# Patient Record
Sex: Male | Born: 1950 | Race: White | Hispanic: No | Marital: Married | Smoking: Never smoker
Health system: Southern US, Community
[De-identification: ages and names within clinical notes are randomized; demographics above are authoritative.]

## PROBLEM LIST (undated history)

## (undated) DIAGNOSIS — K5790 Diverticulosis of intestine, part unspecified, without perforation or abscess without bleeding: Secondary | ICD-10-CM

## (undated) DIAGNOSIS — N401 Enlarged prostate with lower urinary tract symptoms: Secondary | ICD-10-CM

## (undated) DIAGNOSIS — Z8619 Personal history of other infectious and parasitic diseases: Secondary | ICD-10-CM

## (undated) DIAGNOSIS — R55 Syncope and collapse: Secondary | ICD-10-CM

## (undated) DIAGNOSIS — K649 Unspecified hemorrhoids: Secondary | ICD-10-CM

## (undated) DIAGNOSIS — K760 Fatty (change of) liver, not elsewhere classified: Secondary | ICD-10-CM

## (undated) DIAGNOSIS — F419 Anxiety disorder, unspecified: Secondary | ICD-10-CM

## (undated) DIAGNOSIS — C187 Malignant neoplasm of sigmoid colon: Secondary | ICD-10-CM

## (undated) DIAGNOSIS — L570 Actinic keratosis: Secondary | ICD-10-CM

## (undated) DIAGNOSIS — R7303 Prediabetes: Secondary | ICD-10-CM

## (undated) DIAGNOSIS — F329 Major depressive disorder, single episode, unspecified: Secondary | ICD-10-CM

## (undated) DIAGNOSIS — I10 Essential (primary) hypertension: Secondary | ICD-10-CM

## (undated) HISTORY — PX: ELBOW FRACTURE SURGERY: SHX616

## (undated) HISTORY — PX: WISDOM TOOTH EXTRACTION: SHX21

## (undated) HISTORY — DX: Actinic keratosis: L57.0

## (undated) HISTORY — PX: RETINAL DETACHMENT SURGERY: SHX105

## (undated) HISTORY — PX: COLON SURGERY: SHX602

## (undated) HISTORY — PX: LAPAROSCOPIC BOWEL RESECTION: SUR754

## (undated) HISTORY — PX: DIAGNOSTIC LAPAROSCOPY: SUR761

## (undated) HISTORY — DX: Major depressive disorder, single episode, unspecified: F32.9

## (undated) HISTORY — DX: Essential (primary) hypertension: I10

## (undated) HISTORY — PX: EYE SURGERY: SHX253

## (undated) HISTORY — DX: Malignant neoplasm of sigmoid colon: C18.7

## (undated) HISTORY — PX: TONSILLECTOMY: SUR1361

---

## 2007-07-18 ENCOUNTER — Ambulatory Visit: Payer: Self-pay | Admitting: Family Medicine

## 2007-11-12 ENCOUNTER — Ambulatory Visit: Payer: Self-pay | Admitting: Unknown Physician Specialty

## 2008-01-29 DIAGNOSIS — H3322 Serous retinal detachment, left eye: Secondary | ICD-10-CM

## 2008-01-29 HISTORY — DX: Serous retinal detachment, left eye: H33.22

## 2008-10-24 HISTORY — PX: RETINAL DETACHMENT SURGERY: SHX105

## 2008-10-25 ENCOUNTER — Ambulatory Visit: Payer: Self-pay | Admitting: Ophthalmology

## 2009-01-28 HISTORY — PX: CATARACT EXTRACTION W/ INTRAOCULAR LENS IMPLANT: SHX1309

## 2010-01-31 ENCOUNTER — Ambulatory Visit: Payer: Self-pay | Admitting: Ophthalmology

## 2013-11-24 DIAGNOSIS — F32A Depression, unspecified: Secondary | ICD-10-CM | POA: Insufficient documentation

## 2013-11-24 DIAGNOSIS — I152 Hypertension secondary to endocrine disorders: Secondary | ICD-10-CM | POA: Insufficient documentation

## 2013-11-24 DIAGNOSIS — F329 Major depressive disorder, single episode, unspecified: Secondary | ICD-10-CM | POA: Insufficient documentation

## 2013-11-24 DIAGNOSIS — I10 Essential (primary) hypertension: Secondary | ICD-10-CM | POA: Insufficient documentation

## 2013-11-24 HISTORY — DX: Essential (primary) hypertension: I10

## 2013-11-24 HISTORY — DX: Depression, unspecified: F32.A

## 2014-01-28 HISTORY — PX: COLONOSCOPY: SHX174

## 2014-02-24 DIAGNOSIS — Z860100 Personal history of colon polyps, unspecified: Secondary | ICD-10-CM

## 2014-02-24 HISTORY — DX: Personal history of colon polyps, unspecified: Z86.0100

## 2014-02-24 HISTORY — DX: Personal history of colonic polyps: Z86.010

## 2014-03-08 ENCOUNTER — Ambulatory Visit: Payer: Self-pay | Admitting: Gastroenterology

## 2014-04-12 ENCOUNTER — Ambulatory Visit: Payer: Self-pay | Admitting: Surgery

## 2014-04-19 ENCOUNTER — Inpatient Hospital Stay: Payer: Self-pay | Admitting: Surgery

## 2014-05-23 LAB — SURGICAL PATHOLOGY

## 2014-05-29 NOTE — Op Note (Signed)
PATIENT NAME:  Matthew Webster, WADDILL MR#:  [id number] DATE OF BIRTH:  10-01-1950  DATE OF PROCEDURE:  04/19/2014  PREOPERATIVE DIAGNOSIS: Sigmoid colon polyp.   POSTOPERATIVE DIAGNOSIS: Sigmoid colon polyp.  PROCEDURE: Sigmoid colectomy.   SURGEON: Rochel Brome, M.D.   ANESTHESIA: General.   INDICATIONS: This 64 year old male recently had colonoscopy findings of a large polyp of the sigmoid colon. An attempt was made to remove with snare and cautery, but could not be removed that way due to large size of stalk and positioning, and surgery was recommended for definitive treatment.   DESCRIPTION OF PROCEDURE: The patient was placed on the operating table in the supine position under general endotracheal anesthesia. The circulating nurse inserted a Foley urinary catheter with Betadine preparation of the penis draining clear yellow urine. The legs were elevated into the lithotomy position using bumblebee stirrups. The digital rectal exam demonstrated no mass or feces within the rectum. The abdomen was clipped. The abdomen was prepared with ChloraPrep and the perineum was prepared with Betadine solution. The abdomen was draped in a sterile manner.   A lower abdominal midline incision was made, carried down through the subcutaneous tissues to the deep fascia where the fascia was incised in the midline from the umbilicus to the pubic symphysis.  I noted a small umbilical defect and reduced herniated properitoneal fat, and closed the umbilical defect with a 0 Maxon figure-of-eight suture. Next, the peritoneum was incised and opened the peritoneal cavity. The visible intestines appeared typical. The appendix appeared normal. There was a palpable mass in the mid aspect of the sigmoid colon. There was tattooing effect of the bowel at this site. The mass itself was approximately 1.8 cm in dimension according to palpation and was partially mobile within the segment of bowel.  The sigmoid colon was mobilized with  incision of the lateral peritoneal reflection and also identified the left ureter.  The points for proximal and distal margins of resection were selected so that the distal margin was approximately 3 inches and the proximal margin was approximately 3 inches. Windows were created in the mesentery.  The mesenteric dissection was carried out with the Harmonic scalpel. Also, proximally the marginal artery was suture ligated with 2-0 chromic and also a major branch of the inferior mesenteric artery was ligated with 0 chromic suture ligature. The mesenteric dissection was completed.  The 0 chromic stitch was placed in the proximal end of the specimen for the pathologist's orientation. The bowel was divided proximally and distally with electrocautery and the specimen passed off the table. The edges of the remaining bowel were held with Allis clamps. The anastomosis was carried out with triangulation technique of application of the GQ-67 stapler which was first placed along the mesenteric border of the bowel and staples were internal.  Next, the anastomosis was completed with 2 more loads of the TA-30 stapler which were placed externally to complete the anastomosis. The anastomosis did appear to be widely patent. There was one point where there was a small fracture of the muscularis propria which was then repaired with a single 4-0 Vicryl figure-of-eight suture. The mesenteric defect was closed with running 4-0 Vicryl. It was noted that during the course of the procedure a small amount of blood was aspirated. Total blood loss for the case was approximately 40 mL and at the end of the case hemostasis was intact. The site was irrigated with warm saline solution. Next, gowns, gloves, instruments, and towels were exchanged for clean ones. The omentum  was brought beneath the wound. Further inspection revealed hemostasis was intact. The peritoneum was closed with a running 2-0 chromic and the fascia was closed with interrupted 0  Maxon figure-of-eight sutures. The skin was closed with clips and dressings were applied with paper tape using 4 x 4 cotton gauze.    The patient appeared to be in satisfactory condition. The Foley catheter was removed and the patient was prepared for transfer to the recovery room.   ____________________________ Lenna Sciara. Rochel Brome, MD jws:sp D: 04/19/2014 16:58:53 ET T: 04/19/2014 17:23:27 ET JOB#: [id number]  cc: Loreli Dollar, MD, <Dictator> Loreli Dollar MD ELECTRONICALLY SIGNED 04/21/2014 10:28

## 2014-05-29 NOTE — Discharge Summary (Signed)
PATIENT NAME:  Matthew Webster, Matthew Webster MR#:  [id number] DATE OF BIRTH:  10-12-1950  DATE OF ADMISSION:  04/19/2014 DATE OF DISCHARGE:  04/23/2014  HISTORY OF PRESENT ILLNESS: This 64 year old male has a history of colonoscopy findings of a large polyp in the sigmoid colon, which could not be removed with the colonoscope and surgery was recommended for definitive treatment. The patient did have a preoperative bowel preparation at home and came in through the outpatient surgery department prepared for sigmoid colectomy.   PAST MEDICAL HISTORY INCLUDES: Hypertension, depression, fatty liver disease, diverticulosis, glaucoma.   Details are recorded on the typed H and Nelson: The patient did have a preoperative prophylactic antibiotic and was carried to the operating room and had a sigmoid colectomy. Postoperatively, he was treated with IV fluids and subcutaneous prophylactic heparin. He was initially begun on a clear liquid diet, which he tolerated and was advanced to full liquids and eventually advanced to solid food. He did demonstrate bowel function prior to discharge.   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 Adenocarcinoma invaded into the submucosa of the polyp head and the upper half of the stalk. There was no involvement of the bowel wall. There was no cancer found in 13 regional mesenteric lymph nodes. Margins were uninvolved.   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   OPERATION: Sigmoid colectomy.   Discharge instructions were given and plans made for follow-up in the office.     ____________________________ J. Rochel Brome, MD jws:mw D: 05/07/2014 10:10:19 ET T: 05/07/2014 10:31:32 ET JOB#: [id number]  cc: Loreli Dollar, MD, <Dictator> Loreli Dollar MD ELECTRONICALLY SIGNED 05/11/2014 18:12

## 2014-06-20 DIAGNOSIS — N138 Other obstructive and reflux uropathy: Secondary | ICD-10-CM | POA: Insufficient documentation

## 2014-06-20 DIAGNOSIS — N401 Enlarged prostate with lower urinary tract symptoms: Secondary | ICD-10-CM

## 2014-06-20 DIAGNOSIS — C187 Malignant neoplasm of sigmoid colon: Secondary | ICD-10-CM | POA: Insufficient documentation

## 2014-06-20 HISTORY — DX: Malignant neoplasm of sigmoid colon: C18.7

## 2014-08-31 LAB — PLATELET COUNT: Platelet: 241 10*3/uL (ref 150–440)

## 2015-07-21 ENCOUNTER — Other Ambulatory Visit: Payer: Self-pay

## 2015-08-23 ENCOUNTER — Ambulatory Visit: Payer: Self-pay | Admitting: Urology

## 2015-09-20 ENCOUNTER — Ambulatory Visit: Payer: Self-pay | Admitting: Urology

## 2015-10-11 ENCOUNTER — Ambulatory Visit (INDEPENDENT_AMBULATORY_CARE_PROVIDER_SITE_OTHER): Payer: Medicare Other | Admitting: Urology

## 2015-10-11 ENCOUNTER — Encounter: Payer: Self-pay | Admitting: Urology

## 2015-10-11 VITALS — BP 155/108 | HR 69 | Ht 65.0 in | Wt 172.0 lb

## 2015-10-11 DIAGNOSIS — R972 Elevated prostate specific antigen [PSA]: Secondary | ICD-10-CM | POA: Diagnosis not present

## 2015-10-11 DIAGNOSIS — R351 Nocturia: Secondary | ICD-10-CM

## 2015-10-11 NOTE — Progress Notes (Signed)
10/11/2015 5:02 PM   Matthew Webster 1950-03-17 [id number]  Referring provider: No referring provider defined for this encounter.  Chief Complaint  Patient presents with  . Elevated PSA    HPI: 65 yo M with BPH, elevated PSA.  He is currently on Flomax which has helped sigfnicatnly with his urination.  Prior to this medication, he had weak stream, post void dribbling and incomplete bladder emptying.  Since starting this medication, his symptoms are improved inefficiently.  He does get up 2-3x nightly which is bothersome.  He does snore, never had sleep study.    S/p negative prostate biopsy in 04/2013 at which time his PSA was 5.0 ng/DL.  TRUS volume 19 gm.  No family history of prostate cancer.  Most recent PSA 11/16 5.4, PSA 6/17 6.4  PMH: No past medical history on file.  Surgical History: Past Surgical History:  Procedure Laterality Date  . LAPAROSCOPIC BOWEL RESECTION    . RETINAL DETACHMENT SURGERY    . TONSILLECTOMY      Home Medications:    Medication List       Accurate as of 10/11/15 11:59 PM. Always use your most recent med list.          diazepam 5 MG tablet Commonly known as:  VALIUM take 1 tablet by mouth every 12 hours if needed for anxiety   latanoprost 0.005 % ophthalmic solution Commonly known as:  XALATAN   losartan-hydrochlorothiazide 50-12.5 MG tablet Commonly known as:  HYZAAR Take by mouth.   tamsulosin 0.4 MG Caps capsule Commonly known as:  FLOMAX take 1 capsule by mouth once daily TAKE 30 MINUTES AFTER SAME MEAL EACH DAY   timolol 0.5 % ophthalmic solution Commonly known as:  TIMOPTIC instill 1 drop into left eye every morning   venlafaxine XR 150 MG 24 hr capsule Commonly known as:  EFFEXOR-XR       Allergies:  Allergies  Allergen Reactions  . Shellfish Allergy Nausea Only    raw  . Penicillin V Potassium Rash    Family History: Family History  Problem Relation Age of Onset  . CVA Mother   . Diabetes Father     . Prostate cancer Neg Hx   . Bladder Cancer Neg Hx   . Kidney cancer Neg Hx     Social History:  reports that he has never smoked. He has never used smokeless tobacco. He reports that he drinks alcohol. He reports that he does not use drugs.  ROS: UROLOGY Frequent Urination?: No Hard to postpone urination?: No Burning/pain with urination?: No Get up at night to urinate?: Yes Leakage of urine?: No Urine stream starts and stops?: No Trouble starting stream?: No Do you have to strain to urinate?: No Blood in urine?: No Urinary tract infection?: No Sexually transmitted disease?: No Injury to kidneys or bladder?: No Painful intercourse?: No Weak stream?: No Erection problems?: No Penile pain?: No  Gastrointestinal Nausea?: No Vomiting?: No Indigestion/heartburn?: No Diarrhea?: No Constipation?: No  Constitutional Fever: No Night sweats?: No Weight loss?: No Fatigue?: No  Skin Skin rash/lesions?: No Itching?: No  Eyes Blurred vision?: No Double vision?: No  Ears/Nose/Throat Sore throat?: No Sinus problems?: No  Hematologic/Lymphatic Swollen glands?: No Easy bruising?: No  Cardiovascular Leg swelling?: No Chest pain?: No  Respiratory Cough?: No Shortness of breath?: No  Endocrine Excessive thirst?: No  Musculoskeletal Back pain?: No Joint pain?: No  Neurological Headaches?: No Dizziness?: No  Psychologic Depression?: Yes Anxiety?: Yes  Physical Exam:  BP (!) 155/108   Pulse 69   Ht 5\' 5"  (1.651 m)   Wt 172 lb (78 kg)   BMI 28.62 kg/m   Constitutional:  Alert and oriented, No acute distress. HEENT: [place] AT, moist mucus membranes.  Trachea midline, no masses. Cardiovascular: No clubbing, cyanosis, or edema. Respiratory: Normal respiratory effort, no increased work of breathing. GI: Abdomen is soft, nontender, nondistended, no abdominal masses GU: No CVA tenderness.  DRE: Normal sphincter tone.  30 cc prostate, nontender, no nodules.    Skin: No rashes, bruises or suspicious lesions. Neurologic: Grossly intact, no focal deficits, moving all 4 extremities. Psychiatric: Normal mood and affect.  Laboratory Data: Lab Results  Component Value Date   PLT 241 04/20/2014   Comprehensive Metabolic Panel (CMP) (Q000111Q 12:45 PM) Comprehensive Metabolic Panel (CMP) (Q000111Q 12:45 PM)  Component Value Ref Range  Glucose 57 (L) 70 - 110 mg/dL  Sodium 137 136 - 145 mmol/L  Potassium 3.9 3.6 - 5.1 mmol/L  Chloride 96 (L) 97 - 109 mmol/L  Carbon Dioxide (CO2) 34.2 (H) 22.0 - 32.0 mmol/L  Urea Nitrogen (BUN) 19 7 - 25 mg/dL  Creatinine 1.3 0.7 - 1.3 mg/dL     Results for orders placed or performed in visit on 10/11/15  PSA  Result Value Ref Range   Prostate Specific Ag, Serum 7.9 (H) 0.0 - 4.0 ng/mL    Pertinent Imaging: n/a  Assessment & Plan:    1. Elevated PSA Most recent PSA up to 7.9. Discussed concern for underlying malignancy given its stairstepping trend.  In addition, chest volume only 19 cc making his PSA density higher than expected.  Rectal exam today fairly unremarkable.  Recommend either repeat biopsy or endorectal MRI with potential for MRI fusion guided biopsy as deemed necessary based on results. Risks and benefits of each were discussed. He has had a negative biopsy back in 2015.  He is most interested in proceeding with endorectal MRI.  - PSA  2. Nocturia Primary urinary symptoms include nocturia. He is a snorer and has never had a sleep study. Discussed relationship between isolated nocturia and untreated sleep apnea. He may discuss this further with his primary care physician.  Return in about 4 weeks (around 11/08/2015) for f/u MRI prostate.  Hollice Espy, MD  [facility] [address], [place], [place] [address] [phone]

## 2015-10-12 LAB — PSA: Prostate Specific Ag, Serum: 7.9 ng/mL — ABNORMAL HIGH (ref 0.0–4.0)

## 2015-10-17 ENCOUNTER — Telehealth: Payer: Self-pay | Admitting: Urology

## 2015-10-17 NOTE — Telephone Encounter (Signed)
-----   Message from Hollice Espy, MD sent at 10/16/2015  5:03 PM EDT ----- Discussed PSA results on the phone today. I place an order for a prostate MRI. Please reschedule his follow-up after his prostate MRI in approximately 4 weeks.  Hollice Espy, MD

## 2015-10-17 NOTE — Telephone Encounter (Signed)
Both appointments have been scheduled   Thanks,  Sharyn Lull

## 2015-11-09 ENCOUNTER — Ambulatory Visit (HOSPITAL_COMMUNITY)
Admission: RE | Admit: 2015-11-09 | Discharge: 2015-11-09 | Disposition: A | Discharge status: Home / Self Care | Payer: Medicare Other | Source: Ambulatory Visit | Attending: Urology | Admitting: Urology

## 2015-11-09 DIAGNOSIS — R972 Elevated prostate specific antigen [PSA]: Secondary | ICD-10-CM | POA: Diagnosis not present

## 2015-11-09 LAB — POCT I-STAT CREATININE: CREATININE: 1.1 mg/dL (ref 0.61–1.24)

## 2015-11-09 MED ORDER — GADOBENATE DIMEGLUMINE 529 MG/ML IV SOLN
20.0000 mL | Freq: Once | INTRAVENOUS | Status: AC | PRN
  Administered 2015-11-09: 16 mL via INTRAVENOUS

## 2015-11-10 ENCOUNTER — Ambulatory Visit: Payer: Medicare Other | Admitting: Urology

## 2015-11-16 ENCOUNTER — Ambulatory Visit (INDEPENDENT_AMBULATORY_CARE_PROVIDER_SITE_OTHER): Payer: Medicare Other | Admitting: Urology

## 2015-11-16 VITALS — BP 163/101 | HR 62 | Ht 65.0 in | Wt 171.0 lb

## 2015-11-16 DIAGNOSIS — R972 Elevated prostate specific antigen [PSA]: Secondary | ICD-10-CM

## 2015-11-16 DIAGNOSIS — R351 Nocturia: Secondary | ICD-10-CM | POA: Diagnosis not present

## 2015-11-16 NOTE — Progress Notes (Signed)
11/16/2015 4:18 PM   [name] [birth year] [id number]  Referring provider: Juluis Pitch, MD [phone] [place], [place] [address]  Chief Complaint  Patient presents with  . Elevated PSA    MRI results    HPI: 65 yo M with BPH, elevated and rising PSA who returns today for f/u prostate MRI.  He is s/p negative prostate biopsy in 04/2013 at which time his PSA was 5.0 ng/DL.  TRUS volume 19 gm.  Previous rectal exams have been unremarkable.   No family history of prostate cancer.  PSA trend: 5.4  11/16 6.4  6/17 7.9  9/17  Prostate MRI negative for any Evidence of high-grade macroscopic tumor, invasion, or lymphadenopathy.  He is currently on Flomax which has helped sigfnicatnly with his urination.  Prior to this medication, he had weak stream, post void dribbling and incomplete bladder emptying.  Since starting this medication, his symptoms are improved inefficiently.  He does get up 2-3x nightly which is bothersome.  He does snore, never had sleep study.   PMH: No past medical history on file.  Surgical History: Past Surgical History:  Procedure Laterality Date  . LAPAROSCOPIC BOWEL RESECTION    . RETINAL DETACHMENT SURGERY    . TONSILLECTOMY      Home Medications:    Medication List       Accurate as of 11/16/15  4:18 PM. Always use your most recent med list.          diazepam 5 MG tablet Commonly known as:  VALIUM take 1 tablet by mouth every 12 hours if needed for anxiety   latanoprost 0.005 % ophthalmic solution Commonly known as:  XALATAN   losartan-hydrochlorothiazide 50-12.5 MG tablet Commonly known as:  HYZAAR Take by mouth.   sildenafil 20 MG tablet Commonly known as:  REVATIO TAKE 1-3 TABLETS BY MOUTH AS NEEDED   tamsulosin 0.4 MG Caps capsule Commonly known as:  FLOMAX take 1 capsule by mouth once daily TAKE 30 MINUTES AFTER SAME MEAL EACH DAY   timolol 0.5 % ophthalmic solution Commonly known as:  TIMOPTIC instill 1 drop into  left eye every morning   venlafaxine XR 150 MG 24 hr capsule Commonly known as:  EFFEXOR-XR       Allergies:  Allergies  Allergen Reactions  . Shellfish Allergy Nausea Only    raw  . Penicillin V Potassium Rash    Family History: Family History  Problem Relation Age of Onset  . CVA Mother   . Diabetes Father   . Prostate cancer Neg Hx   . Bladder Cancer Neg Hx   . Kidney cancer Neg Hx     Social History:  reports that he has never smoked. He has never used smokeless tobacco. He reports that he drinks alcohol. He reports that he does not use drugs.  ROS: UROLOGY Frequent Urination?: No Hard to postpone urination?: No Burning/pain with urination?: No Get up at night to urinate?: No Leakage of urine?: No Urine stream starts and stops?: No Trouble starting stream?: No Do you have to strain to urinate?: No Blood in urine?: No Urinary tract infection?: No Sexually transmitted disease?: No Injury to kidneys or bladder?: No Painful intercourse?: No Weak stream?: No Erection problems?: No Penile pain?: No  Gastrointestinal Nausea?: No Vomiting?: No Indigestion/heartburn?: No Diarrhea?: No Constipation?: No  Constitutional Fever: No Night sweats?: No Weight loss?: No Fatigue?: No  Skin Skin rash/lesions?: No Itching?: No  Eyes Blurred vision?: No Double vision?:  No  Ears/Nose/Throat Sore throat?: No Sinus problems?: No  Hematologic/Lymphatic Swollen glands?: No Easy bruising?: No  Cardiovascular Leg swelling?: No Chest pain?: No  Respiratory Cough?: No Shortness of breath?: No  Endocrine Excessive thirst?: No  Musculoskeletal Back pain?: No Joint pain?: No  Neurological Headaches?: No Dizziness?: No  Psychologic Depression?: Yes Anxiety?: Yes  Physical Exam: BP (!) 163/101   Pulse 62   Ht 5\' 5"  (1.651 m)   Wt 171 lb (77.6 kg)   BMI 28.46 kg/m   Constitutional:  Alert and oriented, No acute distress. HEENT: [place] AT, moist  mucus membranes.  Trachea midline, no masses. Cardiovascular: No clubbing, cyanosis, or edema. Respiratory: Normal respiratory effort, no increased work of breathing. Skin: No rashes, bruises or suspicious lesions. Neurologic: Grossly intact, no focal deficits, moving all 4 extremities. Psychiatric: Normal mood and affect.  Laboratory Data: Lab Results  Component Value Date   PLT 241 04/20/2014   Comprehensive Metabolic Panel (CMP) (Q000111Q 12:45 PM) Comprehensive Metabolic Panel (CMP) (Q000111Q 12:45 PM)  Component Value Ref Range  Glucose 57 (L) 70 - 110 mg/dL  Sodium 137 136 - 145 mmol/L  Potassium 3.9 3.6 - 5.1 mmol/L  Chloride 96 (L) 97 - 109 mmol/L  Carbon Dioxide (CO2) 34.2 (H) 22.0 - 32.0 mmol/L  Urea Nitrogen (BUN) 19 7 - 25 mg/dL  Creatinine 1.3 0.7 - 1.3 mg/dL     Results for orders placed or performed during the hospital encounter of 11/09/15  I-STAT creatinine  Result Value Ref Range   Creatinine, Ser 1.10 0.61 - 1.24 mg/dL   PSA trend as above.   Pertinent Imaging: CLINICAL DATA:  Rising PSA levels. Previous negative prostate biopsy in 2015.  EXAM: MR PROSTATE WITHOUT AND WITH CONTRAST  TECHNIQUE: Multiplanar multisequence MRI images were obtained of the pelvis centered about the prostate. Pre and post contrast images were obtained.  CONTRAST:  66mL MULTIHANCE GADOBENATE DIMEGLUMINE 529 MG/ML IV SOLN  COMPARISON:  None.  FINDINGS: Prostate: Mildly enlarged. Mild central gland enlargement with multiple small BPH nodules. No suspicious T2 hypointense central gland nodules identified.  The peripheral zone shows indistinct hypointensity on ADC, however no focal ADC hypointense nodules or suspicious appearing T2 hypointense nodules are identified.  Transcapsular spread:  Absent  Seminal vesicle involvement: Absent  Neurovascular bundle involvement: Absent  Pelvic adenopathy: Absent  Bone metastasis: Absent  Other findings:  Small bilateral inguinal hernias containing only fat.  IMPRESSION: No radiographic evidence of high-grade macroscopic prostate carcinoma. PI-RADS category 2.   Electronically Signed   By: Earle Gell M.D.   On: 11/09/2015 13:53  I was reviewed today personally and with the patient.   Assessment & Plan:    1. Elevated PSA Most recent PSA up to 7.9. Discussed concern for underlying malignancy given its stairstepping trend.  In addition, chest volume only 19 cc making his PSA density higher than expected.  Rectal exam today fairly unremarkable.  Prostate MRI negative for any macroscopic high-grade tumor.  Options moving forward discussed in detail. These include repeat prostate biopsy, continue to follow his PSA closely and reevaluate, versus proceeding with other risk stratification assays.  At this point, he is understood repeating his PSA in approximate 4 months which will be 6 months from his most recent value of 7.9. If his PSA continues to rise, I will strongly recommend prostate biopsy at that time.  - PSA  2. Nocturia Continue to recommend follow-up with PCP for possible sleep study.   Return in about  4 months (around 03/18/2016) for PSA (ideally few days prior)/ DRE.  Hollice Espy, MD  [facility] [address], [place], [place] [address] [phone]

## 2016-03-14 ENCOUNTER — Other Ambulatory Visit: Payer: Medicare Other

## 2016-03-20 ENCOUNTER — Ambulatory Visit: Payer: Medicare Other | Admitting: Urology

## 2016-04-10 ENCOUNTER — Other Ambulatory Visit: Payer: Medicare Other

## 2016-04-10 ENCOUNTER — Ambulatory Visit: Payer: Medicare Other | Admitting: Urology

## 2016-04-15 ENCOUNTER — Other Ambulatory Visit: Payer: Medicare Other

## 2016-04-15 DIAGNOSIS — R972 Elevated prostate specific antigen [PSA]: Secondary | ICD-10-CM

## 2016-04-16 LAB — PSA: PROSTATE SPECIFIC AG, SERUM: 7.7 ng/mL — AB (ref 0.0–4.0)

## 2016-04-17 ENCOUNTER — Ambulatory Visit (INDEPENDENT_AMBULATORY_CARE_PROVIDER_SITE_OTHER): Payer: Medicare Other | Admitting: Urology

## 2016-04-17 ENCOUNTER — Encounter: Payer: Self-pay | Admitting: Urology

## 2016-04-17 VITALS — BP 158/96 | HR 53 | Ht 65.0 in | Wt 175.0 lb

## 2016-04-17 DIAGNOSIS — R972 Elevated prostate specific antigen [PSA]: Secondary | ICD-10-CM | POA: Diagnosis not present

## 2016-04-17 DIAGNOSIS — R351 Nocturia: Secondary | ICD-10-CM

## 2016-04-17 DIAGNOSIS — N138 Other obstructive and reflux uropathy: Secondary | ICD-10-CM

## 2016-04-17 DIAGNOSIS — N401 Enlarged prostate with lower urinary tract symptoms: Secondary | ICD-10-CM | POA: Diagnosis not present

## 2016-04-17 NOTE — Progress Notes (Signed)
04/17/2016 9:02 PM   Matthew Webster 1950/12/11 [id number]  Referring provider: Juluis Pitch, MD [phone] [place], [place] [address]  Chief Complaint  Patient presents with  . Elevated PSA    4 month    HPI: 66 yo M with BPH, elevated/ rising PSA who returns for PSA.  PSA stable today, trend below.  He is s/p negative prostate biopsy in 04/2013 at which time his PSA was 5.0 ng/DL.  TRUS volume 19 gm.  Previous rectal exams have been unremarkable.   No family history of prostate cancer.  PSA trend: 5.4  11/16 6.4  6/17 7.9  9/17 7.7  3/18  Prostate MRI 10/2015 negative for any evidence of high-grade macroscopic tumor, invasion, or lymphadenopathy.  He is currently on Flomax which has helped significantly with his urination.  Prior to this medication, he had weak stream, post void dribbling and incomplete bladder emptying.  Since starting this medication, his symptoms are improved inefficiently.  He does get up 2-3x nightly which is bothersome, but drinks a beer before bed.  PMH: Past Medical History:  Diagnosis Date  . 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 Depression 11/24/2013   Overview:  Questionable bipolar  . Hypertension 11/24/2013    Surgical History: Past Surgical History:  Procedure Laterality Date  . LAPAROSCOPIC BOWEL RESECTION    . RETINAL DETACHMENT SURGERY    . TONSILLECTOMY      Home Medications:  Allergies as of 04/17/2016      Reactions   Shellfish Allergy Nausea Only   raw   Penicillin V Potassium Rash      Medication List       Accurate as of 04/17/16  9:02 PM. Always use your most recent med list.          diazepam 5 MG tablet Commonly known as:  VALIUM take 1 tablet by mouth every 12 hours if needed for anxiety   latanoprost 0.005 % ophthalmic solution Commonly known as:  XALATAN   losartan-hydrochlorothiazide 50-12.5 MG tablet Commonly known as:  HYZAAR Take by mouth.   sildenafil 20 MG tablet Commonly known  as:  REVATIO TAKE 1-3 TABLETS BY MOUTH AS NEEDED   tamsulosin 0.4 MG Caps capsule Commonly known as:  FLOMAX take 1 capsule by mouth once daily TAKE 30 MINUTES AFTER SAME MEAL EACH DAY   timolol 0.5 % ophthalmic solution Commonly known as:  TIMOPTIC instill 1 drop into left eye every morning   venlafaxine XR 150 MG 24 hr capsule Commonly known as:  EFFEXOR-XR       Allergies:  Allergies  Allergen Reactions  . Shellfish Allergy Nausea Only    raw  . Penicillin V Potassium Rash    Family History: Family History  Problem Relation Age of Onset  . CVA Mother   . Diabetes Father   . Prostate cancer Neg Hx   . Bladder Cancer Neg Hx   . Kidney cancer Neg Hx     Social History:  reports that he has never smoked. He has never used smokeless tobacco. He reports that he drinks alcohol. He reports that he does not use drugs.  ROS: UROLOGY Frequent Urination?: No Hard to postpone urination?: No Burning/pain with urination?: No Get up at night to urinate?: Yes Leakage of urine?: No Urine stream starts and stops?: No Trouble starting stream?: No Do you have to strain to urinate?: No Blood in urine?: No Urinary tract infection?: No Sexually transmitted disease?: No Injury  to kidneys or bladder?: No Painful intercourse?: No Weak stream?: No Erection problems?: No Penile pain?: No  Gastrointestinal Nausea?: No Vomiting?: No Indigestion/heartburn?: No Diarrhea?: No Constipation?: No  Constitutional Fever: No Night sweats?: No Weight loss?: No Fatigue?: No  Skin Skin rash/lesions?: No Itching?: No  Eyes Blurred vision?: No Double vision?: No  Ears/Nose/Throat Sore throat?: No Sinus problems?: No  Hematologic/Lymphatic Swollen glands?: No Easy bruising?: No  Cardiovascular Leg swelling?: No Chest pain?: No  Respiratory Cough?: No Shortness of breath?: No  Endocrine Excessive thirst?: No  Musculoskeletal Back pain?: No Joint pain?:  No  Neurological Headaches?: No Dizziness?: No  Psychologic Depression?: No Anxiety?: Yes  Physical Exam: BP (!) 158/96   Pulse (!) 53   Ht 5\' 5"  (1.651 m)   Wt 175 lb (79.4 kg)   BMI 29.12 kg/m   Constitutional:  Alert and oriented, No acute distress. HEENT: [place] AT, moist mucus membranes.  Trachea midline, no masses. Cardiovascular: No clubbing, cyanosis, or edema. Respiratory: Normal respiratory effort, no increased work of breathing. Skin: No rashes, bruises or suspicious lesions. Rectal exam today deferred, last performed on 09/2015 unremarkable Neurologic: Grossly intact, no focal deficits, moving all 4 extremities. Psychiatric: Normal mood and affect.  Laboratory Data: Lab Results  Component Value Date   PLT 241 04/20/2014   Comprehensive Metabolic Panel (CMP) (95/63/8756 12:45 PM) Comprehensive Metabolic Panel (CMP) (43/32/9518 12:45 PM)  Component Value Ref Range  Glucose 57 (L) 70 - 110 mg/dL  Sodium 137 136 - 145 mmol/L  Potassium 3.9 3.6 - 5.1 mmol/L  Chloride 96 (L) 97 - 109 mmol/L  Carbon Dioxide (CO2) 34.2 (H) 22.0 - 32.0 mmol/L  Urea Nitrogen (BUN) 19 7 - 25 mg/dL  Creatinine 1.3 0.7 - 1.3 mg/dL     Results for orders placed or performed in visit on 04/15/16  PSA  Result Value Ref Range   Prostate Specific Ag, Serum 7.7 (H) 0.0 - 4.0 ng/mL   PSA trend as above.   Pertinent Imaging: No new imaging  Assessment & Plan:    1. Elevated PSA PSA stable today but remains elevated Status post negative prostate biopsy in MRI I have recommended continued close surveillance with PSA/DRE in 6 months  2. Nocturia Likely behavioral    3. BPH with obstruction   Continue flomax. Well controlled.  Return in about 6 months (around 10/18/2016) for DRE/ PSA.  Hollice Espy, MD  [facility] [address], [place],  [address] [phone]

## 2016-05-09 ENCOUNTER — Ambulatory Visit: Payer: Self-pay | Attending: Family Medicine | Admitting: Occupational Therapy

## 2016-05-09 DIAGNOSIS — M25521 Pain in right elbow: Secondary | ICD-10-CM | POA: Insufficient documentation

## 2016-05-09 DIAGNOSIS — M79645 Pain in left finger(s): Secondary | ICD-10-CM | POA: Insufficient documentation

## 2016-05-09 DIAGNOSIS — M25522 Pain in left elbow: Secondary | ICD-10-CM | POA: Insufficient documentation

## 2016-05-09 DIAGNOSIS — M79644 Pain in right finger(s): Secondary | ICD-10-CM | POA: Insufficient documentation

## 2016-05-09 NOTE — Therapy (Signed)
[facility] [address] [place], [address] Phone: [phone]   Fax:  [phone]  Occupational Therapy Screen  Patient Details  Name: Matthew Webster MRN: [id number] Date of Birth: [birth year] No Data Recorded  Encounter Date: 05/09/2016    Past Medical History:  Diagnosis Date  . 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 Depression 11/24/2013   Overview:  Questionable bipolar  . Hypertension 11/24/2013    Past Surgical History:  Procedure Laterality Date  . LAPAROSCOPIC BOWEL RESECTION    . RETINAL DETACHMENT SURGERY    . TONSILLECTOMY      There were no vitals filed for this visit.  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  Pt present with over use of bicep, brachialis causing some elbow pain. Then performing tight and sustained grip , and pushing down with extended digits on screen - causing some pain over MC's. Pt tight in upper traps and scalenes - with decrease cervical lateral flexion and rotation.    Pt was ed on modifications to tasks -and maybe not doing the [place] job on top of his regular job. It is just to physical and all on the UE and cervical.   Pt to do contrast baths for hand pain and modify to avoid tight and sustained grip / use larger joints.  avoid over use and lifting /carrying on elbow flexors. For cervical - pt to do stretches after heat for upper traps and scalenes.  and ed on doing chin tucks and scapula squeezes.   Pt to do HEP for 2 wks - and if no improvement -follow up with PCP and maybe get PT referral.                                       Patient will  benefit from skilled therapeutic intervention in order to improve the following deficits and impairments:     Visit Diagnosis: Pain in right finger(s)  Pain in left finger(s)  Pain in left elbow  Pain in right elbow    Problem List Patient Active Problem List   Diagnosis Date Noted  . Enlarged prostate with urinary obstruction 06/20/2014  . 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 Depression 11/24/2013  . Hypertension 11/24/2013    Rosalyn Gess OTR/L,CLT 05/09/2016, 7:50 PM  [facility] [address], [place], [address] Phone: [phone]   Fax:  [phone]  Name: Matthew Webster MRN: [id number] Date of Birth: 09-11-1950

## 2016-10-10 ENCOUNTER — Other Ambulatory Visit: Payer: Medicare Other

## 2016-10-10 DIAGNOSIS — R972 Elevated prostate specific antigen [PSA]: Secondary | ICD-10-CM

## 2016-10-11 LAB — PSA: PROSTATE SPECIFIC AG, SERUM: 5.8 ng/mL — AB (ref 0.0–4.0)

## 2016-10-16 ENCOUNTER — Encounter: Payer: Self-pay | Admitting: Urology

## 2016-10-16 ENCOUNTER — Ambulatory Visit (INDEPENDENT_AMBULATORY_CARE_PROVIDER_SITE_OTHER): Payer: Medicare Other | Admitting: Urology

## 2016-10-16 VITALS — BP 139/86 | HR 55 | Ht 65.0 in | Wt 170.0 lb

## 2016-10-16 DIAGNOSIS — R972 Elevated prostate specific antigen [PSA]: Secondary | ICD-10-CM | POA: Diagnosis not present

## 2016-10-16 DIAGNOSIS — N401 Enlarged prostate with lower urinary tract symptoms: Secondary | ICD-10-CM

## 2016-10-16 DIAGNOSIS — N138 Other obstructive and reflux uropathy: Secondary | ICD-10-CM

## 2016-10-16 NOTE — Progress Notes (Signed)
10/16/2016 9:15 PM   Matthew Webster [birth year] [id number]  Referring provider: Juluis Pitch, MD [phone] [place], [place] [address]  Chief Complaint  Patient presents with  . Elevated PSA    HPI: 66 yo M with BPH, elevated/ rising PSA who returns for PSA/ DRE.  He is s/p negative prostate biopsy in 04/2013 at which time his PSA was 5.0 ng/DL.  TRUS volume 19 gm.  Previous rectal exams have been unremarkable.   No family history of prostate cancer.  PSA trend: 5.8 9/18 5.4  11/16 6.4  6/17 7.9  9/17 7.7  3/18 5.8 9/18  Prostate MRI 10/2015 negative for any evidence of high-grade macroscopic tumor, invasion, or lymphadenopathy.  He is currently on Flomax which has helped significantly with his urination.  Prior to this medication, he had weak stream, post void dribbling and incomplete bladder emptying.  Since starting this medication, his symptoms are improved inefficiently. This is unchanged from last visit.    PMH: Past Medical History:  Diagnosis Date  . 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 Depression 11/24/2013   Overview:  Questionable bipolar  . Hypertension 11/24/2013    Surgical History: Past Surgical History:  Procedure Laterality Date  . LAPAROSCOPIC BOWEL RESECTION    . RETINAL DETACHMENT SURGERY    . TONSILLECTOMY      Home Medications:  Allergies as of 10/16/2016      Reactions   Shellfish Allergy Nausea Only   raw   Penicillin V Potassium Rash      Medication List       Accurate as of 10/16/16 11:59 PM. Always use your most recent med list.          diazepam 5 MG tablet Commonly known as:  VALIUM take 1 tablet by mouth every 12 hours if needed for anxiety   latanoprost 0.005 % ophthalmic solution Commonly known as:  XALATAN   losartan-hydrochlorothiazide 50-12.5 MG tablet Commonly known as:  HYZAAR Take by mouth.   tamsulosin 0.4 MG Caps capsule Commonly known as:  FLOMAX take 1 capsule by mouth once daily TAKE 30  MINUTES AFTER SAME MEAL EACH DAY   timolol 0.5 % ophthalmic solution Commonly known as:  TIMOPTIC instill 1 drop into left eye every morning   venlafaxine XR 150 MG 24 hr capsule Commonly known as:  EFFEXOR-XR       Allergies:  Allergies  Allergen Reactions  . Shellfish Allergy Nausea Only    raw  . Penicillin V Potassium Rash    Family History: Family History  Problem Relation Age of Onset  . CVA Mother   . Diabetes Father   . Prostate cancer Neg Hx   . Bladder Cancer Neg Hx   . Kidney cancer Neg Hx     Social History:  reports that he has never smoked. He has never used smokeless tobacco. He reports that he drinks alcohol. He reports that he does not use drugs.  ROS: UROLOGY Frequent Urination?: No Hard to postpone urination?: No Burning/pain with urination?: No Get up at night to urinate?: No Leakage of urine?: No Urine stream starts and stops?: No Trouble starting stream?: No Do you have to strain to urinate?: No Blood in urine?: No Urinary tract infection?: No Sexually transmitted disease?: No Injury to kidneys or bladder?: No Painful intercourse?: No Weak stream?: No Erection problems?: No Penile pain?: No  Gastrointestinal Nausea?: No Vomiting?: No Indigestion/heartburn?: No Diarrhea?: No Constipation?: No  Constitutional Fever: No  Night sweats?: No Weight loss?: No Fatigue?: No  Skin Skin rash/lesions?: No Itching?: No  Eyes Blurred vision?: No Double vision?: No  Ears/Nose/Throat Sore throat?: No Sinus problems?: No  Hematologic/Lymphatic Swollen glands?: No Easy bruising?: No  Cardiovascular Leg swelling?: No Chest pain?: No  Respiratory Cough?: No Shortness of breath?: No  Endocrine Excessive thirst?: No  Musculoskeletal Back pain?: No Joint pain?: No  Neurological Headaches?: No Dizziness?: No  Psychologic Depression?: Yes Anxiety?: Yes  Physical Exam: BP 139/86 (BP Location: Left Arm, Patient  Position: Sitting, Cuff Size: Normal)   Pulse (!) 55   Ht 5\' 5"  (1.651 m)   Wt 170 lb (77.1 kg)   BMI 28.29 kg/m   Constitutional:  Alert and oriented, No acute distress. HEENT: [place] AT, moist mucus membranes.  Trachea midline, no masses. Cardiovascular: No clubbing, cyanosis, or edema. Respiratory: Normal respiratory effort, no increased work of breathing. Skin: No rashes, bruises or suspicious lesions. Rectal exam: Normal sphincter tone, 30 cc prostate, nontender, no nodules. Neurologic: Grossly intact, no focal deficits, moving all 4 extremities. Psychiatric: Normal mood and affect.  Laboratory Data: Cr 1.1 on 05/2016  Results for orders placed or performed in visit on 10/10/16  PSA  Result Value Ref Range   Prostate Specific Ag, Serum 5.8 (H) 0.0 - 4.0 ng/mL   PSA trend as above.   Pertinent Imaging: n/a  Assessment & Plan:    1. Elevated PSA PSA continued to be elevated but within range of previous fluctuation  Status post negative prostate biopsy in MRI Recommended continued close surveillance with PSA/DRE in 12 months   3. BPH with obstruction   Continue flomax Doing well  Return in about 1 year (around 10/16/2017) for psa/ dre.  Hollice Espy, MD  [facility] [address] [place], [place] [address] [phone]

## 2016-12-24 DIAGNOSIS — Z85828 Personal history of other malignant neoplasm of skin: Secondary | ICD-10-CM

## 2016-12-24 HISTORY — DX: Personal history of other malignant neoplasm of skin: Z85.828

## 2017-04-15 ENCOUNTER — Other Ambulatory Visit: Payer: Self-pay | Admitting: Gastroenterology

## 2017-04-15 DIAGNOSIS — F1020 Alcohol dependence, uncomplicated: Secondary | ICD-10-CM

## 2017-04-21 ENCOUNTER — Ambulatory Visit
Admission: RE | Admit: 2017-04-21 | Discharge: 2017-04-21 | Disposition: A | Discharge status: Home / Self Care | Payer: Medicare Other | Source: Ambulatory Visit | Attending: Gastroenterology | Admitting: Gastroenterology

## 2017-04-21 DIAGNOSIS — F1020 Alcohol dependence, uncomplicated: Secondary | ICD-10-CM | POA: Diagnosis not present

## 2017-04-21 DIAGNOSIS — K760 Fatty (change of) liver, not elsewhere classified: Secondary | ICD-10-CM | POA: Diagnosis not present

## 2017-04-21 DIAGNOSIS — N281 Cyst of kidney, acquired: Secondary | ICD-10-CM | POA: Diagnosis not present

## 2017-07-22 ENCOUNTER — Encounter
Admission: RE | Disposition: A | Discharge status: Home / Self Care | Payer: Self-pay | Source: Ambulatory Visit | Attending: Gastroenterology

## 2017-07-22 ENCOUNTER — Ambulatory Visit
Admission: RE | Admit: 2017-07-22 | Discharge: 2017-07-22 | Disposition: A | Discharge status: Home / Self Care | Payer: Medicare Other | Source: Ambulatory Visit | Attending: Gastroenterology | Admitting: Gastroenterology

## 2017-07-22 ENCOUNTER — Ambulatory Visit: Payer: Medicare Other | Admitting: Anesthesiology

## 2017-07-22 ENCOUNTER — Encounter: Payer: Self-pay | Admitting: Anesthesiology

## 2017-07-22 DIAGNOSIS — Z85038 Personal history of other malignant neoplasm of large intestine: Secondary | ICD-10-CM | POA: Diagnosis not present

## 2017-07-22 DIAGNOSIS — Z1211 Encounter for screening for malignant neoplasm of colon: Secondary | ICD-10-CM | POA: Diagnosis not present

## 2017-07-22 DIAGNOSIS — Z885 Allergy status to narcotic agent status: Secondary | ICD-10-CM | POA: Insufficient documentation

## 2017-07-22 DIAGNOSIS — Z79899 Other long term (current) drug therapy: Secondary | ICD-10-CM | POA: Diagnosis not present

## 2017-07-22 DIAGNOSIS — N400 Enlarged prostate without lower urinary tract symptoms: Secondary | ICD-10-CM | POA: Insufficient documentation

## 2017-07-22 DIAGNOSIS — Z91013 Allergy to seafood: Secondary | ICD-10-CM | POA: Insufficient documentation

## 2017-07-22 DIAGNOSIS — Z9842 Cataract extraction status, left eye: Secondary | ICD-10-CM | POA: Insufficient documentation

## 2017-07-22 DIAGNOSIS — Z880 Allergy status to penicillin: Secondary | ICD-10-CM | POA: Diagnosis not present

## 2017-07-22 DIAGNOSIS — D127 Benign neoplasm of rectosigmoid junction: Secondary | ICD-10-CM | POA: Insufficient documentation

## 2017-07-22 DIAGNOSIS — K760 Fatty (change of) liver, not elsewhere classified: Secondary | ICD-10-CM | POA: Insufficient documentation

## 2017-07-22 DIAGNOSIS — K635 Polyp of colon: Secondary | ICD-10-CM | POA: Diagnosis not present

## 2017-07-22 DIAGNOSIS — D122 Benign neoplasm of ascending colon: Secondary | ICD-10-CM | POA: Insufficient documentation

## 2017-07-22 DIAGNOSIS — F329 Major depressive disorder, single episode, unspecified: Secondary | ICD-10-CM | POA: Insufficient documentation

## 2017-07-22 DIAGNOSIS — Z980 Intestinal bypass and anastomosis status: Secondary | ICD-10-CM | POA: Diagnosis not present

## 2017-07-22 DIAGNOSIS — I10 Essential (primary) hypertension: Secondary | ICD-10-CM | POA: Diagnosis not present

## 2017-07-22 HISTORY — DX: Fatty (change of) liver, not elsewhere classified: K76.0

## 2017-07-22 HISTORY — DX: Benign prostatic hyperplasia with lower urinary tract symptoms: N40.1

## 2017-07-22 HISTORY — PX: COLONOSCOPY WITH PROPOFOL: SHX5780

## 2017-07-22 SURGERY — COLONOSCOPY WITH PROPOFOL
Anesthesia: General

## 2017-07-22 MED ORDER — MIDAZOLAM HCL 2 MG/2ML IJ SOLN
INTRAMUSCULAR | Status: DC | PRN
  Administered 2017-07-22: 2 mg via INTRAVENOUS

## 2017-07-22 MED ORDER — EPHEDRINE SULFATE 50 MG/ML IJ SOLN
INTRAMUSCULAR | Status: AC
  Filled 2017-07-22: qty 1

## 2017-07-22 MED ORDER — PROPOFOL 500 MG/50ML IV EMUL
INTRAVENOUS | Status: DC | PRN
  Administered 2017-07-22: 120 ug/kg/min via INTRAVENOUS

## 2017-07-22 MED ORDER — LIDOCAINE HCL (PF) 1 % IJ SOLN
2.0000 mL | Freq: Once | INTRAMUSCULAR | Status: AC
  Administered 2017-07-22: 0.3 mL via INTRADERMAL

## 2017-07-22 MED ORDER — LIDOCAINE HCL (PF) 1 % IJ SOLN
INTRAMUSCULAR | Status: AC
  Administered 2017-07-22: 0.3 mL via INTRADERMAL
  Filled 2017-07-22: qty 2

## 2017-07-22 MED ORDER — PROPOFOL 500 MG/50ML IV EMUL
INTRAVENOUS | Status: AC
  Filled 2017-07-22: qty 50

## 2017-07-22 MED ORDER — FENTANYL CITRATE (PF) 100 MCG/2ML IJ SOLN
INTRAMUSCULAR | Status: AC
  Filled 2017-07-22: qty 2

## 2017-07-22 MED ORDER — SODIUM CHLORIDE 0.9 % IV SOLN
INTRAVENOUS | Status: DC
  Administered 2017-07-22: 1000 mL via INTRAVENOUS

## 2017-07-22 MED ORDER — FENTANYL CITRATE (PF) 100 MCG/2ML IJ SOLN
INTRAMUSCULAR | Status: DC | PRN
  Administered 2017-07-22: 50 ug via INTRAVENOUS

## 2017-07-22 MED ORDER — SODIUM CHLORIDE 0.9 % IV SOLN
INTRAVENOUS | Status: DC
  Administered 2017-07-22: 13:00:00 via INTRAVENOUS

## 2017-07-22 MED ORDER — MIDAZOLAM HCL 2 MG/2ML IJ SOLN
INTRAMUSCULAR | Status: AC
  Filled 2017-07-22: qty 2

## 2017-07-22 MED ORDER — EPHEDRINE SULFATE 50 MG/ML IJ SOLN
INTRAMUSCULAR | Status: DC | PRN
  Administered 2017-07-22: 10 mg via INTRAVENOUS
  Administered 2017-07-22 (×2): 5 mg via INTRAVENOUS

## 2017-07-22 NOTE — Anesthesia Procedure Notes (Signed)
Performed by: Cook-Martin, Ison Wichmann Pre-anesthesia Checklist: Patient identified, Emergency Drugs available, Suction available, Patient being monitored and Timeout performed Patient Re-evaluated:Patient Re-evaluated prior to induction Oxygen Delivery Method: Nasal cannula Preoxygenation: Pre-oxygenation with 100% oxygen Induction Type: IV induction Ventilation: Oral airway inserted - appropriate to patient size Placement Confirmation: positive ETCO2 and CO2 detector       

## 2017-07-22 NOTE — Op Note (Signed)
[facility] Patient Name: Matthew Webster Procedure Date: 07/22/2017 12:42 PM MRN: [id number] Account #: [id number] Date of Birth: [birth year] Admit Type: Outpatient Age: 67 Room: [facility] ENDO ROOM 3 Gender: Male Note Status: Finalized Procedure:            Colonoscopy Indications:          Personal history of malignant neoplasm of the colon,                        Personal history of colonic polyps Providers:            Lollie Sails, MD Referring MD:         Youlanda Roys. Lovie Macadamia, MD (Referring MD) Medicines:            Monitored Anesthesia Care Complications:        No immediate complications. Procedure:            Pre-Anesthesia Assessment:                       - ASA Grade Assessment: III - A patient with severe                        systemic disease.                       After obtaining informed consent, the colonoscope was                        passed under direct vision. Throughout the procedure,                        the patient's blood pressure, pulse, and oxygen                        saturations were monitored continuously. The                        Colonoscope was introduced through the anus and                        advanced to the the cecum, identified by appendiceal                        orifice and ileocecal valve. The colonoscopy was                        performed without difficulty. The patient tolerated the                        procedure well. The quality of the bowel preparation                        was good. Findings:      A 2 mm polyp was found at 23 cm proximal to the anus. The polyp was       sessile. The polyp was removed with a cold biopsy forceps. Resection and       retrieval were complete.      There was evidence of a prior end-to-end colo-colonic anastomosis at 26       cm proximal to the anus. This was patent and  was characterized by       healthy appearing mucosa, possible old faded ink.      A 2 mm  polyp was found in the distal ascending colon. The polyp was       sessile. The polyp was removed with a cold biopsy forceps. Resection and       retrieval were complete.      Non-bleeding internal hemorrhoids were found during retroflexion. The       hemorrhoids were small. Impression:           - One 2 mm polyp at 23 cm proximal to the anus, removed                        with a cold biopsy forceps. Resected and retrieved.                       - Patent end-to-end colo-colonic anastomosis,                        characterized by healthy appearing mucosa.                       - One 2 mm polyp in the distal ascending colon, removed                        with a cold biopsy forceps. Resected and retrieved.                       - Non-bleeding internal hemorrhoids. Recommendation:       - Await pathology results.                       - Check CEA today. Procedure Code(s):    --- Professional ---                       [phone], Colonoscopy, flexible; with biopsy, single or                        multiple Diagnosis Code(s):    --- Professional ---                       D12.6, Benign neoplasm of colon, unspecified                       D12.2, Benign neoplasm of ascending colon                       Z98.0, Intestinal bypass and anastomosis status                       K64.8, Other hemorrhoids                       Z85.038, Personal history of other malignant neoplasm                        of large intestine                       Z86.010, Personal history of colonic polyps CPT copyright 2017 American Medical Association. All rights reserved. The codes documented in this report are preliminary  and upon coder review may  be revised to meet current compliance requirements. Lollie Sails, MD 07/22/2017 1:15:32 PM This report has been signed electronically. Number of Addenda: 0 Note Initiated On: 07/22/2017 12:42 PM Scope Withdrawal Time: 0 hours 12 minutes 2 seconds  Total Procedure Duration:  0 hours 20 minutes 58 seconds       [facility]

## 2017-07-22 NOTE — Anesthesia Post-op Follow-up Note (Signed)
Anesthesia QCDR form completed.        

## 2017-07-22 NOTE — H&P (Addendum)
Outpatient short stay form Pre-procedure 07/22/2017 12:36 PM Lollie Sails MD  Primary Physician: Juluis Pitch, MD  Reason for visit: Colonoscopy  History of present illness: Patient is a 67 year old male presenting today as above.  He has personal history of numerous colon polyps and colon cancer.  He had a sigmoid colectomy done in 2016.  His last colonoscopy was 116 2016.  He tolerated his prep well.  He takes no aspirin or blood thinning agent.    Current Facility-Administered Medications:  .  0.9 %  sodium chloride infusion, , Intravenous, Continuous, Lollie Sails, MD .  0.9 %  sodium chloride infusion, , Intravenous, Continuous, Lollie Sails, MD .  lidocaine (PF) (XYLOCAINE) 1 % injection 2 mL, 2 mL, Intradermal, Once, Lollie Sails, MD .  lidocaine (PF) (XYLOCAINE) 1 % injection, , , ,   Medications Prior to Admission  Medication Sig Dispense Refill Last Dose  . sildenafil (REVATIO) 20 MG tablet Take 20 mg by mouth 3 (three) times daily as needed.     . diazepam (VALIUM) 5 MG tablet take 1 tablet by mouth every 12 hours if needed for anxiety  0 Taking  . latanoprost (XALATAN) 0.005 % ophthalmic solution   0 Taking  . losartan-hydrochlorothiazide (HYZAAR) 50-12.5 MG tablet Take by mouth.   Taking  . tamsulosin (FLOMAX) 0.4 MG CAPS capsule take 1 capsule by mouth once daily TAKE 30 MINUTES AFTER SAME MEAL EACH DAY   Taking  . timolol (TIMOPTIC) 0.5 % ophthalmic solution instill 1 drop into left eye every morning  0 Taking  . venlafaxine XR (EFFEXOR-XR) 150 MG 24 hr capsule   0 Taking     Allergies  Allergen Reactions  . Codeine   . Shellfish Allergy Nausea Only    raw  . Penicillin V Potassium Rash     Past Medical History:  Diagnosis Date  . Benign non-nodular prostatic hyperplasia with lower urinary tract symptoms   . 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 Depression 11/24/2013   Overview:  Questionable bipolar  . Fatty liver    on CT   . Hypertension 11/24/2013    Review of systems:      Physical Exam    Heart and lungs: Without rub or gallop, lungs are bilaterally clear.    HEENT: Normocephalic atraumatic eyes are anicteric    Other:    Pertinant exam for procedure: Soft nontender nondistended bowel sounds positive normoactive    Planned proceedures: Colonoscopy and indicated procedures. I have discussed the risks benefits and complications of procedures to include not limited to bleeding, infection, perforation and the risk of sedation and the patient wishes to proceed.    Lollie Sails, MD Gastroenterology 07/22/2017  12:36 PM

## 2017-07-22 NOTE — Transfer of Care (Signed)
Immediate Anesthesia Transfer of Care Note  Patient: Matthew Webster  Procedure(s) Performed: COLONOSCOPY WITH PROPOFOL (N/A )  Patient Location: PACU  Anesthesia Type:General  Level of Consciousness: awake and sedated  Airway & Oxygen Therapy: Patient Spontanous Breathing and Patient connected to nasal cannula oxygen  Post-op Assessment: Report given to RN  Post vital signs: Reviewed and stable  Last Vitals:  Vitals Value Taken Time  BP    Temp    Pulse    Resp    SpO2      Last Pain:  Vitals:   07/22/17 1212  TempSrc: Tympanic  PainSc: 0-No pain         Complications: No apparent anesthesia complications

## 2017-07-22 NOTE — Anesthesia Preprocedure Evaluation (Signed)
Anesthesia Evaluation  Patient identified by MRN, date of birth, ID band Patient awake    Reviewed: Allergy & Precautions, H&P , NPO status , Patient's Chart, lab work & pertinent test results  Airway Mallampati: III  TM Distance: <3 FB Neck ROM: full    Dental  (+) Chipped, Poor Dentition   Pulmonary neg pulmonary ROS, neg shortness of breath,           Cardiovascular Exercise Tolerance: Good hypertension, (-) angina(-) Past MI and (-) DOE      Neuro/Psych PSYCHIATRIC DISORDERS negative neurological ROS     GI/Hepatic negative GI ROS, Neg liver ROS, neg GERD  ,  Endo/Other  negative endocrine ROS  Renal/GU negative Renal ROS  negative genitourinary   Musculoskeletal   Abdominal   Peds  Hematology negative hematology ROS (+)   Anesthesia Other Findings Past Medical History: No date: Benign non-nodular prostatic hyperplasia with lower urinary  tract symptoms 06/20/2014: Cancer of sigmoid colon ([place]) 11/24/2013: Depression     Comment:  Overview:  Questionable bipolar No date: Fatty liver     Comment:  on CT 11/24/2013: Hypertension  Past Surgical History: 2011: CATARACT EXTRACTION W/ INTRAOCULAR LENS IMPLANT; Left No date: LAPAROSCOPIC BOWEL RESECTION No date: RETINAL DETACHMENT SURGERY No date: TONSILLECTOMY  BMI    Body Mass Index:  28.29 kg/m      Reproductive/Obstetrics negative OB ROS                             Anesthesia Physical Anesthesia Plan  ASA: III  Anesthesia Plan: General   Post-op Pain Management:    Induction: Intravenous  PONV Risk Score and Plan: Propofol infusion and TIVA  Airway Management Planned: Natural Airway and Nasal Cannula  Additional Equipment:   Intra-op Plan:   Post-operative Plan:   Informed Consent: I have reviewed the patients History and Physical, chart, labs and discussed the procedure including the risks, benefits and  alternatives for the proposed anesthesia with the patient or authorized representative who has indicated his/her understanding and acceptance.   Dental Advisory Given  Plan Discussed with: Anesthesiologist, CRNA and Surgeon  Anesthesia Plan Comments: (Patient consented for risks of anesthesia including but not limited to:  - adverse reactions to medications - risk of intubation if required - damage to teeth, lips or other oral mucosa - sore throat or hoarseness - Damage to heart, brain, lungs or loss of life  Patient voiced understanding.)        Anesthesia Quick Evaluation

## 2017-07-23 LAB — SURGICAL PATHOLOGY

## 2017-07-23 LAB — CEA: CEA: 2.3 ng/mL (ref 0.0–4.7)

## 2017-07-23 NOTE — Anesthesia Postprocedure Evaluation (Signed)
Anesthesia Post Note  Patient: Matthew Webster  Procedure(s) Performed: COLONOSCOPY WITH PROPOFOL (N/A )  Patient location during evaluation: Endoscopy Anesthesia Type: General Level of consciousness: awake and alert Pain management: pain level controlled Vital Signs Assessment: post-procedure vital signs reviewed and stable Respiratory status: spontaneous breathing, nonlabored ventilation, respiratory function stable and patient connected to nasal cannula oxygen Cardiovascular status: blood pressure returned to baseline and stable Postop Assessment: no apparent nausea or vomiting Anesthetic complications: no     Last Vitals:  Vitals:   07/22/17 1400 07/22/17 1415  BP:  (!) 135/92  Pulse: (!) 59 (!) 56  Resp: (!) 21 18  Temp:    SpO2: 98% 98%    Last Pain:  Vitals:   07/22/17 1345  TempSrc:   PainSc: 0-No pain                 Precious Haws Lam Mccubbins

## 2017-07-25 ENCOUNTER — Encounter: Payer: Self-pay | Admitting: Gastroenterology

## 2017-08-04 ENCOUNTER — Emergency Department
Admission: EM | Admit: 2017-08-04 | Discharge: 2017-08-05 | Disposition: A | Discharge status: Other Acute Inpatient Hospital | Payer: Medicare Other | Attending: Emergency Medicine | Admitting: Emergency Medicine

## 2017-08-04 ENCOUNTER — Emergency Department: Payer: Medicare Other

## 2017-08-04 ENCOUNTER — Other Ambulatory Visit: Payer: Self-pay

## 2017-08-04 ENCOUNTER — Encounter: Payer: Self-pay | Admitting: *Deleted

## 2017-08-04 DIAGNOSIS — S52022B Displaced fracture of olecranon process without intraarticular extension of left ulna, initial encounter for open fracture type I or II: Secondary | ICD-10-CM

## 2017-08-04 DIAGNOSIS — S60222A Contusion of left hand, initial encounter: Secondary | ICD-10-CM | POA: Insufficient documentation

## 2017-08-04 DIAGNOSIS — Z23 Encounter for immunization: Secondary | ICD-10-CM | POA: Insufficient documentation

## 2017-08-04 DIAGNOSIS — Y92410 Unspecified street and highway as the place of occurrence of the external cause: Secondary | ICD-10-CM | POA: Diagnosis not present

## 2017-08-04 DIAGNOSIS — S52002B Unspecified fracture of upper end of left ulna, initial encounter for open fracture type I or II: Secondary | ICD-10-CM

## 2017-08-04 DIAGNOSIS — Y998 Other external cause status: Secondary | ICD-10-CM | POA: Diagnosis not present

## 2017-08-04 DIAGNOSIS — T07XXXA Unspecified multiple injuries, initial encounter: Secondary | ICD-10-CM | POA: Diagnosis not present

## 2017-08-04 DIAGNOSIS — Y9355 Activity, bike riding: Secondary | ICD-10-CM | POA: Insufficient documentation

## 2017-08-04 DIAGNOSIS — I10 Essential (primary) hypertension: Secondary | ICD-10-CM | POA: Diagnosis not present

## 2017-08-04 DIAGNOSIS — T148XXA Other injury of unspecified body region, initial encounter: Secondary | ICD-10-CM

## 2017-08-04 DIAGNOSIS — S59902A Unspecified injury of left elbow, initial encounter: Secondary | ICD-10-CM | POA: Diagnosis present

## 2017-08-04 DIAGNOSIS — Z79899 Other long term (current) drug therapy: Secondary | ICD-10-CM | POA: Insufficient documentation

## 2017-08-04 MED ORDER — CEFAZOLIN SODIUM-DEXTROSE 1-4 GM/50ML-% IV SOLN
1.0000 g | Freq: Once | INTRAVENOUS | Status: AC
  Administered 2017-08-04: 1 g via INTRAVENOUS
  Filled 2017-08-04: qty 50

## 2017-08-04 MED ORDER — BACITRACIN ZINC 500 UNIT/GM EX OINT
TOPICAL_OINTMENT | CUTANEOUS | Status: AC
  Administered 2017-08-04: 3
  Filled 2017-08-04: qty 2.7

## 2017-08-04 MED ORDER — TETANUS-DIPHTH-ACELL PERTUSSIS 5-2.5-18.5 LF-MCG/0.5 IM SUSP
0.5000 mL | Freq: Once | INTRAMUSCULAR | Status: AC
  Administered 2017-08-04: 0.5 mL via INTRAMUSCULAR
  Filled 2017-08-04: qty 0.5
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
## 2017-08-04 NOTE — ED Notes (Signed)
Xray at bedside with patient
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
## 2017-08-05 MED ORDER — ACETAMINOPHEN 325 MG PO TABS
650.00 | ORAL_TABLET | ORAL | Status: DC
Start: 2017-08-05 — End: 2017-08-05

## 2017-08-05 MED ORDER — KCL IN DEXTROSE-NACL 20-5-0.9 MEQ/L-%-% IV SOLN
100.00 | INTRAVENOUS | Status: DC
Start: ? — End: 2017-08-05

## 2017-08-05 MED ORDER — GENERIC EXTERNAL MEDICATION
Status: DC
Start: ? — End: 2017-08-05

## 2017-08-05 MED ORDER — VENLAFAXINE HCL ER 150 MG PO CP24
150.00 | ORAL_CAPSULE | ORAL | Status: DC
Start: 2017-08-06 — End: 2017-08-05

## 2017-08-05 MED ORDER — VENLAFAXINE HCL ER 150 MG PO CP24
150.0000 mg | ORAL_CAPSULE | Freq: Once | ORAL | Status: DC
  Filled 2017-08-05: qty 1

## 2017-08-05 MED ORDER — VENLAFAXINE HCL 37.5 MG PO TABS: 150.0000 mg | ORAL_TABLET | Freq: Once | ORAL | Status: DC

## 2017-08-05 MED ORDER — ONDANSETRON 4 MG PO TBDP
4.00 | ORAL_TABLET | ORAL | Status: DC
Start: ? — End: 2017-08-05

## 2017-08-05 NOTE — ED Notes (Signed)
Medical Necessity and EMTALA documentation reviewed at this time and found to be complete per policy. 

## 2017-08-05 NOTE — ED Notes (Signed)
Pt did not sign E-signature for transfer but gave verbal consent.

## 2017-10-02 ENCOUNTER — Other Ambulatory Visit: Payer: Self-pay

## 2017-10-02 ENCOUNTER — Ambulatory Visit: Payer: Medicare Other | Attending: Orthopedic Surgery | Admitting: Occupational Therapy

## 2017-10-02 ENCOUNTER — Encounter: Payer: Self-pay | Admitting: Occupational Therapy

## 2017-10-02 DIAGNOSIS — M6281 Muscle weakness (generalized): Secondary | ICD-10-CM | POA: Insufficient documentation

## 2017-10-02 DIAGNOSIS — M25622 Stiffness of left elbow, not elsewhere classified: Secondary | ICD-10-CM | POA: Insufficient documentation

## 2017-10-02 DIAGNOSIS — M25522 Pain in left elbow: Secondary | ICD-10-CM | POA: Diagnosis present

## 2017-10-02 NOTE — Patient Instructions (Signed)
Padding under elbow when resting on it  Elbow sleeve made  Use heating pad for elbow extention stretch  And then prolonged stretch by wife or light weight ( 2 lbs)  3 min - 5 min  massage over distal bicep Extention stretch PROM 30 sec  X 5 1 lbs for elbow extention punch, down to table elbow  Extend elbow to table ( palm up and thumb up)  10 reps  if no pain  Can do 2 sets in 3 -4 days
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
## 2017-10-10 ENCOUNTER — Ambulatory Visit: Payer: Medicare Other | Admitting: Occupational Therapy

## 2017-10-10 DIAGNOSIS — M6281 Muscle weakness (generalized): Secondary | ICD-10-CM

## 2017-10-10 DIAGNOSIS — M25522 Pain in left elbow: Secondary | ICD-10-CM

## 2017-10-10 DIAGNOSIS — M25622 Stiffness of left elbow, not elsewhere classified: Secondary | ICD-10-CM | POA: Diagnosis not present

## 2017-10-10 NOTE — Therapy (Signed)
[facility] [address], [place], [address] Phone: [phone]   Fax:  [phone]  Occupational Therapy Treatment  Patient Details  Name: Matthew Webster MRN: [id number] Date of Birth: April 29, 1950 Referring Provider: Tristan Schroeder   Encounter Date: 10/10/2017  OT End of Session - 10/10/17 1414    Visit Number  2    Number of Visits  12    Date for OT Re-Evaluation  11/13/17    OT Start Time  1145    OT Stop Time  1223    OT Time Calculation (min)  38 min    Activity Tolerance  Patient tolerated treatment well    Behavior During Therapy  [facility] for tasks assessed/performed       Past Medical History:  Diagnosis Date  . Benign non-nodular prostatic hyperplasia with lower urinary tract symptoms   . 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 Depression 11/24/2013   Overview:  Questionable bipolar  . Fatty liver    on CT  . Hypertension 11/24/2013    Past Surgical History:  Procedure Laterality Date  . CATARACT EXTRACTION W/ INTRAOCULAR LENS IMPLANT Left 2011  . COLONOSCOPY WITH PROPOFOL N/A 07/22/2017   Procedure: COLONOSCOPY WITH PROPOFOL;  Surgeon: Lollie Sails, MD;  Location: [facility] ENDOSCOPY;  Service: Endoscopy;  Laterality: N/A;  . LAPAROSCOPIC BOWEL RESECTION    . RETINAL DETACHMENT SURGERY    . TONSILLECTOMY      There were no vitals filed for this visit.  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  No/denies         [facility] OT Assessment - 10/10/17 0001      AROM   Left Elbow Extension  -18               OT Treatments/Exercises (OP) - 10/10/17 0001      LUE  Paraffin   Number Minutes Paraffin  10 Minutes    LUE Paraffin Location  --   elbow   Comments  heatingpad for extention stretch over elbow      Soft tissue mobs to distal bicep and tricept- graston tool nr 2 for sweeping- scar  Moves very well  No adhesion seen   In supine  stretches for wrist extention done by OT - 3 positions  Add and done  YTB for elbow extention in supine -  Mod v/c to aim to touch mat with wrist - 2 positions - 12 reps  And standing - shoulder extention but lock elbow to side 12 reps   and increase sets over week And then RTB for elbow flexion - 12 reps - 2 positions- thumb up and palm up Increase sets over week to 3 sets too  Horizontal abd for YTB - 12reps   Pt out of town next week     OT Education - 10/10/17 1413    Education Details  HEP upgrade and add YTB and RTB to HEP     Person(s) Educated  Patient  Methods  Explanation;Demonstration;Handout    Comprehension  Verbalized understanding;Returned demonstration       OT Short Term Goals - 10/02/17 1828      OT SHORT TERM GOAL #1   Title  Pt to be ind in HEP to increase L elbow extention by 10 degrees to reach over head     Baseline  -25 extention of elbow     Time  3    Period  Weeks    Status  New    Target Date  10/23/17        OT Long Term Goals - 10/02/17 1829      OT LONG TERM GOAL #1   Title  Strength in L elbow increase to 4+/5 to carry more than 5 lbs without symptoms , and 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    Baseline  5 lbs limit - doing 2 lbs - and  tender posterior elbow     Time  6    Period  Weeks    Status  New    Target Date  11/13/17      OT LONG TERM GOAL #2   Title  Function score on PREE improve with more than 10 points     Baseline  at eval function score on PREE 16/50     Time  6    Period  Weeks    Status  New    Target Date  11/13/17            Plan - 10/10/17 1414    Clinical Impression Statement  Pt present 9 wks s/p L olecranon fx - pt show increase  AROM and PROM in elbow extention with less pain ( -18 ext coming in and -8 in clinci) -and able to add to HEP this date YTB and RTB - pt to focus on end range and not only increase resistance     Occupational performance deficits (Please refer to evaluation for details):  ADL's;IADL's;Work;Play;Leisure    Rehab Potential  Good    Current Impairments/barriers affecting progress:  possibly not getting full extention back per surgeon    OT Frequency  --   1-2 x wk   OT Duration  6 weeks    OT Treatment/Interventions  Therapeutic exercise;Self-care/ADL training;Moist Heat;Paraffin;Patient/family education;Passive range of motion;Manual Therapy;Ultrasound    Plan  assess progress with HEP and upgrade as needed     Clinical Decision Making  Several treatment options, min-mod task modification necessary    OT Home Exercise Plan  see pt instruction    Consulted and Agree with Plan of Care  Patient       Patient will benefit from skilled therapeutic intervention in order to improve the following deficits and impairments:  Pain, Impaired flexibility, Decreased scar mobility, Decreased strength, Impaired UE functional use  Visit Diagnosis: Stiffness of left elbow, not elsewhere classified  Muscle weakness (generalized)  Pain in left elbow    Problem List Patient Active Problem List   Diagnosis Date Noted  . Enlarged prostate with urinary obstruction 06/20/2014  . Cancer of sigmoid colon ([place]) 06/20/2014  . Depression 11/24/2013  . Hypertension 11/24/2013    Rosalyn Gess OTR/L,CLT  10/10/2017, 2:18 PM  [facility] [address], [place], [address] Phone: [phone]   Fax:  [phone]  Name: Matthew Webster MRN: [id number] Date of Birth: 1950/06/29

## 2017-10-10 NOTE — Patient Instructions (Signed)
Pt to cont with same stretches for wrist extention  Add YTB for elbow extention in supine - 12 reps  And standing - shoulder extention but lock elbow to side 12 reps   and increase sets over week And then RTB for elbow flexion - 12 reps - 2 positions- thumb up and palm up Increase sets over week to 3 sets too  Horizontal abd for YTB - 12reps

## 2017-10-16 ENCOUNTER — Other Ambulatory Visit: Payer: Medicare Other

## 2017-10-16 ENCOUNTER — Other Ambulatory Visit: Payer: Self-pay

## 2017-10-16 DIAGNOSIS — R972 Elevated prostate specific antigen [PSA]: Secondary | ICD-10-CM

## 2017-10-20 ENCOUNTER — Ambulatory Visit: Payer: Medicare Other | Admitting: Occupational Therapy

## 2017-10-21 ENCOUNTER — Ambulatory Visit: Payer: Medicare Other | Admitting: Urology

## 2017-10-29 ENCOUNTER — Ambulatory Visit: Payer: Medicare Other | Admitting: Physical Therapy

## 2017-11-03 ENCOUNTER — Ambulatory Visit: Payer: Medicare Other | Attending: Orthopedic Surgery | Admitting: Occupational Therapy

## 2017-11-03 DIAGNOSIS — M25522 Pain in left elbow: Secondary | ICD-10-CM | POA: Diagnosis present

## 2017-11-03 DIAGNOSIS — M6281 Muscle weakness (generalized): Secondary | ICD-10-CM

## 2017-11-03 DIAGNOSIS — M25622 Stiffness of left elbow, not elsewhere classified: Secondary | ICD-10-CM | POA: Insufficient documentation
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
## 2017-11-03 NOTE — Therapy (Signed)
[facility] [address] [place], [address] Phone: [phone]   Fax:  [phone]  Occupational Therapy Treatment  Patient Details  Name: Matthew Webster MRN: [id number] Date of Birth: 10-15-1950 No data recorded  Encounter Date: 11/03/2017  OT End of Session - 11/03/17 1401    Visit Number  3    Number of Visits  3    Date for OT Re-Evaluation  11/03/17    OT Start Time  1301    OT Stop Time  1332    OT Time Calculation (min)  31 min    Activity Tolerance  Patient tolerated treatment well    Behavior During Therapy  [facility] for tasks assessed/performed       Past Medical History:  Diagnosis Date  . Benign non-nodular prostatic hyperplasia with lower urinary tract symptoms   . 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 Depression 11/24/2013   Overview:  Questionable bipolar  . Fatty liver    on CT  . Hypertension 11/24/2013    Past Surgical History:  Procedure Laterality Date  . CATARACT EXTRACTION W/ INTRAOCULAR LENS IMPLANT Left 2011  . COLONOSCOPY WITH PROPOFOL N/A 07/22/2017   Procedure: COLONOSCOPY WITH PROPOFOL;  Surgeon: Lollie Sails, MD;  Location: [facility] ENDOSCOPY;  Service: Endoscopy;  Laterality: N/A;  . LAPAROSCOPIC BOWEL RESECTION    . RETINAL DETACHMENT SURGERY    . TONSILLECTOMY      There were no vitals filed for this visit.  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  Yes    Pain Score  3     Pain  Location  Elbow    Pain Orientation  Left    Pain Descriptors / Indicators  Tender    Pain Type  Surgical pain    Pain Onset  More than a month ago    Pain Frequency  Occasional    Aggravating Factors   extention end range with resistance          [facility] OT Assessment - 11/03/17 0001      AROM   Left Elbow Extension  -12      Strength   Right Hand Grip (lbs)  82   82 extended arm    Left Hand Grip (lbs)  81   79 extended arm       assess pt L elbow extention and flexion   extention -12  Flexion WNL   but did show pocket of swelling over distal scar this date - more prominent when with flexion of elbow   pt to show and ask pt   cont to report most tenderness and pain with pressure and elbow extention against resistance at posterior elbow - distal triceps But not more than 3/10   Pt was able  to carry and lift 5 lbs with good form - no pain  Pt 3 months this date - had pt hold , lift to side and over head - gradually 6 -10 lbs   with poor form to do over head  And pain over posterior elbow 3/10    pt to follow up with surgeon - pt is about 3 months now - and ask him about pocket of swelling distal scar ,hard ware that bothers him , and then increase weight 1-2 lbs for strength training a week- check with surgeon - but this OT would recommend that  Also surgeon needs to clear when to go back to biking No boxing with bag     Pt to cont working on end range of motion for extention - did progress the last 3 wks on his own  Increase his weight in gym with workout - about 1-2 lbs per week -and pain should be less than 2-3/10 pain    Pt to ask about pain in posterior elbow, hard ware bothering him and pocket of swelling at distal scar  And returning to biking               OT Education - 11/03/17 1401    Education Details  homeprogram to cont with     Person(s) Educated  Patient    Methods  Explanation;Demonstration    Comprehension  Verbalized  understanding;Returned demonstration       OT Short Term Goals - 11/03/17 1408      OT SHORT TERM GOAL #1   Title  Pt to be ind in HEP to increase L elbow extention by 10 degrees to reach over head     Baseline  -25 extention of elbow at eval and now -12     Status  Achieved        OT Long Term Goals - 11/03/17 1408      OT LONG TERM GOAL #1   Title  Strength in L elbow increase to 4+/5 to carry more than 5 lbs without symptoms , and 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    Baseline  5 lbs limit - can carry without issues- and 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     Status  Achieved      OT [place] #2   Title  Function score on PREE improve with more than 10 points     Baseline  at eval function score on PREE 16/50 - NT     Status  Deferred            Plan - 11/03/17 1402    Clinical Impression Statement  Pt return this date after being at the beach - seen 3 wks ago -pt about 3 months out from S/p  pt report doing about 5 lbs weight -and did extention stretch - and doing some exercises at gym - reviewed with pt HEP for discharge to work on extention - did improve to -12 for extention - still have some pain over posterior elbow with resistance and end range - pt discharege with HEP to advance progress     Occupational performance deficits (Please refer to evaluation for details):  ADL's;IADL's;Work;Play;Leisure    Current Impairments/barriers affecting progress:  possibly not getting full extention back per surgeon    Plan  dischare instruction     Clinical Decision Making  Limited treatment options, no task modification necessary    [place]  see pt instruction    Consulted and Agree with Plan of Care  Patient       Patient will benefit from skilled therapeutic intervention in order to improve the following deficits and impairments:     Visit Diagnosis: Muscle weakness (generalized)  Pain in left elbow  Stiffness of left elbow, not elsewhere  classified    Problem List Patient Active Problem List   Diagnosis Date Noted  . Enlarged prostate with urinary obstruction 06/20/2014  . 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 Depression 11/24/2013  . Hypertension 11/24/2013    Rosalyn Gess OTR/L,CLT 11/03/2017, 5:56 PM  [facility] [address] [place], [address] Phone: [phone]   Fax:  [phone]  Name: Matthew Webster MRN: [id number] Date of Birth: 1950/07/27

## 2017-12-18 ENCOUNTER — Other Ambulatory Visit: Payer: Medicare Other

## 2017-12-18 DIAGNOSIS — R972 Elevated prostate specific antigen [PSA]: Secondary | ICD-10-CM

## 2017-12-19 LAB — PSA: PROSTATE SPECIFIC AG, SERUM: 6.7 ng/mL — AB (ref 0.0–4.0)

## 2017-12-23 ENCOUNTER — Ambulatory Visit (INDEPENDENT_AMBULATORY_CARE_PROVIDER_SITE_OTHER): Payer: Medicare Other | Admitting: Urology

## 2017-12-23 ENCOUNTER — Encounter: Payer: Self-pay | Admitting: Urology

## 2017-12-23 VITALS — BP 146/92 | HR 65 | Ht 65.0 in | Wt 185.2 lb

## 2017-12-23 DIAGNOSIS — N138 Other obstructive and reflux uropathy: Secondary | ICD-10-CM

## 2017-12-23 DIAGNOSIS — R972 Elevated prostate specific antigen [PSA]: Secondary | ICD-10-CM | POA: Diagnosis not present

## 2017-12-23 DIAGNOSIS — N401 Enlarged prostate with lower urinary tract symptoms: Secondary | ICD-10-CM

## 2017-12-23 MED ORDER — TAMSULOSIN HCL 0.4 MG PO CAPS: 0.4000 mg | ORAL_CAPSULE | Freq: Every day | ORAL | 4 refills | Status: DC
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
## 2018-12-21 ENCOUNTER — Other Ambulatory Visit: Payer: Self-pay

## 2018-12-21 ENCOUNTER — Other Ambulatory Visit: Payer: Medicare Other

## 2018-12-21 DIAGNOSIS — R972 Elevated prostate specific antigen [PSA]: Secondary | ICD-10-CM

## 2018-12-22 LAB — PSA: Prostate Specific Ag, Serum: 5.8 ng/mL — ABNORMAL HIGH (ref 0.0–4.0)

## 2018-12-29 ENCOUNTER — Encounter: Payer: Self-pay | Admitting: Urology

## 2018-12-29 ENCOUNTER — Ambulatory Visit: Payer: Medicare Other | Admitting: Urology

## 2018-12-29 ENCOUNTER — Other Ambulatory Visit: Payer: Self-pay

## 2018-12-29 VITALS — BP 144/90 | HR 77 | Ht 65.0 in | Wt 185.0 lb

## 2018-12-29 DIAGNOSIS — N5203 Combined arterial insufficiency and corporo-venous occlusive erectile dysfunction: Secondary | ICD-10-CM | POA: Diagnosis not present

## 2018-12-29 DIAGNOSIS — N401 Enlarged prostate with lower urinary tract symptoms: Secondary | ICD-10-CM | POA: Diagnosis not present

## 2018-12-29 DIAGNOSIS — R972 Elevated prostate specific antigen [PSA]: Secondary | ICD-10-CM | POA: Diagnosis not present

## 2018-12-29 DIAGNOSIS — N138 Other obstructive and reflux uropathy: Secondary | ICD-10-CM | POA: Diagnosis not present

## 2018-12-29 NOTE — Progress Notes (Signed)
12/29/2018 5:25 PM   Matthew Webster 1950/06/22 [id number]  Referring provider: Juluis Pitch, MD [place] [place],  [place] [address]  Chief Complaint  Patient presents with  . Elevated PSA    HPI: 68 year old male with a history of elevated/fluctuating PSA who returns today for routine annual follow-up.  His last seen 11/2017.  In the interim, his PSA has trended back down most recently to 5.8 this year.  PSA trend as below.  He is s/p negative prostate biopsy in 04/2013 at which time his PSA was 5.0 ng/DL. TRUS volume 19 gm.  MRI of his prostate in 2017 showed a PI-RADS 2 lesion, otherwise unremarkable.  He also has a longstanding history of obstructive urinary symptoms.  He is well controlled on Flomax only.  He reports that on one occasion, 8 he stopped this medication for short period and immediately noticed a decreased urinary stream and difficulty emptying his bladder.  This went away after resuming this medication.  He has no side effects from this medication.  In addition to the above, he mentions today that he occasionally takes Viagra for erectile dysfunction.  He wonders today if there is anything new that can help enhance his orgasms.  He does have some retrograde ejaculation.  PSA trend: 5.4 11/16 6.4 6/17 7.9 9/17 7.7 3/18 5.8  9/18 6.7  11/19 5.8  11/20  PMH: Past Medical History:  Diagnosis Date  . Benign non-nodular prostatic hyperplasia with lower urinary tract symptoms   . 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 Depression 11/24/2013   Overview:  Questionable bipolar  . Fatty liver    on CT  . Hypertension 11/24/2013    Surgical History: Past Surgical History:  Procedure Laterality Date  . CATARACT EXTRACTION W/ INTRAOCULAR LENS IMPLANT Left 2011  . COLONOSCOPY WITH PROPOFOL N/A 07/22/2017   Procedure: COLONOSCOPY WITH PROPOFOL;  Surgeon: Lollie Sails, MD;  Location: [facility] ENDOSCOPY;  Service: Endoscopy;  Laterality: N/A;  .  LAPAROSCOPIC BOWEL RESECTION    . RETINAL DETACHMENT SURGERY    . TONSILLECTOMY      Home Medications:  Allergies as of 12/29/2018      Reactions   Codeine    Other Other (See Comments)   ? Eye drops   Shellfish Allergy Nausea Only   raw raw   Penicillin V Potassium Rash      Medication List       Accurate as of December 29, 2018  5:25 PM. If you have any questions, ask your nurse or doctor.        diazepam 5 MG tablet Commonly known as: VALIUM take 1 tablet by mouth every 12 hours if needed for anxiety   latanoprost 0.005 % ophthalmic solution Commonly known as: XALATAN   losartan-hydrochlorothiazide 50-12.5 MG tablet Commonly known as: HYZAAR Take by mouth.   sildenafil 20 MG tablet Commonly known as: REVATIO Take 20 mg by mouth 3 (three) times daily as needed.   tamsulosin 0.4 MG Caps capsule Commonly known as: FLOMAX Take 1 capsule (0.4 mg total) by mouth daily.   timolol 0.5 % ophthalmic solution Commonly known as: TIMOPTIC instill 1 drop into left eye every morning   venlafaxine XR 150 MG 24 hr capsule Commonly known as: EFFEXOR-XR       Allergies:  Allergies  Allergen Reactions  . Codeine   . Other Other (See Comments)    ? Eye drops  . Shellfish Allergy Nausea Only  raw raw  . Penicillin V Potassium Rash    Family History: Family History  Problem Relation Age of Onset  . CVA Mother   . Diabetes Father   . Prostate cancer Neg Hx   . Bladder Cancer Neg Hx   . Kidney cancer Neg Hx     Social History:  reports that he has never smoked. He has never used smokeless tobacco. He reports current alcohol use of about 14.0 standard drinks of alcohol per week. He reports that he does not use drugs.  ROS: UROLOGY Frequent Urination?: No Hard to postpone urination?: No Burning/pain with urination?: No Get up at night to urinate?: Yes Leakage of urine?: No Urine stream starts and stops?: No Trouble starting stream?: No Do you have to  strain to urinate?: No Blood in urine?: No Urinary tract infection?: No Sexually transmitted disease?: No Injury to kidneys or bladder?: No Painful intercourse?: No Weak stream?: No Erection problems?: No Penile pain?: No  Gastrointestinal Nausea?: No Vomiting?: No Indigestion/heartburn?: No Diarrhea?: No Constipation?: No  Constitutional Fever: No Night sweats?: No Weight loss?: No Fatigue?: No  Skin Skin rash/lesions?: No Itching?: No  Eyes Blurred vision?: No Double vision?: No  Ears/Nose/Throat Sore throat?: No Sinus problems?: Yes  Hematologic/Lymphatic Swollen glands?: No Easy bruising?: No  Cardiovascular Leg swelling?: No Chest pain?: No  Respiratory Cough?: No Shortness of breath?: No  Endocrine Excessive thirst?: No  Musculoskeletal Back pain?: No Joint pain?: No  Neurological Headaches?: No Dizziness?: No  Psychologic Depression?: Yes Anxiety?: No  Physical Exam: BP (!) 144/90   Pulse 77   Ht 5\' 5"  (1.651 m)   Wt 185 lb (83.9 kg)   BMI 30.79 kg/m   Constitutional:  Alert and oriented, No acute distress. HEENT: [place] AT, moist mucus membranes.  Trachea midline, no masses. Cardiovascular: No clubbing, cyanosis, or edema. Respiratory: Normal respiratory effort, no increased work of breathing. Rectal: Normal sphincter tone.  30 cc prostate, nontender, no nodules. Skin: No rashes, bruises or suspicious lesions. Neurologic: Grossly intact, no focal deficits, moving all 4 extremities. Psychiatric: Normal mood and affect.  Laboratory Data: Lab Results  Component Value Date   PLT 241 04/20/2014    Lab Results  Component Value Date   CREATININE 1.10 11/09/2015   PSA trend as above  Assessment & Plan:    1. Elevated PSA Personal history of fluctuating/elevated PSA  Status post negative prostate biopsy in the past as well as fairly unremarkable prostate MRI  PSA remains within his normal range thus we will continue to follow  annually in our office, he is agreeable this plan  2. Enlarged prostate with urinary obstruction Symptoms well controlled on Flomax  We do lengthy discussion today about alternatives to chronic pharmacotherapy and possible benefits of this.  Given his relatively small gland size, he would likely be a good candidate for UroLift versus standard TURP.  He might be interested in UroLift procedure.  I gave him information today we discussed this at length.  He will let us know if he would like to consider any of this in the future.  He is particular just in your left given its minimal sexual side effects.  3. Combined arterial insufficiency and corporo-venous occlusive erectile dysfunction Has used Viagra in the past without difficulty, continue this as needed  No new medications at this point time to "enhance" sexual experience   Return in about 1 year (around 12/29/2019) for IPSS/ PVR, PSA/ DRE.  Hollice Espy, MD  [facility] [address] [place], [place] [address] [phone]  I spent 25 min with this patient of which greater than 50% was spent in counseling and coordination of care with the patient.

## 2019-02-18 ENCOUNTER — Other Ambulatory Visit: Payer: Self-pay | Admitting: Gastroenterology

## 2019-02-18 DIAGNOSIS — R748 Abnormal levels of other serum enzymes: Secondary | ICD-10-CM

## 2019-02-18 DIAGNOSIS — F1020 Alcohol dependence, uncomplicated: Secondary | ICD-10-CM

## 2019-02-19 ENCOUNTER — Other Ambulatory Visit: Payer: Self-pay | Admitting: Urology

## 2019-02-24 ENCOUNTER — Other Ambulatory Visit: Payer: Self-pay

## 2019-02-24 ENCOUNTER — Ambulatory Visit
Admission: RE | Admit: 2019-02-24 | Discharge: 2019-02-24 | Disposition: A | Discharge status: Home / Self Care | Payer: Medicare Other | Source: Ambulatory Visit | Attending: Gastroenterology | Admitting: Gastroenterology

## 2019-02-24 DIAGNOSIS — F1020 Alcohol dependence, uncomplicated: Secondary | ICD-10-CM | POA: Diagnosis present

## 2019-02-24 DIAGNOSIS — R748 Abnormal levels of other serum enzymes: Secondary | ICD-10-CM | POA: Diagnosis present

## 2019-05-18 NOTE — Anesthesia Preprocedure Evaluation (Addendum)
Anesthesia Evaluation  Patient identified by MRN, date of birth, ID band Patient awake    Reviewed: Allergy & Precautions, NPO status , Patient's Chart, lab work & pertinent test results, reviewed documented beta blocker date and time   History of Anesthesia Complications Negative for: history of anesthetic complications  Airway Mallampati: II  TM Distance: >3 FB Neck ROM: Full    Dental   Pulmonary    breath sounds clear to auscultation       Cardiovascular hypertension, (-) angina(-) DOE  Rhythm:Regular Rate:Normal     Neuro/Psych PSYCHIATRIC DISORDERS Anxiety Depression    GI/Hepatic neg GERD  , Fatty liver   Endo/Other    Renal/GU      Musculoskeletal   Abdominal   Peds  Hematology   Anesthesia Other Findings Colon cancer   Reproductive/Obstetrics                            Anesthesia Physical Anesthesia Plan  ASA: II  Anesthesia Plan: MAC   Post-op Pain Management:    Induction: Intravenous  PONV Risk Score and Plan: 1 and TIVA, Midazolam and Treatment may vary due to age or medical condition  Airway Management Planned: Nasal Cannula  Additional Equipment:   Intra-op Plan:   Post-operative Plan:   Informed Consent: I have reviewed the patients History and Physical, chart, labs and discussed the procedure including the risks, benefits and alternatives for the proposed anesthesia with the patient or authorized representative who has indicated his/her understanding and acceptance.       Plan Discussed with: CRNA and Anesthesiologist  Anesthesia Plan Comments:         Anesthesia Quick Evaluation

## 2019-05-19 ENCOUNTER — Other Ambulatory Visit: Payer: Self-pay

## 2019-05-19 ENCOUNTER — Encounter: Payer: Self-pay | Admitting: Ophthalmology

## 2019-05-24 ENCOUNTER — Other Ambulatory Visit: Payer: Self-pay

## 2019-05-24 ENCOUNTER — Other Ambulatory Visit
Admission: RE | Admit: 2019-05-24 | Discharge: 2019-05-24 | Disposition: A | Discharge status: Home / Self Care | Payer: Medicare Other | Source: Ambulatory Visit | Attending: Ophthalmology | Admitting: Ophthalmology

## 2019-05-24 DIAGNOSIS — Z01812 Encounter for preprocedural laboratory examination: Secondary | ICD-10-CM | POA: Insufficient documentation

## 2019-05-24 DIAGNOSIS — Z20822 Contact with and (suspected) exposure to covid-19: Secondary | ICD-10-CM | POA: Insufficient documentation

## 2019-05-24 NOTE — Discharge Instructions (Signed)

## 2019-05-25 LAB — SARS CORONAVIRUS 2 (TAT 6-24 HRS): SARS Coronavirus 2: NEGATIVE

## 2019-05-26 ENCOUNTER — Ambulatory Visit: Payer: Medicare Other | Admitting: Anesthesiology

## 2019-05-26 ENCOUNTER — Ambulatory Visit
Admission: RE | Admit: 2019-05-26 | Discharge: 2019-05-26 | Disposition: A | Discharge status: Home / Self Care | Payer: Medicare Other | Attending: Ophthalmology | Admitting: Ophthalmology

## 2019-05-26 ENCOUNTER — Other Ambulatory Visit: Payer: Self-pay

## 2019-05-26 ENCOUNTER — Encounter
Admission: RE | Disposition: A | Discharge status: Home / Self Care | Payer: Self-pay | Source: Home / Self Care | Attending: Ophthalmology

## 2019-05-26 ENCOUNTER — Encounter: Payer: Self-pay | Admitting: Ophthalmology

## 2019-05-26 DIAGNOSIS — I10 Essential (primary) hypertension: Secondary | ICD-10-CM | POA: Insufficient documentation

## 2019-05-26 DIAGNOSIS — Z880 Allergy status to penicillin: Secondary | ICD-10-CM | POA: Insufficient documentation

## 2019-05-26 DIAGNOSIS — Z79899 Other long term (current) drug therapy: Secondary | ICD-10-CM | POA: Diagnosis not present

## 2019-05-26 DIAGNOSIS — Z888 Allergy status to other drugs, medicaments and biological substances status: Secondary | ICD-10-CM | POA: Insufficient documentation

## 2019-05-26 DIAGNOSIS — N400 Enlarged prostate without lower urinary tract symptoms: Secondary | ICD-10-CM | POA: Diagnosis not present

## 2019-05-26 DIAGNOSIS — Z85038 Personal history of other malignant neoplasm of large intestine: Secondary | ICD-10-CM | POA: Diagnosis not present

## 2019-05-26 DIAGNOSIS — H2511 Age-related nuclear cataract, right eye: Secondary | ICD-10-CM | POA: Insufficient documentation

## 2019-05-26 DIAGNOSIS — Z885 Allergy status to narcotic agent status: Secondary | ICD-10-CM | POA: Insufficient documentation

## 2019-05-26 DIAGNOSIS — F329 Major depressive disorder, single episode, unspecified: Secondary | ICD-10-CM | POA: Diagnosis not present

## 2019-05-26 HISTORY — PX: CATARACT EXTRACTION W/PHACO: SHX586

## 2019-05-26 SURGERY — PHACOEMULSIFICATION, CATARACT, WITH IOL INSERTION
Anesthesia: Monitor Anesthesia Care | Site: Eye | Laterality: Right

## 2019-05-26 MED ORDER — MOXIFLOXACIN HCL 0.5 % OP SOLN
1.0000 [drp] | OPHTHALMIC | Status: DC | PRN
  Administered 2019-05-26 (×3): 1 [drp] via OPHTHALMIC

## 2019-05-26 MED ORDER — LIDOCAINE HCL (PF) 2 % IJ SOLN
INTRAOCULAR | Status: DC | PRN
  Administered 2019-05-26: 1 mL

## 2019-05-26 MED ORDER — EPINEPHRINE PF 1 MG/ML IJ SOLN
INTRAOCULAR | Status: DC | PRN
  Administered 2019-05-26: 80 mL via OPHTHALMIC

## 2019-05-26 MED ORDER — FENTANYL CITRATE (PF) 100 MCG/2ML IJ SOLN
INTRAMUSCULAR | Status: DC | PRN
  Administered 2019-05-26 (×2): 50 ug via INTRAVENOUS

## 2019-05-26 MED ORDER — NA HYALUR & NA CHOND-NA HYALUR 0.4-0.35 ML IO KIT
PACK | INTRAOCULAR | Status: DC | PRN
  Administered 2019-05-26: 1 mL via INTRAOCULAR

## 2019-05-26 MED ORDER — MOXIFLOXACIN HCL 0.5 % OP SOLN
OPHTHALMIC | Status: DC | PRN
  Administered 2019-05-26: 0.2 mL via OPHTHALMIC

## 2019-05-26 MED ORDER — ARMC OPHTHALMIC DILATING DROPS
1.0000 "application " | OPHTHALMIC | Status: DC | PRN
  Administered 2019-05-26 (×2): 1 via OPHTHALMIC

## 2019-05-26 MED ORDER — TETRACAINE HCL 0.5 % OP SOLN
1.0000 [drp] | OPHTHALMIC | Status: DC | PRN
  Administered 2019-05-26 (×3): 1 [drp] via OPHTHALMIC

## 2019-05-26 MED ORDER — MIDAZOLAM HCL 2 MG/2ML IJ SOLN
INTRAMUSCULAR | Status: DC | PRN
  Administered 2019-05-26: 2 mg via INTRAVENOUS

## 2019-05-26 MED ORDER — LACTATED RINGERS IV SOLN: 100.0000 mL/h | INTRAVENOUS | Status: DC

## 2019-05-26 MED ORDER — ONDANSETRON HCL 4 MG/2ML IJ SOLN: 4.0000 mg | Freq: Once | INTRAMUSCULAR | Status: DC | PRN

## 2019-05-26 MED ORDER — ACETAMINOPHEN 10 MG/ML IV SOLN: 1000.0000 mg | Freq: Once | INTRAVENOUS | Status: DC | PRN

## 2019-05-26 SURGICAL SUPPLY — 23 items
CANNULA ANT/CHMB 27G (MISCELLANEOUS) ×1 IMPLANT
CANNULA ANT/CHMB 27GA (MISCELLANEOUS) ×2 IMPLANT
GLOVE SURG LX 7.5 STRW (GLOVE) ×1
GLOVE SURG LX STRL 7.5 STRW (GLOVE) ×1 IMPLANT
GLOVE SURG TRIUMPH 8.0 PF LTX (GLOVE) ×2 IMPLANT
GOWN STRL REUS W/ TWL LRG LVL3 (GOWN DISPOSABLE) ×2 IMPLANT
GOWN STRL REUS W/TWL LRG LVL3 (GOWN DISPOSABLE) ×2
LENS IOL ACRSF IQ ULTRA 12.5 (Intraocular Lens) IMPLANT
LENS IOL ACRYSOF IQ 12.5 (Intraocular Lens) ×2 IMPLANT
MARKER SKIN DUAL TIP RULER LAB (MISCELLANEOUS) ×2 IMPLANT
NDL CAPSULORHEX 25GA (NEEDLE) ×1 IMPLANT
NDL FILTER BLUNT 18X1 1/2 (NEEDLE) ×2 IMPLANT
NEEDLE CAPSULORHEX 25GA (NEEDLE) ×2 IMPLANT
NEEDLE FILTER BLUNT 18X 1/2SAF (NEEDLE) ×2
NEEDLE FILTER BLUNT 18X1 1/2 (NEEDLE) ×2 IMPLANT
PACK CATARACT BRASINGTON (MISCELLANEOUS) ×2 IMPLANT
PACK EYE AFTER SURG (MISCELLANEOUS) ×2 IMPLANT
PACK OPTHALMIC (MISCELLANEOUS) ×2 IMPLANT
SOLUTION OPHTHALMIC SALT (MISCELLANEOUS) ×2 IMPLANT
SYR 3ML LL SCALE MARK (SYRINGE) ×4 IMPLANT
SYR TB 1ML LUER SLIP (SYRINGE) ×2 IMPLANT
WATER STERILE IRR 250ML POUR (IV SOLUTION) ×2 IMPLANT
WIPE NON LINTING 3.25X3.25 (MISCELLANEOUS) ×2 IMPLANT

## 2019-05-26 NOTE — H&P (Signed)

## 2019-05-26 NOTE — Transfer of Care (Signed)
Immediate Anesthesia Transfer of Care Note  Patient: Matthew Webster  Procedure(s) Performed: CATARACT EXTRACTION PHACO AND INTRAOCULAR LENS PLACEMENT (IOC) RIGHT 10.64  01:10.2  15.1% (Right Eye)  Patient Location: PACU  Anesthesia Type: MAC  Level of Consciousness: awake, alert  and patient cooperative  Airway and Oxygen Therapy: Patient Spontanous Breathing and Patient connected to supplemental oxygen  Post-op Assessment: Post-op Vital signs reviewed, Patient's Cardiovascular Status Stable, Respiratory Function Stable, Patent Airway and No signs of Nausea or vomiting  Post-op Vital Signs: Reviewed and stable  Complications: No apparent anesthesia complications

## 2019-05-26 NOTE — Anesthesia Procedure Notes (Signed)
Procedure Name: MAC Performed by: Bailea Beed M, CRNA Pre-anesthesia Checklist: Timeout performed, Patient being monitored, Suction available, Emergency Drugs available and Patient identified Patient Re-evaluated:Patient Re-evaluated prior to induction Oxygen Delivery Method: Nasal cannula       

## 2019-05-26 NOTE — Anesthesia Postprocedure Evaluation (Signed)
Anesthesia Post Note  Patient: Matthew Webster  Procedure(s) Performed: CATARACT EXTRACTION PHACO AND INTRAOCULAR LENS PLACEMENT (IOC) RIGHT 10.64  01:10.2  15.1% (Right Eye)     Patient location during evaluation: PACU Anesthesia Type: MAC Level of consciousness: awake and alert Pain management: pain level controlled Vital Signs Assessment: post-procedure vital signs reviewed and stable Respiratory status: spontaneous breathing, nonlabored ventilation, respiratory function stable and patient connected to nasal cannula oxygen Cardiovascular status: stable and blood pressure returned to baseline Postop Assessment: no apparent nausea or vomiting Anesthetic complications: no    Adalea Handler A  Clearence Vitug

## 2019-05-26 NOTE — OR Nursing (Signed)
Timolol Maleate 0.5% 1 drop administered in place of Combigan due to Alphagan P allergy.

## 2019-05-26 NOTE — Op Note (Signed)
LOCATION:  [place]   PREOPERATIVE DIAGNOSIS:    Nuclear sclerotic cataract right eye. H25.11   POSTOPERATIVE DIAGNOSIS:  Nuclear sclerotic cataract right eye.     PROCEDURE:  Phacoemusification with posterior chamber intraocular lens placement of the right eye   ULTRASOUND TIME: Procedure(s): CATARACT EXTRACTION PHACO AND INTRAOCULAR LENS PLACEMENT (IOC) RIGHT 10.64  01:10.2  15.1% (Right)  LENS:   Implant Name Type Inv. Item Serial No. Manufacturer Lot No. LRB No. Used Action  LENS IOL ACRYSOF IQ 12.5 - [id number] Intraocular Lens LENS IOL ACRYSOF IQ 12.5 [id number] ALCON  Right 1 Implanted         SURGEON:  Wyonia Hough, MD   ANESTHESIA:  Topical with tetracaine drops and 2% Xylocaine jelly, augmented with 1% preservative-free intracameral lidocaine.    COMPLICATIONS:  None.   DESCRIPTION OF PROCEDURE:  The patient was identified in the holding room and transported to the operating room and placed in the supine position under the operating microscope.  The right eye was identified as the operative eye and it was prepped and draped in the usual sterile ophthalmic fashion.   A 1 millimeter clear-corneal paracentesis was made at the 12:00 position.  0.5 ml of preservative-free 1% lidocaine was injected into the anterior chamber. The anterior chamber was filled with Viscoat viscoelastic.  A 2.4 millimeter keratome was used to make a near-clear corneal incision at the 9:00 position.  A curvilinear capsulorrhexis was made with a cystotome and capsulorrhexis forceps.  Balanced salt solution was used to hydrodissect and hydrodelineate the nucleus.   Phacoemulsification was then used in stop and chop fashion to remove the lens nucleus and epinucleus.  The remaining cortex was then removed using the irrigation and aspiration handpiece. Provisc was then placed into the capsular bag to distend it for lens placement.  A lens was then injected into the capsular bag.  The  remaining viscoelastic was aspirated.   Wounds were hydrated with balanced salt solution.  The anterior chamber was inflated to a physiologic pressure with balanced salt solution.  No wound leaks were noted. Vigamox 0.2 ml of a 1mg  per ml solution was injected into the anterior chamber for a dose of 0.2 mg of intracameral antibiotic at the completion of the case.   Timolol drops were applied to the eye.  The patient was taken to the recovery room in stable condition without complications of anesthesia or surgery.   Deejay Koppelman 05/26/2019, 8:33 AM

## 2019-05-27 ENCOUNTER — Encounter: Payer: Self-pay | Admitting: *Deleted

## 2019-07-06 ENCOUNTER — Ambulatory Visit: Payer: Medicare Other | Admitting: Dermatology

## 2019-07-06 ENCOUNTER — Other Ambulatory Visit: Payer: Self-pay

## 2019-07-06 DIAGNOSIS — L304 Erythema intertrigo: Secondary | ICD-10-CM

## 2019-07-06 DIAGNOSIS — L570 Actinic keratosis: Secondary | ICD-10-CM

## 2019-07-06 DIAGNOSIS — D485 Neoplasm of uncertain behavior of skin: Secondary | ICD-10-CM

## 2019-07-06 DIAGNOSIS — L821 Other seborrheic keratosis: Secondary | ICD-10-CM | POA: Diagnosis not present

## 2019-07-06 DIAGNOSIS — L820 Inflamed seborrheic keratosis: Secondary | ICD-10-CM | POA: Diagnosis not present

## 2019-07-06 DIAGNOSIS — Z85828 Personal history of other malignant neoplasm of skin: Secondary | ICD-10-CM | POA: Diagnosis not present

## 2019-07-06 DIAGNOSIS — L578 Other skin changes due to chronic exposure to nonionizing radiation: Secondary | ICD-10-CM

## 2019-07-06 MED ORDER — KETOCONAZOLE 2 % EX CREA: 1.0000 "application " | TOPICAL_CREAM | Freq: Two times a day (BID) | CUTANEOUS | 2 refills | Status: AC

## 2019-07-06 MED ORDER — CALCIPOTRIENE 0.005 % EX CREA: TOPICAL_CREAM | Freq: Two times a day (BID) | CUTANEOUS | 0 refills | Status: DC

## 2019-07-06 MED ORDER — FLUOROURACIL 5 % EX CREA: TOPICAL_CREAM | Freq: Two times a day (BID) | CUTANEOUS | 0 refills | Status: DC

## 2019-07-06 NOTE — Progress Notes (Signed)
Follow-Up Visit   Subjective  JAKOB KIMBERLIN is a 69 y.o. male who presents for the following: Follow-up.  Patient here today for 6 month AK follow up. He does have a history of BCC. Patient complains today of a rash under arms and inside of upper thighs. Rash started about 1 month ago and heat seems to have brought it on. Patient does not recall ever having this rash before. He also has a scaly growth on his back that his wife is concerned that it has changed.  He also has growths under the arms that are irritated- one recently turned black and painful and he snipped it off this morning.  The following portions of the chart were reviewed this encounter and updated as appropriate:      Review of Systems:  No other skin or systemic complaints except as noted in HPI or Assessment and Plan.  Objective  Well appearing patient in no apparent distress; mood and affect are within normal limits.  A focused examination was performed including legs, trunk, arms, face. Relevant physical exam findings are noted in the Assessment and Plan.  Objective  Scalp: Erythematous thin papules/macules with gritty scale.   Objective  Right and Left Medial Thigh, axilla: Erythema of axilla and medial thighs  Objective  Right Forehead, left antihelix: Well healed scar with no evidence of recurrence.   Objective  Right Shoulder - Posterior x 1, R mid back x 1 (2): Erythematous keratotic or waxy stuck-on papule or plaque.  Small waxy papules, some pedunculated of the axilla  Objective  Right Lower Neck - Posterior: 7 x 52mm irregular speckled brown macule     Objective  Right upper sternum: 5 x 43mm pink/brown pearly macule      Assessment & Plan  AK (actinic keratosis) Scalp  Start 5FU with calcipotriene cream BID x 7-10 days     calcipotriene (DOVONOX) 0.005 % cream - Scalp  fluorouracil (EFUDEX) 5 % cream - Scalp  Erythema intertrigo Right and Left Medial Thigh,  axilla  Start ketoconazole 2% cream QD-BID to AA's PRN rash/itch #60 2RF  Discussed H&S as body wash Zeasorb AF powder to body folds  Ordered Medications: ketoconazole (NIZORAL) 2 % cream  History of basal cell carcinoma (BCC) Right Forehead, left antihelix  Clear. Observe for recurrence. Call clinic for new or changing lesions.  Recommend regular skin exams, daily broad-spectrum spf 30+ sunscreen use, and photoprotection.     Inflamed seborrheic keratosis (2) Right Shoulder - Posterior x 1, R mid back x 1  Cryotherapy to ISKs back today, will treat ISKs axilla on follow-up after rash cleared.  Destruction of lesion - Right Shoulder - Posterior x 1, R mid back x 1 Complexity: simple   Destruction method: cryotherapy   Informed consent: discussed and consent obtained   Lesion destroyed using liquid nitrogen: Yes   Region frozen until ice ball extended beyond lesion: Yes   Outcome: patient tolerated procedure well with no complications   Post-procedure details: wound care instructions given    Neoplasm of uncertain behavior of skin (2) Right Lower Neck - Posterior  Skin / nail biopsy Type of biopsy: tangential   Informed consent: discussed and consent obtained   Patient was prepped and draped in usual sterile fashion: Area prepped with alcohol. Anesthesia: the lesion was anesthetized in a standard fashion   Anesthetic:  1% lidocaine w/ epinephrine 1-100,000 buffered w/ 8.4% NaHCO3 Instrument used: flexible razor blade   Hemostasis achieved with: pressure,  aluminum chloride and electrodesiccation   Outcome: patient tolerated procedure well   Post-procedure details: wound care instructions given   Post-procedure details comment:  Ointment and small bandage applied  Specimen 1 - Surgical pathology Differential Diagnosis: Lentigo r/o Atypia Check Margins: No 7 x 27mm speckled brown macule  Right upper sternum  Skin / nail biopsy Type of biopsy: tangential   Informed  consent: discussed and consent obtained   Patient was prepped and draped in usual sterile fashion: Area prepped with alcohol. Anesthesia: the lesion was anesthetized in a standard fashion   Anesthetic:  1% lidocaine w/ epinephrine 1-100,000 buffered w/ 8.4% NaHCO3 Instrument used: flexible razor blade   Hemostasis achieved with: pressure, aluminum chloride and electrodesiccation   Outcome: patient tolerated procedure well   Post-procedure details: wound care instructions given   Post-procedure details comment:  Ointment and small bandage applied  Specimen 2 - Surgical pathology Differential Diagnosis: AK r/o BCC Check Margins: No 5 x 27mm pink pearly macule  Seborrheic Keratoses - Stuck-on, waxy, tan-brown papules and plaques  - Discussed benign etiology and prognosis. - Observe - Call for any changes  Actinic Damage - diffuse scaly erythematous macules with underlying dyspigmentation - Recommend daily broad spectrum sunscreen SPF 30+ to sun-exposed areas, reapply every 2 hours as needed.  - Call for new or changing lesions.  Return in about 2 months (around 09/05/2019) for AK's scalp, ISK's axilla, follow up intertrigo.  Graciella Belton, RMA, am acting as scribe for Brendolyn Patty, MD .  Documentation: I have reviewed the above documentation for accuracy and completeness, and I agree with the above.  Brendolyn Patty MD

## 2019-07-06 NOTE — Patient Instructions (Addendum)
Recommend daily broad spectrum sunscreen SPF 30+ to sun-exposed areas, reapply every 2 hours as needed. Call for new or changing lesions.  Recommend shampoo with selenium sulfide as a body wash, may try Head & Shoulders Zinc or Nizoral.  May also use Zeasorb AF powder to help absorb sweat.  Ketoconazole 2% cream to affected areas twice a day for 2 weeks then once a day as needed for rash.   Wound Care Instructions  1. Cleanse wound gently with soap and water once a day then pat dry with clean gauze. Apply a thing coat of Petrolatum (petroleum jelly, "Vaseline") over the wound (unless you have an allergy to this). We recommend that you use a new, sterile tube of Vaseline. Do not pick or remove scabs. Do not remove the yellow or white "healing tissue" from the base of the wound.  2. Cover the wound with fresh, clean, nonstick gauze and secure with paper tape. You may use Band-Aids in place of gauze and tape if the would is small enough, but would recommend trimming much of the tape off as there is often too much. Sometimes Band-Aids can irritate the skin.  3. You should call the office for your biopsy report after 1 week if you have not already been contacted.  4. If you experience any problems, such as abnormal amounts of bleeding, swelling, significant bruising, significant pain, or evidence of infection, please call the office immediately.  5. FOR ADULT SURGERY PATIENTS: If you need something for pain relief you may take 1 extra strength Tylenol (acetaminophen) AND 2 Ibuprofen (200mg  each) together every 4 hours as needed for pain. (do not take these if you are allergic to them or if you have a reason you should not take them.) Typically, you may only need pain medication for 1 to 3 days.    5-Fluorouracil/Calcipotriene Patient Education   Actinic keratoses are the dry, red scaly spots on the skin caused by sun damage. A portion of these spots can turn into skin cancer with time, and treating  them can help prevent development of skin cancer.   Treatment of these spots requires removal of the defective skin cells. There are various ways to remove actinic keratoses, including freezing with liquid nitrogen, treatment with creams, or treatment with a blue light procedure in the office.   5-fluorouracil cream is a topical cream used to treat actinic keratoses. It works by interfering with the growth of abnormal fast-growing skin cells, such as actinic keratoses. These cells peel off and are replaced by healthy ones.   5-fluorouracil/calcipotriene is a combination of the 5-fluorouracil cream with a vitamin D analog cream called calcipotriene. The calcipotriene alone does not treat actinic keratoses. However, when it is combined with 5-fluorouracil, it helps the 5-fluorouracil treat the actinic keratoses much faster so that the same results can be achieved with a much shorter treatment time.  INSTRUCTIONS FOR 5-FLUOROURACIL/CALCIPOTRIENE CREAM:   5-fluorouracil/calcipotriene cream typically only needs to be used for 4-7 days. A thin layer should be applied twice a day to the treatment areas recommended by your physician.   If your physician prescribed you separate tubes of 5-fluourouracil and calcipotriene, apply a thin layer of 5-fluorouracil followed by a thin layer of calcipotriene.   Avoid contact with your eyes, nostrils, and mouth. Do not use 5-fluorouracil/calcipotriene cream on infected or open wounds.   You will develop redness, irritation and some crusting at areas where you have pre-cancer damage/actinic keratoses. IF YOU DEVELOP PAIN, BLEEDING, OR SIGNIFICANT  CRUSTING, STOP THE TREATMENT EARLY - you have already gotten a good response and the actinic keratoses should clear up well.  Wash your hands after applying 5-fluorouracil 5% cream on your skin.   A moisturizer or sunscreen with a minimum SPF 30 should be applied each morning.   Once you have finished the treatment, you  can apply a thin layer of Vaseline twice a day to irritated areas to soothe and calm the areas more quickly. If you experience significant discomfort, contact your physician.  For some patients it is necessary to repeat the treatment for best results.  SIDE EFFECTS: When using 5-fluorouracil/calcipotriene cream, you may have mild irritation, such as redness, dryness, swelling, or a mild burning sensation. This usually resolves within 2 weeks. The more actinic keratoses you have, the more redness and inflammation you can expect during treatment. Eye irritation has been reported rarely. If this occurs, please let us know.  If you have any trouble using this cream, please call the office. If you have any other questions about this information, please do not hesitate to ask me before you leave the office.

## 2019-07-13 ENCOUNTER — Telehealth: Payer: Self-pay

## 2019-07-13 NOTE — Telephone Encounter (Signed)
Patient left message asking how to use medication prescribed. Called patient left message to return my call.

## 2019-07-13 NOTE — Telephone Encounter (Signed)
Left message for pt to call for biopsy results.

## 2019-07-13 NOTE — Telephone Encounter (Signed)
-----   Message from Brendolyn Patty, MD sent at 07/12/2019  5:57 PM EDT ----- 1. Skin , right lower neck posterior SOLAR LENTIGO WITH INCREASED NUMBERS OF MELANOCYTES, LATERAL MARGIN INVOLVED 2. Skin , right upper sternum ACTINIC KERATOSIS WITH ACANTHOLYSIS  1. Freckle from sun- benign- observation 2. Precancer- needs LN2 if it recurs

## 2019-07-14 ENCOUNTER — Telehealth: Payer: Self-pay

## 2019-07-14 NOTE — Telephone Encounter (Signed)
Pt advised of bx results./sh 

## 2019-07-14 NOTE — Telephone Encounter (Signed)
-----   Message from Brendolyn Patty, MD sent at 07/12/2019  5:57 PM EDT ----- 1. Skin , right lower neck posterior SOLAR LENTIGO WITH INCREASED NUMBERS OF MELANOCYTES, LATERAL MARGIN INVOLVED 2. Skin , right upper sternum ACTINIC KERATOSIS WITH ACANTHOLYSIS  1. Freckle from sun- benign- observation 2. Precancer- needs LN2 if it recurs

## 2019-07-19 ENCOUNTER — Other Ambulatory Visit
Admission: RE | Admit: 2019-07-19 | Discharge: 2019-07-19 | Disposition: A | Discharge status: Home / Self Care | Payer: Medicare Other | Source: Ambulatory Visit | Attending: Internal Medicine | Admitting: Internal Medicine

## 2019-07-19 ENCOUNTER — Other Ambulatory Visit: Payer: Self-pay

## 2019-07-19 DIAGNOSIS — Z20822 Contact with and (suspected) exposure to covid-19: Secondary | ICD-10-CM | POA: Diagnosis not present

## 2019-07-19 DIAGNOSIS — Z01812 Encounter for preprocedural laboratory examination: Secondary | ICD-10-CM | POA: Insufficient documentation

## 2019-07-20 ENCOUNTER — Encounter: Payer: Self-pay | Admitting: Internal Medicine

## 2019-07-20 LAB — SARS CORONAVIRUS 2 (TAT 6-24 HRS): SARS Coronavirus 2: NEGATIVE

## 2019-07-21 ENCOUNTER — Ambulatory Visit
Admission: RE | Admit: 2019-07-21 | Discharge: 2019-07-21 | Disposition: A | Discharge status: Home / Self Care | Payer: Medicare Other | Source: Ambulatory Visit | Attending: Internal Medicine | Admitting: Internal Medicine

## 2019-07-21 ENCOUNTER — Ambulatory Visit: Payer: Medicare Other | Admitting: Certified Registered Nurse Anesthetist

## 2019-07-21 ENCOUNTER — Encounter
Admission: RE | Disposition: A | Discharge status: Home / Self Care | Payer: Self-pay | Source: Ambulatory Visit | Attending: Internal Medicine

## 2019-07-21 ENCOUNTER — Other Ambulatory Visit: Payer: Self-pay

## 2019-07-21 ENCOUNTER — Encounter: Payer: Self-pay | Admitting: Internal Medicine

## 2019-07-21 DIAGNOSIS — K640 First degree hemorrhoids: Secondary | ICD-10-CM | POA: Insufficient documentation

## 2019-07-21 DIAGNOSIS — K3189 Other diseases of stomach and duodenum: Secondary | ICD-10-CM | POA: Diagnosis not present

## 2019-07-21 DIAGNOSIS — Z85828 Personal history of other malignant neoplasm of skin: Secondary | ICD-10-CM | POA: Insufficient documentation

## 2019-07-21 DIAGNOSIS — K449 Diaphragmatic hernia without obstruction or gangrene: Secondary | ICD-10-CM | POA: Diagnosis not present

## 2019-07-21 DIAGNOSIS — F419 Anxiety disorder, unspecified: Secondary | ICD-10-CM | POA: Diagnosis not present

## 2019-07-21 DIAGNOSIS — D125 Benign neoplasm of sigmoid colon: Secondary | ICD-10-CM | POA: Insufficient documentation

## 2019-07-21 DIAGNOSIS — I10 Essential (primary) hypertension: Secondary | ICD-10-CM | POA: Insufficient documentation

## 2019-07-21 DIAGNOSIS — R933 Abnormal findings on diagnostic imaging of other parts of digestive tract: Secondary | ICD-10-CM | POA: Diagnosis present

## 2019-07-21 DIAGNOSIS — K760 Fatty (change of) liver, not elsewhere classified: Secondary | ICD-10-CM | POA: Insufficient documentation

## 2019-07-21 DIAGNOSIS — K7030 Alcoholic cirrhosis of liver without ascites: Secondary | ICD-10-CM | POA: Diagnosis not present

## 2019-07-21 DIAGNOSIS — Z880 Allergy status to penicillin: Secondary | ICD-10-CM | POA: Diagnosis not present

## 2019-07-21 DIAGNOSIS — Z79899 Other long term (current) drug therapy: Secondary | ICD-10-CM | POA: Diagnosis not present

## 2019-07-21 DIAGNOSIS — Z1211 Encounter for screening for malignant neoplasm of colon: Secondary | ICD-10-CM | POA: Diagnosis not present

## 2019-07-21 DIAGNOSIS — Z885 Allergy status to narcotic agent status: Secondary | ICD-10-CM | POA: Insufficient documentation

## 2019-07-21 DIAGNOSIS — F329 Major depressive disorder, single episode, unspecified: Secondary | ICD-10-CM | POA: Diagnosis not present

## 2019-07-21 DIAGNOSIS — Z980 Intestinal bypass and anastomosis status: Secondary | ICD-10-CM | POA: Insufficient documentation

## 2019-07-21 DIAGNOSIS — K766 Portal hypertension: Secondary | ICD-10-CM | POA: Insufficient documentation

## 2019-07-21 DIAGNOSIS — K5730 Diverticulosis of large intestine without perforation or abscess without bleeding: Secondary | ICD-10-CM | POA: Diagnosis not present

## 2019-07-21 DIAGNOSIS — K635 Polyp of colon: Secondary | ICD-10-CM | POA: Insufficient documentation

## 2019-07-21 DIAGNOSIS — Z9049 Acquired absence of other specified parts of digestive tract: Secondary | ICD-10-CM | POA: Insufficient documentation

## 2019-07-21 DIAGNOSIS — Z91013 Allergy to seafood: Secondary | ICD-10-CM | POA: Diagnosis not present

## 2019-07-21 DIAGNOSIS — Z85038 Personal history of other malignant neoplasm of large intestine: Secondary | ICD-10-CM | POA: Diagnosis present

## 2019-07-21 DIAGNOSIS — D123 Benign neoplasm of transverse colon: Secondary | ICD-10-CM | POA: Diagnosis not present

## 2019-07-21 HISTORY — PX: ESOPHAGOGASTRODUODENOSCOPY: SHX5428

## 2019-07-21 HISTORY — DX: Diverticulosis of intestine, part unspecified, without perforation or abscess without bleeding: K57.90

## 2019-07-21 HISTORY — DX: Anxiety disorder, unspecified: F41.9

## 2019-07-21 HISTORY — PX: COLONOSCOPY WITH PROPOFOL: SHX5780

## 2019-07-21 HISTORY — DX: Unspecified hemorrhoids: K64.9

## 2019-07-21 HISTORY — DX: Personal history of other infectious and parasitic diseases: Z86.19

## 2019-07-21 SURGERY — EGD (ESOPHAGOGASTRODUODENOSCOPY)
Anesthesia: General

## 2019-07-21 MED ORDER — PROPOFOL 10 MG/ML IV BOLUS
INTRAVENOUS | Status: AC
  Filled 2019-07-21: qty 40

## 2019-07-21 MED ORDER — SODIUM CHLORIDE 0.9 % IV SOLN
INTRAVENOUS | Status: DC
  Administered 2019-07-21: 20 mL/h via INTRAVENOUS

## 2019-07-21 MED ORDER — PROPOFOL 500 MG/50ML IV EMUL
INTRAVENOUS | Status: DC | PRN
  Administered 2019-07-21: 100 ug/kg/min via INTRAVENOUS

## 2019-07-21 MED ORDER — PHENYLEPHRINE HCL (PRESSORS) 10 MG/ML IV SOLN
INTRAVENOUS | Status: DC | PRN
  Administered 2019-07-21 (×2): 100 ug via INTRAVENOUS

## 2019-07-21 MED ORDER — PROPOFOL 10 MG/ML IV BOLUS
INTRAVENOUS | Status: DC | PRN
  Administered 2019-07-21: 40 mg via INTRAVENOUS

## 2019-07-21 MED ORDER — LIDOCAINE HCL (CARDIAC) PF 100 MG/5ML IV SOSY
PREFILLED_SYRINGE | INTRAVENOUS | Status: DC | PRN
  Administered 2019-07-21: 60 mg via INTRAVENOUS

## 2019-07-21 NOTE — Interval H&P Note (Signed)
History and Physical Interval Note:  07/21/2019 10:33 AM  Matthew Webster  has presented today for surgery, with the diagnosis of ALCOHOLIC CIRRHOSIS,HISTORY OF COLON CANCER.  The various methods of treatment have been discussed with the patient and family. After consideration of risks, benefits and other options for treatment, the patient has consented to  Procedure(s): ESOPHAGOGASTRODUODENOSCOPY (EGD) (N/A) COLONOSCOPY WITH PROPOFOL (N/A) as a surgical intervention.  The patient's history has been reviewed, patient examined, no change in status, stable for surgery.  I have reviewed the patient's chart and labs.  Questions were answered to the patient's satisfaction.     [place], [place]

## 2019-07-21 NOTE — Transfer of Care (Signed)
Immediate Anesthesia Transfer of Care Note  Patient: Matthew Webster  Procedure(s) Performed: ESOPHAGOGASTRODUODENOSCOPY (EGD) (N/A ) COLONOSCOPY WITH PROPOFOL (N/A )  Patient Location: PACU  Anesthesia Type:General  Level of Consciousness: awake  Airway & Oxygen Therapy: Patient Spontanous Breathing  Post-op Assessment: Report given to RN  Post vital signs: Reviewed and stable  Last Vitals:  Vitals Value Taken Time  BP 111/74 07/21/19 1115  Temp 36.5 C 07/21/19 1115  Pulse 63 07/21/19 1116  Resp 15 07/21/19 1116  SpO2 96 % 07/21/19 1116  Vitals shown include unvalidated device data.  Last Pain:  Vitals:   07/21/19 1113  TempSrc: Temporal  PainSc: 0-No pain         Complications: No complications documented.

## 2019-07-21 NOTE — Interval H&P Note (Signed)
History and Physical Interval Note:  07/21/2019 10:41 AM  Matthew Webster  has presented today for surgery, with the diagnosis of ALCOHOLIC CIRRHOSIS,HISTORY OF COLON CANCER.  The various methods of treatment have been discussed with the patient and family. After consideration of risks, benefits and other options for treatment, the patient has consented to  Procedure(s): ESOPHAGOGASTRODUODENOSCOPY (EGD) (N/A) COLONOSCOPY WITH PROPOFOL (N/A) as a surgical intervention.  The patient's history has been reviewed, patient examined, no change in status, stable for surgery.  I have reviewed the patient's chart and labs.  Questions were answered to the patient's satisfaction.     [name], [name]

## 2019-07-21 NOTE — H&P (Signed)
Outpatient short stay form Pre-procedure 07/21/2019 10:40 AM Simren Popson K. Alice Reichert, M.D.  Primary Physician: Juluis Pitch, M.D.  Reason for visit:  Presumptive cirrhosis, portal hypertension.  History of present illness:  As above. Patient denies intractable heartburn, dysphagia, hemetemesis, abdominal pain, nausea or vomiting.     Current Facility-Administered Medications:  .  0.9 %  sodium chloride infusion, , Intravenous, Continuous, [place], Benay Pike, MD, Last Rate: 20 mL/hr at 07/21/19 4008, 20 mL/hr at 07/21/19 6761  Medications Prior to Admission  Medication Sig Dispense Refill Last Dose  . amLODipine (NORVASC) 5 MG tablet Take 5 mg by mouth daily.   07/20/2019 at Unknown time  . buPROPion (WELLBUTRIN SR) 100 MG 12 hr tablet Take 100 mg by mouth daily.   Past Week at Unknown time  . calcipotriene (DOVONOX) 0.005 % cream Apply topically 2 (two) times daily. 60 g 0 Past Week at Unknown time  . fluorouracil (EFUDEX) 5 % cream Apply topically in the morning and at bedtime. 40 g 0 Past Week at Unknown time  . ketoconazole (NIZORAL) 2 % cream Apply 1 application topically in the morning and at bedtime. 60 g 2 Past Week at Unknown time  . latanoprost (XALATAN) 0.005 % ophthalmic solution   0 Past Week at Unknown time  . losartan (COZAAR) 50 MG tablet Take 50 mg by mouth daily.   07/20/2019 at Unknown time  . losartan-hydrochlorothiazide (HYZAAR) 50-12.5 MG tablet Take by mouth.   Past Week at Unknown time  . sildenafil (REVATIO) 20 MG tablet Take 20 mg by mouth 3 (three) times daily as needed.   Past Week at Unknown time  . tamsulosin (FLOMAX) 0.4 MG CAPS capsule Take 1 capsule (0.4 mg total) by mouth daily. 90 capsule 3 07/20/2019 at Unknown time  . timolol (TIMOPTIC) 0.5 % ophthalmic solution instill 1 drop into left eye every morning  0 Past Week at Unknown time  . venlafaxine XR (EFFEXOR-XR) 150 MG 24 hr capsule   0 07/20/2019 at Unknown time     Allergies  Allergen Reactions  .  Alphagan P [Brimonidine Tartrate] Swelling    Eye redness, swelling, and discharge  . Codeine     constipation  . Shellfish Allergy Diarrhea and Nausea And Vomiting    Raw clams  . Penicillin V Potassium Rash  . Penicillins Rash     Past Medical History:  Diagnosis Date  . Actinic keratosis   . Anxiety   . Benign non-nodular prostatic hyperplasia with lower urinary tract symptoms   . Cancer of sigmoid colon (Accident) 06/20/2014  . Depression 11/24/2013   Overview:  Questionable bipolar  . Diverticulosis   . Fatty liver    on CT  . Hemorrhoids   . History of chicken pox   . Hx of basal cell carcinoma 12/24/2016   Left anterior ear helix  . Hx of basal cell carcinoma 06/24/2017   Left upper forehead  . Hx of basal cell carcinoma 08/14/2017   Right upper forehead  . Hypertension 11/24/2013  . Personal history of colonic polyps 02/24/2014    Review of systems:  Otherwise negative.    Physical Exam  Gen: Alert, oriented. Appears stated age.  HEENT: [place]/AT. PERRLA. Lungs: CTA, no wheezes. CV: RR nl S1, S2. Abd: soft, benign, no masses. BS+ Ext: No edema. Pulses 2+    Planned procedures: Proceed with EGD. The patient understands the nature of the planned procedure, indications, risks, alternatives and potential complications including but not limited to bleeding,  infection, perforation, damage to internal organs and possible oversedation/side effects from anesthesia. The patient agrees and gives consent to proceed.  Please refer to procedure notes for findings, recommendations and patient disposition/instructions.     Mercer Stallworth K. Alice Reichert, M.D. Gastroenterology 07/21/2019  10:40 AM

## 2019-07-21 NOTE — Op Note (Signed)
[facility] Gastroenterology Patient Name: Matthew Webster Procedure Date: 07/21/2019 10:22 AM MRN: [id number] Account #: [id number] Date of Birth: July 21, 1950 Admit Type: Outpatient Age: 69 Room: [facility] ENDO ROOM 3 Gender: Male Note Status: Finalized Procedure:             Upper GI endoscopy Indications:           Abnormal CT of the GI tract, CIrrhosis of the liver,                         Portal venous hypertension Providers:             Benay Pike. Illya Gienger MD, MD Medicines:             Propofol per Anesthesia Complications:         No immediate complications. Procedure:             Pre-Anesthesia Assessment:                        - The risks and benefits of the procedure and the                         sedation options and risks were discussed with the                         patient. All questions were answered and informed                         consent was obtained.                        - Patient identification and proposed procedure were                         verified prior to the procedure by the nurse. The                         procedure was verified in the procedure room.                        - ASA Grade Assessment: III - A patient with severe                         systemic disease.                        - After reviewing the risks and benefits, the patient                         was deemed in satisfactory condition to undergo the                         procedure.                        After obtaining informed consent, the endoscope was                         passed under direct vision. Throughout the procedure,  the patient's blood pressure, pulse, and oxygen                         saturations were monitored continuously. The Endoscope                         was introduced through the mouth, and advanced to the                         third part of duodenum. The upper GI endoscopy was                          accomplished without difficulty. The patient tolerated                         the procedure well. Findings:      The examined esophagus was normal.      There is no endoscopic evidence of varices in the entire esophagus.      Mild portal hypertensive gastropathy was found in the entire examined       stomach.      Patchy moderate inflammation characterized by congestion (edema),       erosions and erythema was found in the gastric antrum. Biopsies were       taken with a cold forceps for Helicobacter pylori testing.      The examined duodenum was normal.      A 1 cm hiatal hernia was present.      The exam was otherwise without abnormality. Impression:            - Normal esophagus.                        - Portal hypertensive gastropathy.                        - Gastritis. Biopsied.                        - Normal examined duodenum.                        - The examination was otherwise normal. Recommendation:        - Await pathology results.                        - Repeat upper endoscopy in 2 years for screening                         purposes.                        - Proceed with colonoscopy Procedure Code(s):     --- Professional ---                        [phone], Esophagogastroduodenoscopy, flexible,                         transoral; with biopsy, single or multiple Diagnosis Code(s):     --- Professional ---  R93.3, Abnormal findings on diagnostic imaging of                         other parts of digestive tract                        K29.70, Gastritis, unspecified, without bleeding                        K31.89, Other diseases of stomach and duodenum                        K76.6, Portal hypertension CPT copyright 2019 American [employer] Association. All rights reserved. The codes documented in this report are preliminary and upon coder review may  be revised to meet current compliance requirements. Efrain Sella MD, MD 07/21/2019 10:50:29 AM This  report has been signed electronically. Number of Addenda: 0 Note Initiated On: 07/21/2019 10:22 AM Estimated Blood Loss:  Estimated blood loss: none. Estimated blood loss: none.      [facility]

## 2019-07-21 NOTE — Anesthesia Preprocedure Evaluation (Signed)
Anesthesia Evaluation  Patient identified by MRN, date of birth, ID band Patient awake    Reviewed: Allergy & Precautions, H&P , NPO status , Patient's Chart, lab work & pertinent test results  Airway Mallampati: III  TM Distance: <3 FB Neck ROM: full    Dental  (+) Chipped, Poor Dentition   Pulmonary neg pulmonary ROS, neg shortness of breath,           Cardiovascular Exercise Tolerance: Good hypertension, (-) angina(-) Past MI and (-) DOE      Neuro/Psych PSYCHIATRIC DISORDERS Anxiety Depression negative neurological ROS     GI/Hepatic Neg liver ROS, neg GERD  ,  Endo/Other  negative endocrine ROS  Renal/GU negative Renal ROS  negative genitourinary   Musculoskeletal   Abdominal   Peds negative pediatric ROS (+)  Hematology negative hematology ROS (+)   Anesthesia Other Findings Past Medical History: No date: Benign non-nodular prostatic hyperplasia with lower urinary  tract symptoms 06/20/2014: Cancer of sigmoid colon ([place]) 11/24/2013: Depression     Comment:  Overview:  Questionable bipolar No date: Fatty liver     Comment:  on CT 11/24/2013: Hypertension  Past Surgical History: 2011: CATARACT EXTRACTION W/ INTRAOCULAR LENS IMPLANT; Left No date: LAPAROSCOPIC BOWEL RESECTION No date: RETINAL DETACHMENT SURGERY No date: TONSILLECTOMY  BMI    Body Mass Index:  28.29 kg/m      Reproductive/Obstetrics negative OB ROS                             Anesthesia Physical  Anesthesia Plan  ASA: III  Anesthesia Plan: General   Post-op Pain Management:    Induction: Intravenous  PONV Risk Score and Plan: Propofol infusion and TIVA  Airway Management Planned: Natural Airway and Nasal Cannula  Additional Equipment:   Intra-op Plan:   Post-operative Plan:   Informed Consent: I have reviewed the patients History and Physical, chart, labs and discussed the procedure  including the risks, benefits and alternatives for the proposed anesthesia with the patient or authorized representative who has indicated his/her understanding and acceptance.     Dental Advisory Given  Plan Discussed with: Anesthesiologist, CRNA and Surgeon  Anesthesia Plan Comments: (Patient consented for risks of anesthesia including but not limited to:  - adverse reactions to medications - risk of intubation if required - damage to teeth, lips or other oral mucosa - sore throat or hoarseness - Damage to heart, brain, lungs or loss of life  Patient voiced understanding.)        Anesthesia Quick Evaluation

## 2019-07-21 NOTE — Op Note (Signed)
[facility] Gastroenterology Patient Name: Matthew Webster Procedure Date: 07/21/2019 10:21 AM MRN: [id number] Account #: [id number] Date of Birth: [birth year] Admit Type: Outpatient Age: 69 Room: [facility] ENDO ROOM 3 Gender: Male Note Status: Finalized Procedure:             Colonoscopy Indications:           High risk colon cancer surveillance: Personal history                         of colonic polyps, High risk colon cancer                         surveillance: Personal history of colon cancer Providers:             Benay Pike. Pax Reasoner MD, MD Medicines:             Propofol per Anesthesia Complications:         No immediate complications. Procedure:             Pre-Anesthesia Assessment:                        - The risks and benefits of the procedure and the                         sedation options and risks were discussed with the                         patient. All questions were answered and informed                         consent was obtained.                        - Patient identification and proposed procedure were                         verified prior to the procedure by the nurse. The                         procedure was verified in the procedure room.                        - ASA Grade Assessment: III - A patient with severe                         systemic disease.                        - After reviewing the risks and benefits, the patient                         was deemed in satisfactory condition to undergo the                         procedure.                        After obtaining informed consent, the colonoscope was  passed under direct vision. Throughout the procedure,                         the patient's blood pressure, pulse, and oxygen                         saturations were monitored continuously. The                         Colonoscope was introduced through the anus and                         advanced to the the  cecum, identified by appendiceal                         orifice and ileocecal valve. The colonoscopy was                         performed without difficulty. The patient tolerated                         the procedure well. The quality of the bowel                         preparation was adequate. Findings:      The perianal and digital rectal examinations were normal. Pertinent       negatives include normal sphincter tone and no palpable rectal lesions.      Many small and large-mouthed diverticula were found in the left colon.      There was evidence of a prior end-to-end colo-colonic anastomosis in the       sigmoid colon. This was patent and was characterized by healthy       appearing mucosa. The anastomosis was traversed. Estimated blood loss:       none.      Three sessile polyps were found in the sigmoid colon, distal sigmoid       colon and transverse colon. The polyps were 3 to 5 mm in size. These       polyps were removed with a cold biopsy forceps. Resection and retrieval       were complete.      Non-bleeding internal hemorrhoids were found during retroflexion. The       hemorrhoids were Grade I (internal hemorrhoids that do not prolapse).      The exam was otherwise without abnormality. Impression:            - Diverticulosis in the left colon.                        - Patent end-to-end colo-colonic anastomosis,                         characterized by healthy appearing mucosa.                        - Three 3 to 5 mm polyps in the sigmoid colon, in the                         distal sigmoid colon and in the transverse colon,  removed with a cold biopsy forceps. Resected and                         retrieved.                        - Non-bleeding internal hemorrhoids.                        - The examination was otherwise normal. Recommendation:        - Patient has a contact number available for                         emergencies. The signs and  symptoms of potential                         delayed complications were discussed with the patient.                         Return to normal activities tomorrow. Written                         discharge instructions were provided to the patient.                        - Resume previous diet.                        - Continue present medications.                        - Await pathology results.                        - Repeat colonoscopy is recommended for surveillance.                         The colonoscopy date will be determined after                         pathology results from today's exam become available                         for review.                        - Repeat EGD for variceal screening/surveillance in {                         } years.                        - Return to nurse practitioner in 3 months.                        - Follow up with Stephens November, GI Nurse                         Practioner, in office to discuss results and monitor  progress.                        - The findings and recommendations were discussed with                         the patient. Procedure Code(s):     --- Professional ---                        [phone], Colonoscopy, flexible; with biopsy, single or                         multiple Diagnosis Code(s):     --- Professional ---                        K57.30, Diverticulosis of large intestine without                         perforation or abscess without bleeding                        K63.5, Polyp of colon                        Z98.0, Intestinal bypass and anastomosis status                        K64.0, First degree hemorrhoids                        Z85.038, Personal history of other malignant neoplasm                         of large intestine                        Z86.010, Personal history of colonic polyps CPT copyright 2019 American Medical Association. All rights reserved. The codes documented in  this report are preliminary and upon coder review may  be revised to meet current compliance requirements. Efrain Sella MD, MD 07/21/2019 11:15:11 AM This report has been signed electronically. Number of Addenda: 0 Note Initiated On: 07/21/2019 10:21 AM Scope Withdrawal Time: 0 hours 12 minutes 26 seconds  Total Procedure Duration: 0 hours 16 minutes 20 seconds  Estimated Blood Loss:  Estimated blood loss: none.      [facility]

## 2019-07-21 NOTE — H&P (Signed)
Outpatient short stay form Pre-procedure 07/21/2019 10:32 AM Matthew Webster K. Alice Reichert, M.D.  Primary Physician: Juluis Pitch, M.D.  Reason for visit:  Personal history of colon cancer, personal history of colon polyps.  History of present illness:                            Patient presents for colonoscopy for a personal hx of colon polyps and cancer s/p partial colectomy. The patient denies abdominal pain, abnormal weight loss or rectal bleeding.      Current Facility-Administered Medications:  .  0.9 %  sodium chloride infusion, , Intravenous, Continuous, [place], Benay Pike, MD, Last Rate: 20 mL/hr at 07/21/19 6213, 20 mL/hr at 07/21/19 0865  Medications Prior to Admission  Medication Sig Dispense Refill Last Dose  . amLODipine (NORVASC) 5 MG tablet Take 5 mg by mouth daily.   07/20/2019 at Unknown time  . buPROPion (WELLBUTRIN SR) 100 MG 12 hr tablet Take 100 mg by mouth daily.   Past Week at Unknown time  . calcipotriene (DOVONOX) 0.005 % cream Apply topically 2 (two) times daily. 60 g 0 Past Week at Unknown time  . fluorouracil (EFUDEX) 5 % cream Apply topically in the morning and at bedtime. 40 g 0 Past Week at Unknown time  . ketoconazole (NIZORAL) 2 % cream Apply 1 application topically in the morning and at bedtime. 60 g 2 Past Week at Unknown time  . latanoprost (XALATAN) 0.005 % ophthalmic solution   0 Past Week at Unknown time  . losartan (COZAAR) 50 MG tablet Take 50 mg by mouth daily.   07/20/2019 at Unknown time  . losartan-hydrochlorothiazide (HYZAAR) 50-12.5 MG tablet Take by mouth.   Past Week at Unknown time  . sildenafil (REVATIO) 20 MG tablet Take 20 mg by mouth 3 (three) times daily as needed.   Past Week at Unknown time  . tamsulosin (FLOMAX) 0.4 MG CAPS capsule Take 1 capsule (0.4 mg total) by mouth daily. 90 capsule 3 07/20/2019 at Unknown time  . timolol (TIMOPTIC) 0.5 % ophthalmic solution instill 1 drop into left eye every morning  0 Past Week at Unknown time  .  venlafaxine XR (EFFEXOR-XR) 150 MG 24 hr capsule   0 07/20/2019 at Unknown time     Allergies  Allergen Reactions  . Alphagan P [Brimonidine Tartrate] Swelling    Eye redness, swelling, and discharge  . Codeine     constipation  . Shellfish Allergy Diarrhea and Nausea And Vomiting    Raw clams  . Penicillin V Potassium Rash  . Penicillins Rash     Past Medical History:  Diagnosis Date  . Actinic keratosis   . Anxiety   . Benign non-nodular prostatic hyperplasia with lower urinary tract symptoms   . Cancer of sigmoid colon ([place]) 06/20/2014  . Depression 11/24/2013   Overview:  Questionable bipolar  . Diverticulosis   . Fatty liver    on CT  . Hemorrhoids   . History of chicken pox   . Hx of basal cell carcinoma 12/24/2016   Left anterior ear helix  . Hx of basal cell carcinoma 06/24/2017   Left upper forehead  . Hx of basal cell carcinoma 08/14/2017   Right upper forehead  . Hypertension 11/24/2013  . Personal history of colonic polyps 02/24/2014    Review of systems:  Otherwise negative.    Physical Exam  Gen: Alert, oriented. Appears stated age.  HEENT: [place]/AT. PERRLA. Lungs: CTA,  no wheezes. CV: RR nl S1, S2. Abd: soft, benign, no masses. BS+ Ext: No edema. Pulses 2+    Planned procedures: Proceed with colonoscopy. The patient understands the nature of the planned procedure, indications, risks, alternatives and potential complications including but not limited to bleeding, infection, perforation, damage to internal organs and possible oversedation/side effects from anesthesia. The patient agrees and gives consent to proceed.  Please refer to procedure notes for findings, recommendations and patient disposition/instructions.     Claudette Wermuth K. Alice Reichert, M.D. Gastroenterology 07/21/2019  10:32 AM

## 2019-07-21 NOTE — Interval H&P Note (Signed)
History and Physical Interval Note:  07/21/2019 10:33 AM  Matthew Webster  has presented today for surgery, with the diagnosis of ALCOHOLIC CIRRHOSIS,HISTORY OF COLON CANCER.  The various methods of treatment have been discussed with the patient and family. After consideration of risks, benefits and other options for treatment, the patient has consented to  Procedure(s): ESOPHAGOGASTRODUODENOSCOPY (EGD) (N/A) COLONOSCOPY WITH PROPOFOL (N/A) as a surgical intervention.  The patient's history has been reviewed, patient examined, no change in status, stable for surgery.  I have reviewed the patient's chart and labs.  Questions were answered to the patient's satisfaction.     [place], [place]

## 2019-07-21 NOTE — Anesthesia Postprocedure Evaluation (Signed)
Anesthesia Post Note  Patient: Matthew Webster  Procedure(s) Performed: ESOPHAGOGASTRODUODENOSCOPY (EGD) (N/A ) COLONOSCOPY WITH PROPOFOL (N/A )  Patient location during evaluation: Endoscopy Anesthesia Type: General Level of consciousness: awake and alert and oriented Pain management: pain level controlled Vital Signs Assessment: post-procedure vital signs reviewed and stable Respiratory status: spontaneous breathing Cardiovascular status: blood pressure returned to baseline Anesthetic complications: no   No complications documented.   Last Vitals:  Vitals:   07/21/19 1123 07/21/19 1133  BP: 124/79 136/85  Pulse: 63 63  Resp: 17 14  Temp:    SpO2: 93% 96%    Last Pain:  Vitals:   07/21/19 1133  TempSrc:   PainSc: 0-No pain                 Ramel Tobon

## 2019-07-22 ENCOUNTER — Encounter: Payer: Self-pay | Admitting: Internal Medicine

## 2019-07-22 LAB — SURGICAL PATHOLOGY

## 2019-09-07 ENCOUNTER — Ambulatory Visit: Payer: Medicare Other | Admitting: Dermatology

## 2019-12-17 ENCOUNTER — Other Ambulatory Visit: Payer: Self-pay | Admitting: Family Medicine

## 2019-12-17 DIAGNOSIS — R972 Elevated prostate specific antigen [PSA]: Secondary | ICD-10-CM

## 2019-12-22 ENCOUNTER — Other Ambulatory Visit: Payer: Medicare Other

## 2019-12-22 ENCOUNTER — Other Ambulatory Visit: Payer: Self-pay

## 2019-12-22 DIAGNOSIS — R972 Elevated prostate specific antigen [PSA]: Secondary | ICD-10-CM

## 2019-12-23 LAB — PSA: Prostate Specific Ag, Serum: 4 ng/mL (ref 0.0–4.0)

## 2019-12-29 ENCOUNTER — Ambulatory Visit (INDEPENDENT_AMBULATORY_CARE_PROVIDER_SITE_OTHER): Payer: Medicare Other | Admitting: Urology

## 2019-12-29 ENCOUNTER — Other Ambulatory Visit: Payer: Self-pay

## 2019-12-29 ENCOUNTER — Encounter: Payer: Self-pay | Admitting: Urology

## 2019-12-29 VITALS — BP 171/100 | HR 87 | Ht 66.0 in | Wt 197.0 lb

## 2019-12-29 DIAGNOSIS — R972 Elevated prostate specific antigen [PSA]: Secondary | ICD-10-CM

## 2019-12-29 DIAGNOSIS — N5203 Combined arterial insufficiency and corporo-venous occlusive erectile dysfunction: Secondary | ICD-10-CM | POA: Diagnosis not present

## 2019-12-29 DIAGNOSIS — N401 Enlarged prostate with lower urinary tract symptoms: Secondary | ICD-10-CM

## 2019-12-29 DIAGNOSIS — N138 Other obstructive and reflux uropathy: Secondary | ICD-10-CM | POA: Diagnosis not present

## 2019-12-29 LAB — BLADDER SCAN AMB NON-IMAGING

## 2019-12-29 NOTE — Patient Instructions (Signed)
Transrectal Ultrasound Transrectal ultrasound is a procedure that uses sound waves to create images of your prostate gland and nearby tissues. The sound waves are sent through the wall of your rectum into your prostate gland, which is located in front of your rectum. The images show the size and shape of your prostate gland and nearby structures. You may have this test if you have:  Trouble urinating.  Infertility.  An abnormal prostate screening exam. Tell a health care provider about:  Any allergies you have.  All medicines you are taking, including vitamins, herbs, eye drops, creams, and over-the-counter medicines.  Any blood disorders you have.  Any medical conditions you have.  Any surgeries you have had. What are the risks? Generally, this is a safe procedure. However, problems may occur, including:  Discomfort during the procedure. This is rare.  Blood in your urine or sperm after the procedure. This is rare. What happens before the procedure?  Your health care provider may instruct you to use an enema 1-4 hours before the procedure. Follow instructions from your health care provider about how to do the enema.  Ask your health care provider about changing or stopping your regular medicines. This is especially important if you are taking diabetes medicines or blood thinners. What happens during the procedure?  You will be asked to lie down on your left side on an examination table.  You will bend your knees toward your chest.  A lubricated probe will be gently inserted into your rectum. This may cause a feeling of fullness.  The probe will send signals to a computer that will create images.  The technician will slightly rotate the probe throughout the procedure. While rotating the probe, he or she will view and capture images of the prostate gland and the surrounding structures from different angles.  The probe will be removed. The procedure may vary among health care  providers and hospitals. What happens after the procedure?  It is up to you to get the results of your procedure. Ask your health care provider, or the department that is doing the procedure, when your results will be ready. Summary  Transrectal ultrasound is a procedure that uses sound waves to create images of your prostate gland and nearby tissues.  The images show the size and shape of your prostate gland and nearby structures.  Before the procedure, ask your health care provider about changing or stopping your regular medicines. This is especially important if you are taking diabetes medicines or blood thinners. This information is not intended to replace advice given to you by your health care provider. Make sure you discuss any questions you have with your health care provider. Document Revised: 12/27/2016 Document Reviewed: 12/08/2015 Elsevier Patient Education  2020 [employer]. Cystoscopy Cystoscopy is a procedure that is used to help diagnose and sometimes treat conditions that affect the lower urinary tract. The lower urinary tract includes the bladder and the urethra. The urethra is the tube that drains urine from the bladder. Cystoscopy is done using a thin, tube-shaped instrument with a light and camera at the end (cystoscope). The cystoscope may be hard or flexible, depending on the goal of the procedure. The cystoscope is inserted through the urethra, into the bladder. Cystoscopy may be recommended if you have:  Urinary tract infections that keep coming back.  Blood in the urine (hematuria).  An inability to control when you urinate (urinary incontinence) or an overactive bladder.  Unusual cells found in a urine sample.  A blockage in the urethra, such as a urinary stone.  Painful urination.  An abnormality in the bladder found during an intravenous pyelogram (IVP) or CT scan. Cystoscopy may also be done to remove a sample of tissue to be examined under a microscope  (biopsy). Tell a health care provider about:  Any allergies you have.  All medicines you are taking, including vitamins, herbs, eye drops, creams, and over-the-counter medicines.  Any problems you or family members have had with anesthetic medicines.  Any blood disorders you have.  Any surgeries you have had.  Any medical conditions you have.  Whether you are pregnant or may be pregnant. What are the risks? Generally, this is a safe procedure. However, problems may occur, including:  Infection.  Bleeding.  Allergic reactions to medicines.  Damage to other structures or organs. What happens before the procedure?  Ask your health care provider about: ? Changing or stopping your regular medicines. This is especially important if you are taking diabetes medicines or blood thinners. ? Taking medicines such as aspirin and ibuprofen. These medicines can thin your blood. Do not take these medicines unless your health care provider tells you to take them. ? Taking over-the-counter medicines, vitamins, herbs, and supplements.  Follow instructions from your health care provider about eating or drinking restrictions.  Ask your health care provider what steps will be taken to help prevent infection. These may include: ? Washing skin with a germ-killing soap. ? Taking antibiotic medicine.  You may have an exam or testing, such as: ? X-rays of the bladder, urethra, or kidneys. ? Urine tests to check for signs of infection.  Plan to have someone take you home from the hospital or clinic. What happens during the procedure?   You will be given one or more of the following: ? A medicine to help you relax (sedative). ? A medicine to numb the area (local anesthetic).  The area around the opening of your urethra will be cleaned.  The cystoscope will be passed through your urethra into your bladder.  Germ-free (sterile) fluid will flow through the cystoscope to fill your bladder. The  fluid will stretch your bladder so that your health care provider can clearly examine your bladder walls.  Your doctor will look at the urethra and bladder. Your doctor may take a biopsy or remove stones.  The cystoscope will be removed, and your bladder will be emptied. The procedure may vary among health care providers and hospitals. What can I expect after the procedure? After the procedure, it is common to have:  Some soreness or pain in your abdomen and urethra.  Urinary symptoms. These include: ? Mild pain or burning when you urinate. Pain should stop within a few minutes after you urinate. This may last for up to 1 week. ? A small amount of blood in your urine for several days. ? Feeling like you need to urinate but producing only a small amount of urine. Follow these instructions at home: Medicines  Take over-the-counter and prescription medicines only as told by your health care provider.  If you were prescribed an antibiotic medicine, take it as told by your health care provider. Do not stop taking the antibiotic even if you start to feel better. General instructions  Return to your normal activities as told by your health care provider. Ask your health care provider what activities are safe for you.  Do not drive for 24 hours if you were given a sedative during your procedure.  Watch for any blood in your urine. If the amount of blood in your urine increases, call your health care provider.  Follow instructions from your health care provider about eating or drinking restrictions.  If a tissue sample was removed for testing (biopsy) during your procedure, it is up to you to get your test results. Ask your health care provider, or the department that is doing the test, when your results will be ready.  Drink enough fluid to keep your urine pale yellow.  Keep all follow-up visits as told by your health care provider. This is important. Contact a health care provider if  you:  Have pain that gets worse or does not get better with medicine, especially pain when you urinate.  Have trouble urinating.  Have more blood in your urine. Get help right away if you:  Have blood clots in your urine.  Have abdominal pain.  Have a fever or chills.  Are unable to urinate. Summary  Cystoscopy is a procedure that is used to help diagnose and sometimes treat conditions that affect the lower urinary tract.  Cystoscopy is done using a thin, tube-shaped instrument with a light and camera at the end.  After the procedure, it is common to have some soreness or pain in your abdomen and urethra.  Watch for any blood in your urine. If the amount of blood in your urine increases, call your health care provider.  If you were prescribed an antibiotic medicine, take it as told by your health care provider. Do not stop taking the antibiotic even if you start to feel better. This information is not intended to replace advice given to you by your health care provider. Make sure you discuss any questions you have with your health care provider. Document Revised: 01/06/2018 Document Reviewed: 01/06/2018 Elsevier Patient Education  [employer].

## 2019-12-29 NOTE — Progress Notes (Signed)
12/29/2019 3:41 PM   Matthew Webster Jan 20, 1951 [id number]  Referring provider: Juluis Pitch, MD [phone] [place],  [place] [address]  Chief Complaint  Patient presents with  . Elevated PSA    HPI: 69 year old male who returns today for annual follow-up of history of elevated PSA as well as BPH with obstructive urinary symptoms.  He is s/p negative prostate biopsy in 04/2013 at which time his PSA was 5.0 ng/DL. TRUS volume 19 gm.MRI of his prostate in 2017 showed a PI-RADS 2 lesion, otherwise unremarkable.  He also has a longstanding history of obstructive urinary symptoms.    He is currently on Flomax and has been for many years.  His symptoms were originally very well controlled and now moderately so.  IPSS as below.  He reports that when he runs out of medication or forgets a dose, he has recurrence of his obstructive symptoms including weak stream and difficulty emptying.  In addition to the above, he mentions today that he occasionally takes Viagra for erectile dysfunction.  He wonders today if there is anything new that can help enhance his orgasms.  He does have some retrograde ejaculation.  PSA trend: 5.4 11/16 6.4 6/17 7.9 9/17 7.7 3/18 5.8 9/18 6.7 11/19 5.8  11/20 4.0  11/21    IPSS    Row Name 12/29/19 1500         International Prostate Symptom Score   How often have you had the sensation of not emptying your bladder? Less than half the time     How often have you had to urinate less than every two hours? Less than half the time     How often have you found you stopped and started again several times when you urinated? About half the time     How often have you found it difficult to postpone urination? More than half the time     How often have you had a weak urinary stream? Less than half the time     How often have you had to strain to start urination? Less than half the time     How many times did you typically get up at night to  urinate? 3 Times     Total IPSS Score 18       Quality of Life due to urinary symptoms   If you were to spend the rest of your life with your urinary condition just the way it is now how would you feel about that? Mostly Satisfied            Score:  1-7 Mild 8-19 Moderate 20-35 Severe  PMH: Past Medical History:  Diagnosis Date  . Actinic keratosis   . Anxiety   . Benign non-nodular prostatic hyperplasia with lower urinary tract symptoms   . Cancer of sigmoid colon ([place]) 06/20/2014  . Depression 11/24/2013   Overview:  Questionable bipolar  . Diverticulosis   . Fatty liver    on CT  . Hemorrhoids   . History of chicken pox   . Hx of basal cell carcinoma 12/24/2016   Left anterior ear helix  . Hx of basal cell carcinoma 06/24/2017   Left upper forehead  . Hx of basal cell carcinoma 08/14/2017   Right upper forehead  . Hypertension 11/24/2013  . Personal history of colonic polyps 02/24/2014    Surgical History: Past Surgical History:  Procedure Laterality Date  . CATARACT EXTRACTION W/ INTRAOCULAR LENS IMPLANT Left 2011  .  CATARACT EXTRACTION W/PHACO Right 05/26/2019   Procedure: CATARACT EXTRACTION PHACO AND INTRAOCULAR LENS PLACEMENT (IOC) RIGHT 10.64  01:10.2  15.1%;  Surgeon: Leandrew Koyanagi, MD;  Location: [facility];  Service: Ophthalmology;  Laterality: Right;  . COLON SURGERY    . COLONOSCOPY WITH PROPOFOL N/A 07/22/2017   Procedure: COLONOSCOPY WITH PROPOFOL;  Surgeon: Lollie Sails, MD;  Location: [facility] ENDOSCOPY;  Service: Endoscopy;  Laterality: N/A;  . COLONOSCOPY WITH PROPOFOL N/A 07/21/2019   Procedure: COLONOSCOPY WITH PROPOFOL;  Surgeon: Toledo, Benay Pike, MD;  Location: [facility] ENDOSCOPY;  Service: Gastroenterology;  Laterality: N/A;  . DIAGNOSTIC LAPAROSCOPY    . ESOPHAGOGASTRODUODENOSCOPY N/A 07/21/2019   Procedure: ESOPHAGOGASTRODUODENOSCOPY (EGD);  Surgeon: Toledo, Benay Pike, MD;  Location: [facility] ENDOSCOPY;  Service: Gastroenterology;   Laterality: N/A;  . EYE SURGERY     DETACHED RETINA  . LAPAROSCOPIC BOWEL RESECTION    . RETINAL DETACHMENT SURGERY    . TONSILLECTOMY    . WISDOM TOOTH EXTRACTION      Home Medications:  Allergies as of 12/29/2019      Reactions   Alphagan P [brimonidine Tartrate] Swelling   Eye redness, swelling, and discharge   Codeine    constipation   Shellfish Allergy Diarrhea, Nausea And Vomiting   Raw clams   Penicillin V Potassium Rash   Penicillins Rash      Medication List       Accurate as of December 29, 2019  3:41 PM. If you have any questions, ask your nurse or doctor.        STOP taking these medications   buPROPion 100 MG 12 hr tablet Commonly known as: WELLBUTRIN SR Stopped by: Hollice Espy, MD   calcipotriene 0.005 % cream Commonly known as: DOVONOX Stopped by: Hollice Espy, MD   fluorouracil 5 % cream Commonly known as: EFUDEX Stopped by: Hollice Espy, MD   losartan 50 MG tablet Commonly known as: COZAAR Stopped by: Hollice Espy, MD     TAKE these medications   amLODipine 5 MG tablet Commonly known as: NORVASC Take 5 mg by mouth daily.   latanoprost 0.005 % ophthalmic solution Commonly known as: XALATAN   losartan-hydrochlorothiazide 50-12.5 MG tablet Commonly known as: HYZAAR Take by mouth.   sildenafil 20 MG tablet Commonly known as: REVATIO Take 20 mg by mouth 3 (three) times daily as needed.   tamsulosin 0.4 MG Caps capsule Commonly known as: FLOMAX Take 1 capsule (0.4 mg total) by mouth daily.   timolol 0.5 % ophthalmic solution Commonly known as: TIMOPTIC instill 1 drop into left eye every morning   venlafaxine XR 150 MG 24 hr capsule Commonly known as: EFFEXOR-XR       Allergies:  Allergies  Allergen Reactions  . Alphagan P [Brimonidine Tartrate] Swelling    Eye redness, swelling, and discharge  . Codeine     constipation  . Shellfish Allergy Diarrhea and Nausea And Vomiting    Raw clams  . Penicillin V Potassium  Rash  . Penicillins Rash    Family History: Family History  Problem Relation Age of Onset  . CVA Mother   . Diabetes Father   . Prostate cancer Neg Hx   . Bladder Cancer Neg Hx   . Kidney cancer Neg Hx     Social History:  reports that he has never smoked. He has never used smokeless tobacco. He reports current alcohol use of about 21.0 standard drinks of alcohol per week. He reports that he does not use drugs.  Physical Exam: BP (!) 171/100   Pulse 87   Ht 5\' 6"  (1.676 m)   Wt 197 lb (89.4 kg)   BMI 31.80 kg/m   Constitutional:  Alert and oriented, No acute distress. HEENT: [place] AT, moist mucus membranes.  Trachea midline, no masses. Cardiovascular: No clubbing, cyanosis, or edema. Respiratory: Normal respiratory effort, no increased work of breathing. GI: Abdomen is soft, nontender, nondistended, no abdominal masses Rectal: Normal sphincter tone.  No nodules.  30 cc prostate. Skin: No rashes, bruises or suspicious lesions. Neurologic: Grossly intact, no focal deficits, moving all 4 extremities. Psychiatric: Normal mood and affect.  Laboratory Data: Lab Results  Component Value Date   PLT 241 04/20/2014    Lab Results  Component Value Date   CREATININE 1.10 11/09/2015   Results for orders placed or performed in visit on 12/29/19  BLADDER SCAN AMB NON-IMAGING  Result Value Ref Range   Scan Result 46ml     Assessment & Plan:    1. Enlarged prostate with urinary obstruction Currently moderately well managed on Flomax  Again is on previous occasions, we discussed alternatives to pharmacotherapy.  This would include some form of outlet procedure either UroLift versus TURP versus holep, etc.  We discussed possible advantages to this including avoidance of side effects of medication as well as bladder preservation/bladder health.  He may be interested in a procedure such as UroLift.  We discussed the role of cystoscopy/TRUS to discuss whether he is a candidate for  this.  He is agreeable this plan. - BLADDER SCAN AMB NON-IMAGING  2. Elevated PSA PSA downward trending which is reassuring  We will continue to trend annually with rectal exam given his history  3. Combined arterial insufficiency and corporo-venous occlusive erectile dysfunction Continue sildenafil as needed    Hollice Espy, MD  [facility] [address], [place], [place] [address] [phone]

## 2020-02-01 ENCOUNTER — Encounter: Payer: Self-pay | Admitting: Urology

## 2020-02-01 ENCOUNTER — Other Ambulatory Visit: Payer: Self-pay

## 2020-02-01 ENCOUNTER — Ambulatory Visit (INDEPENDENT_AMBULATORY_CARE_PROVIDER_SITE_OTHER): Payer: Medicare Other | Admitting: Urology

## 2020-02-01 VITALS — BP 169/100 | HR 60 | Wt 197.0 lb

## 2020-02-01 DIAGNOSIS — N401 Enlarged prostate with lower urinary tract symptoms: Secondary | ICD-10-CM

## 2020-02-01 DIAGNOSIS — N138 Other obstructive and reflux uropathy: Secondary | ICD-10-CM | POA: Diagnosis not present

## 2020-02-01 NOTE — Progress Notes (Signed)
02/01/20  Chief Complaint  Patient presents with  . Cysto/TRUS     HPI: 70 year old male with refractory urinary symptoms related to BPH who presents today to the office for cystoscopy.  Please see previous notes for details.  Previous prostate size is relatively small at the time of prostate biopsy, 19 g .   Please see previous notes for details.     There were no vitals taken for this visit. NED. A&Ox3.   No respiratory distress   Abd soft, NT, ND Normal phallus with bilateral descended testicles    Cystoscopy Procedure Note  Patient identification was confirmed, informed consent was obtained, and patient was prepped using Betadine solution.  Lidocaine jelly was administered per urethral meatus.    Preoperative abx where received prior to procedure.     Pre-Procedure: - Inspection reveals a normal caliber ureteral meatus.  Procedure: The flexible cystoscope was introduced without difficulty - No urethral strictures/lesions are present. - Enlarged prostate bilobar coaptation - Mildly elevated bladder neck - Bilateral ureteral orifices identified - Bladder mucosa  reveals no ulcers, tumors, or lesions - No bladder stones -Minimal trabeculation  Retroflexion shows no discrete median lobe   Post-Procedure: - Patient tolerated the procedure well   Assessment/ Plan:  1. Enlarged prostate with urinary obstruction Longstanding obstructive urinary symptoms currently on Flomax  He is now interested in outlet procedure.  Based on the relatively small prostate volume but has an obstructing appearance, he be an excellent candidate for UroLift versus TURP.  We discussed the risk and benefits of each.  He is most interested in UroLift.  We discussed the preoperative, intraoperative and postoperative course.  We discussed risk including bleeding, infection, damage to any structures, clip migration, calcification amongst others.  All questions were answered.  He would like to be  booked for UroLift  - Urinalysis, Complete - CULTURE, URINE COMPREHENSIVE    Vanna Scotland, MD

## 2020-02-02 LAB — URINALYSIS, COMPLETE
Bilirubin, UA: NEGATIVE
Glucose, UA: NEGATIVE
Ketones, UA: NEGATIVE
Nitrite, UA: NEGATIVE
Protein,UA: NEGATIVE
Specific Gravity, UA: 1.025 (ref 1.005–1.030)
Urobilinogen, Ur: 0.2 mg/dL (ref 0.2–1.0)
pH, UA: 5.5 (ref 5.0–7.5)

## 2020-02-02 LAB — MICROSCOPIC EXAMINATION: Epithelial Cells (non renal): NONE SEEN /hpf (ref 0–10)

## 2020-02-05 LAB — CULTURE, URINE COMPREHENSIVE

## 2020-02-15 ENCOUNTER — Other Ambulatory Visit: Payer: Self-pay | Admitting: Radiology

## 2020-02-15 DIAGNOSIS — N138 Other obstructive and reflux uropathy: Secondary | ICD-10-CM

## 2020-02-18 ENCOUNTER — Other Ambulatory Visit: Payer: Self-pay | Admitting: Urology

## 2020-03-07 ENCOUNTER — Ambulatory Visit: Payer: Medicare Other | Admitting: Urology

## 2020-03-07 ENCOUNTER — Other Ambulatory Visit: Payer: Self-pay

## 2020-03-07 VITALS — BP 143/90 | HR 67 | Ht 66.0 in | Wt 190.0 lb

## 2020-03-07 DIAGNOSIS — R972 Elevated prostate specific antigen [PSA]: Secondary | ICD-10-CM | POA: Diagnosis not present

## 2020-03-07 DIAGNOSIS — N138 Other obstructive and reflux uropathy: Secondary | ICD-10-CM | POA: Diagnosis not present

## 2020-03-07 DIAGNOSIS — N401 Enlarged prostate with lower urinary tract symptoms: Secondary | ICD-10-CM

## 2020-03-07 NOTE — Progress Notes (Signed)
03/07/2020 4:59 PM   [name] 15-Jan-1951 [id number]  Referring provider: Juluis Pitch, MD [phone] [place],  [place] [address]  Chief Complaint  Patient presents with  . Elevated PSA    HPI: 70 year old male with obstructive urinary symptoms on Flomax we have been considering UroLift.  He underwent anatomic evaluation which showed a relatively small prostate without a discrete median lobe.  He initially booked the procedure and then had several questions or concerns which he like to discuss today.  Overall, he reports that his symptoms are stable on Flomax but if he forgets to take the medication abstains from it for a few days like during his orthopedic surgery, he had severe urinary problems.  He also has a percentage of elevated PSA which is normalized more recently.   PMH: Past Medical History:  Diagnosis Date  . Actinic keratosis   . Anxiety   . Benign non-nodular prostatic hyperplasia with lower urinary tract symptoms   . Cancer of sigmoid colon ([place]) 06/20/2014  . Depression 11/24/2013   Overview:  Questionable bipolar  . Diverticulosis   . Fatty liver    on CT  . Hemorrhoids   . History of chicken pox   . Hx of basal cell carcinoma 12/24/2016   Left anterior ear helix  . Hx of basal cell carcinoma 06/24/2017   Left upper forehead  . Hx of basal cell carcinoma 08/14/2017   Right upper forehead  . Hypertension 11/24/2013  . Personal history of colonic polyps 02/24/2014    Surgical History: Past Surgical History:  Procedure Laterality Date  . CATARACT EXTRACTION W/ INTRAOCULAR LENS IMPLANT Left 2011  . CATARACT EXTRACTION W/PHACO Right 05/26/2019   Procedure: CATARACT EXTRACTION PHACO AND INTRAOCULAR LENS PLACEMENT (IOC) RIGHT 10.64  01:10.2  15.1%;  Surgeon: Leandrew Koyanagi, MD;  Location: [facility];  Service: Ophthalmology;  Laterality: Right;  . COLON SURGERY    . COLONOSCOPY WITH PROPOFOL N/A 07/22/2017   Procedure: COLONOSCOPY  WITH PROPOFOL;  Surgeon: Lollie Sails, MD;  Location: [facility] ENDOSCOPY;  Service: Endoscopy;  Laterality: N/A;  . COLONOSCOPY WITH PROPOFOL N/A 07/21/2019   Procedure: COLONOSCOPY WITH PROPOFOL;  Surgeon: Toledo, Benay Pike, MD;  Location: [facility] ENDOSCOPY;  Service: Gastroenterology;  Laterality: N/A;  . DIAGNOSTIC LAPAROSCOPY    . ESOPHAGOGASTRODUODENOSCOPY N/A 07/21/2019   Procedure: ESOPHAGOGASTRODUODENOSCOPY (EGD);  Surgeon: Toledo, Benay Pike, MD;  Location: [facility] ENDOSCOPY;  Service: Gastroenterology;  Laterality: N/A;  . EYE SURGERY     DETACHED RETINA  . LAPAROSCOPIC BOWEL RESECTION    . RETINAL DETACHMENT SURGERY    . TONSILLECTOMY    . WISDOM TOOTH EXTRACTION      Home Medications:  Allergies as of 03/07/2020      Reactions   Alphagan P [brimonidine Tartrate] Swelling   Eye redness, swelling, and discharge   Codeine    constipation   Shellfish Allergy Diarrhea, Nausea And Vomiting   Raw clams   Penicillin V Potassium Rash   Penicillins Rash      Medication List       Accurate as of March 07, 2020  4:59 PM. If you have any questions, ask your nurse or doctor.        amLODipine 5 MG tablet Commonly known as: NORVASC Take 5 mg by mouth daily.   latanoprost 0.005 % ophthalmic solution Commonly known as: XALATAN   losartan-hydrochlorothiazide 50-12.5 MG tablet Commonly known as: HYZAAR Take by mouth.   sildenafil 20 MG tablet Commonly  known as: REVATIO Take 20 mg by mouth 3 (three) times daily as needed.   tamsulosin 0.4 MG Caps capsule Commonly known as: FLOMAX TAKE 1 CAPSULE(0.4 MG) BY MOUTH DAILY   timolol 0.5 % ophthalmic solution Commonly known as: TIMOPTIC instill 1 drop into left eye every morning   venlafaxine XR 150 MG 24 hr capsule Commonly known as: EFFEXOR-XR       Allergies:  Allergies  Allergen Reactions  . Alphagan P [Brimonidine Tartrate] Swelling    Eye redness, swelling, and discharge  . Codeine     constipation  . Shellfish  Allergy Diarrhea and Nausea And Vomiting    Raw clams  . Penicillin V Potassium Rash  . Penicillins Rash    Family History: Family History  Problem Relation Age of Onset  . CVA Mother   . Diabetes Father   . Prostate cancer Neg Hx   . Bladder Cancer Neg Hx   . Kidney cancer Neg Hx     Social History:  reports that he has never smoked. He has never used smokeless tobacco. He reports current alcohol use of about 21.0 standard drinks of alcohol per week. He reports that he does not use drugs.   Physical Exam: BP (!) 143/90   Pulse 67   Ht 5\' 6"  (1.676 m)   Wt 190 lb (86.2 kg)   BMI 30.67 kg/m   Constitutional:  Alert and oriented, No acute distress. HEENT: [place] AT, moist mucus membranes.  Trachea midline, no masses. Cardiovascular: No clubbing, cyanosis, or edema. Skin: No rashes, bruises or suspicious lesions. Neurologic: Grossly intact, no focal deficits, moving all 4 extremities. Psychiatric: Normal mood and affect.   Assessment & Plan:    1. Enlarged prostate with urinary obstruction Symptoms controlled on Flomax, had been considering UroLift but at this time after lengthy discussion today, he decided that he like to hold off for the time being but will consider it for the future  We will tentatively schedule to see him back in a year or sooner as needed if his symptoms recur  Continue flomax  - Urinalysis, Complete - CULTURE, URINE COMPREHENSIVE  2. Elevated PSA With recheck PSA and rectal exam next year    Hollice Espy, MD  [facility] [address] [place], [place] [address] [phone]  I spent 30 total minutes on the day of the encounter including pre-visit review of the medical record, face-to-face time with the patient, and post visit ordering of labs/imaging/tests.

## 2020-03-08 LAB — URINALYSIS, COMPLETE
Bilirubin, UA: NEGATIVE
Glucose, UA: NEGATIVE
Ketones, UA: NEGATIVE
Nitrite, UA: NEGATIVE
Protein,UA: NEGATIVE
Specific Gravity, UA: 1.02 (ref 1.005–1.030)
Urobilinogen, Ur: 0.2 mg/dL (ref 0.2–1.0)
pH, UA: 7 (ref 5.0–7.5)

## 2020-03-08 LAB — MICROSCOPIC EXAMINATION: Bacteria, UA: NONE SEEN

## 2020-03-10 ENCOUNTER — Other Ambulatory Visit: Payer: Medicare Other

## 2020-03-10 LAB — CULTURE, URINE COMPREHENSIVE

## 2020-03-13 ENCOUNTER — Other Ambulatory Visit: Payer: Medicare Other

## 2020-03-16 ENCOUNTER — Other Ambulatory Visit: Payer: Medicare Other

## 2020-03-17 ENCOUNTER — Telehealth: Payer: Self-pay | Admitting: *Deleted

## 2020-03-17 NOTE — Telephone Encounter (Addendum)
Patient returned call, aware. Voiced understanding.   ----- Message from Hollice Espy, MD sent at 03/13/2020  4:27 PM EST ----- Incidental staph in urine, low colony count suspect contamination.  Will not treat unless he becomes symptomatic.  Hollice Espy, MD

## 2020-03-20 ENCOUNTER — Ambulatory Visit: Admit: 2020-03-20 | Payer: Medicare Other | Admitting: Urology

## 2020-03-20 SURGERY — CYSTOSCOPY WITH INSERTION OF UROLIFT
Anesthesia: Monitor Anesthesia Care

## 2020-04-19 ENCOUNTER — Ambulatory Visit: Payer: Medicare Other | Admitting: Urology

## 2020-04-21 ENCOUNTER — Emergency Department: Payer: Medicare Other

## 2020-04-21 ENCOUNTER — Other Ambulatory Visit: Payer: Self-pay

## 2020-04-21 ENCOUNTER — Observation Stay
Admission: EM | Admit: 2020-04-21 | Discharge: 2020-04-23 | Disposition: A | Discharge status: Home / Self Care | Payer: Medicare Other | Attending: Internal Medicine | Admitting: Internal Medicine

## 2020-04-21 DIAGNOSIS — Z85038 Personal history of other malignant neoplasm of large intestine: Secondary | ICD-10-CM | POA: Insufficient documentation

## 2020-04-21 DIAGNOSIS — R55 Syncope and collapse: Secondary | ICD-10-CM | POA: Diagnosis not present

## 2020-04-21 DIAGNOSIS — Z20822 Contact with and (suspected) exposure to covid-19: Secondary | ICD-10-CM | POA: Insufficient documentation

## 2020-04-21 DIAGNOSIS — I10 Essential (primary) hypertension: Secondary | ICD-10-CM | POA: Insufficient documentation

## 2020-04-21 DIAGNOSIS — Z79899 Other long term (current) drug therapy: Secondary | ICD-10-CM | POA: Insufficient documentation

## 2020-04-21 DIAGNOSIS — Z85828 Personal history of other malignant neoplasm of skin: Secondary | ICD-10-CM | POA: Diagnosis not present

## 2020-04-21 LAB — CBC WITH DIFFERENTIAL/PLATELET
Abs Immature Granulocytes: 0.02 10*3/uL (ref 0.00–0.07)
Basophils Absolute: 0 10*3/uL (ref 0.0–0.1)
Basophils Relative: 0 %
Eosinophils Absolute: 0.4 10*3/uL (ref 0.0–0.5)
Eosinophils Relative: 5 %
HCT: 43.8 % (ref 39.0–52.0)
Hemoglobin: 15.7 g/dL (ref 13.0–17.0)
Immature Granulocytes: 0 %
Lymphocytes Relative: 30 %
Lymphs Abs: 2.5 10*3/uL (ref 0.7–4.0)
MCH: 33.9 pg (ref 26.0–34.0)
MCHC: 35.8 g/dL (ref 30.0–36.0)
MCV: 94.6 fL (ref 80.0–100.0)
Monocytes Absolute: 1.1 10*3/uL — ABNORMAL HIGH (ref 0.1–1.0)
Monocytes Relative: 14 %
Neutro Abs: 4.3 10*3/uL (ref 1.7–7.7)
Neutrophils Relative %: 51 %
Platelets: 175 10*3/uL (ref 150–400)
RBC: 4.63 MIL/uL (ref 4.22–5.81)
RDW: 12.7 % (ref 11.5–15.5)
WBC: 8.4 10*3/uL (ref 4.0–10.5)
nRBC: 0 % (ref 0.0–0.2)

## 2020-04-21 LAB — COMPREHENSIVE METABOLIC PANEL
ALT: 52 U/L — ABNORMAL HIGH (ref 0–44)
AST: 55 U/L — ABNORMAL HIGH (ref 15–41)
Albumin: 3.6 g/dL (ref 3.5–5.0)
Alkaline Phosphatase: 86 U/L (ref 38–126)
Anion gap: 10 (ref 5–15)
BUN: 15 mg/dL (ref 8–23)
CO2: 23 mmol/L (ref 22–32)
Calcium: 8.6 mg/dL — ABNORMAL LOW (ref 8.9–10.3)
Chloride: 99 mmol/L (ref 98–111)
Creatinine, Ser: 0.84 mg/dL (ref 0.61–1.24)
GFR, Estimated: >60 mL/min (ref >60)
Glucose, Bld: 118 mg/dL — ABNORMAL HIGH (ref 70–99)
Potassium: 3.4 mmol/L — ABNORMAL LOW (ref 3.5–5.1)
Sodium: 132 mmol/L — ABNORMAL LOW (ref 135–145)
Total Bilirubin: 0.9 mg/dL (ref 0.3–1.2)
Total Protein: 6.3 g/dL — ABNORMAL LOW (ref 6.5–8.1)

## 2020-04-21 LAB — ETHANOL: Alcohol, Ethyl (B): 110 mg/dL — ABNORMAL HIGH (ref <10)

## 2020-04-21 LAB — TROPONIN I (HIGH SENSITIVITY): Troponin I (High Sensitivity): 10 ng/L (ref <18)

## 2020-04-21 NOTE — ED Notes (Signed)
ED Provider at bedside. 

## 2020-04-21 NOTE — ED Triage Notes (Addendum)
Pt is a 70 y/o m from home who presents via EMS with a cc of syncope. Pt was sitting and fell out of a chair landing on his face. Pt reports mild pain to right hand. Denies other injury. Small lac above upper lip. Drank approx 3 beers tonight (drinks 75oz per night). Pt reports this is the second syncopal episode this week. Hx of HTN. Alert and oriented x4 upon arrival. No associated SOB, chest pain, dizziness, n/v/d.

## 2020-04-21 NOTE — ED Notes (Signed)
X-ray at bedside

## 2020-04-22 ENCOUNTER — Encounter: Payer: Self-pay | Admitting: Family Medicine

## 2020-04-22 ENCOUNTER — Observation Stay: Payer: Medicare Other

## 2020-04-22 DIAGNOSIS — F10920 Alcohol use, unspecified with intoxication, uncomplicated: Secondary | ICD-10-CM

## 2020-04-22 DIAGNOSIS — R55 Syncope and collapse: Secondary | ICD-10-CM | POA: Diagnosis not present

## 2020-04-22 DIAGNOSIS — I10 Essential (primary) hypertension: Secondary | ICD-10-CM

## 2020-04-22 DIAGNOSIS — F1010 Alcohol abuse, uncomplicated: Secondary | ICD-10-CM | POA: Diagnosis not present

## 2020-04-22 DIAGNOSIS — E876 Hypokalemia: Secondary | ICD-10-CM

## 2020-04-22 DIAGNOSIS — E871 Hypo-osmolality and hyponatremia: Secondary | ICD-10-CM

## 2020-04-22 LAB — COMPREHENSIVE METABOLIC PANEL
ALT: 52 U/L — ABNORMAL HIGH (ref 0–44)
AST: 59 U/L — ABNORMAL HIGH (ref 15–41)
Albumin: 3.9 g/dL (ref 3.5–5.0)
Alkaline Phosphatase: 100 U/L (ref 38–126)
Anion gap: 8 (ref 5–15)
BUN: 13 mg/dL (ref 8–23)
CO2: 26 mmol/L (ref 22–32)
Calcium: 8.9 mg/dL (ref 8.9–10.3)
Chloride: 103 mmol/L (ref 98–111)
Creatinine, Ser: 0.84 mg/dL (ref 0.61–1.24)
GFR, Estimated: >60 mL/min (ref >60)
Glucose, Bld: 138 mg/dL — ABNORMAL HIGH (ref 70–99)
Potassium: 4.8 mmol/L (ref 3.5–5.1)
Sodium: 137 mmol/L (ref 135–145)
Total Bilirubin: 0.6 mg/dL (ref 0.3–1.2)
Total Protein: 6.9 g/dL (ref 6.5–8.1)

## 2020-04-22 LAB — AMMONIA: Ammonia: 32 umol/L (ref 9–35)

## 2020-04-22 LAB — URINE DRUG SCREEN, QUALITATIVE (ARMC ONLY)
Amphetamines, Ur Screen: NOT DETECTED
Barbiturates, Ur Screen: NOT DETECTED
Benzodiazepine, Ur Scrn: NOT DETECTED
Cannabinoid 50 Ng, Ur ~~LOC~~: NOT DETECTED
Cocaine Metabolite,Ur ~~LOC~~: NOT DETECTED
MDMA (Ecstasy)Ur Screen: NOT DETECTED
Methadone Scn, Ur: NOT DETECTED
Opiate, Ur Screen: NOT DETECTED
Phencyclidine (PCP) Ur S: NOT DETECTED
Tricyclic, Ur Screen: NOT DETECTED

## 2020-04-22 LAB — CBC
HCT: 45.5 % (ref 39.0–52.0)
Hemoglobin: 16.4 g/dL (ref 13.0–17.0)
MCH: 34.2 pg — ABNORMAL HIGH (ref 26.0–34.0)
MCHC: 36 g/dL (ref 30.0–36.0)
MCV: 94.8 fL (ref 80.0–100.0)
Platelets: 170 10*3/uL (ref 150–400)
RBC: 4.8 MIL/uL (ref 4.22–5.81)
RDW: 12.6 % (ref 11.5–15.5)
WBC: 9.5 10*3/uL (ref 4.0–10.5)
nRBC: 0 % (ref 0.0–0.2)

## 2020-04-22 LAB — RESP PANEL BY RT-PCR (FLU A&B, COVID) ARPGX2
Influenza A by PCR: NEGATIVE
Influenza B by PCR: NEGATIVE
SARS Coronavirus 2 by RT PCR: NEGATIVE

## 2020-04-22 LAB — MAGNESIUM: Magnesium: 2.1 mg/dL (ref 1.7–2.4)

## 2020-04-22 LAB — HIV ANTIBODY (ROUTINE TESTING W REFLEX): HIV Screen 4th Generation wRfx: NONREACTIVE

## 2020-04-22 LAB — PROTIME-INR
INR: 1.1 (ref 0.8–1.2)
Prothrombin Time: 13.7 seconds (ref 11.4–15.2)

## 2020-04-22 LAB — TROPONIN I (HIGH SENSITIVITY): Troponin I (High Sensitivity): 12 ng/L (ref <18)

## 2020-04-22 MED ORDER — SODIUM CHLORIDE 0.9 % IV SOLN: INTRAVENOUS | Status: DC

## 2020-04-22 MED ORDER — ONDANSETRON HCL 4 MG PO TABS: 4.0000 mg | ORAL_TABLET | Freq: Four times a day (QID) | ORAL | Status: DC | PRN

## 2020-04-22 MED ORDER — POTASSIUM CHLORIDE 20 MEQ PO PACK
20.0000 meq | PACK | Freq: Once | ORAL | Status: AC
  Administered 2020-04-22: 03:00:00 20 meq via ORAL
  Filled 2020-04-22: qty 1

## 2020-04-22 MED ORDER — VENLAFAXINE HCL ER 75 MG PO CP24
150.0000 mg | ORAL_CAPSULE | Freq: Every day | ORAL | Status: DC
  Filled 2020-04-22: qty 1

## 2020-04-22 MED ORDER — ACETAMINOPHEN 650 MG RE SUPP: 650.0000 mg | Freq: Four times a day (QID) | RECTAL | Status: DC | PRN

## 2020-04-22 MED ORDER — LOSARTAN POTASSIUM-HCTZ 50-12.5 MG PO TABS: 1.0000 | ORAL_TABLET | Freq: Every day | ORAL | Status: DC

## 2020-04-22 MED ORDER — AMLODIPINE BESYLATE 5 MG PO TABS
5.0000 mg | ORAL_TABLET | Freq: Every day | ORAL | Status: DC
  Administered 2020-04-22 – 2020-04-23 (×2): 5 mg via ORAL
  Filled 2020-04-22 (×2): qty 1

## 2020-04-22 MED ORDER — VENLAFAXINE HCL ER 75 MG PO CP24
150.0000 mg | ORAL_CAPSULE | Freq: Every day | ORAL | Status: DC
  Administered 2020-04-22 (×2): 150 mg via ORAL
  Filled 2020-04-22: qty 2

## 2020-04-22 MED ORDER — ONDANSETRON HCL 4 MG/2ML IJ SOLN: 4.0000 mg | Freq: Four times a day (QID) | INTRAMUSCULAR | Status: DC | PRN

## 2020-04-22 MED ORDER — TIMOLOL MALEATE 0.5 % OP SOLN
1.0000 [drp] | Freq: Every day | OPHTHALMIC | Status: DC
  Administered 2020-04-22 – 2020-04-23 (×2): 1 [drp] via OPHTHALMIC
  Filled 2020-04-22: qty 5

## 2020-04-22 MED ORDER — SODIUM CHLORIDE 0.9% FLUSH
3.0000 mL | Freq: Two times a day (BID) | INTRAVENOUS | Status: DC
  Administered 2020-04-22 (×3): 3 mL via INTRAVENOUS

## 2020-04-22 MED ORDER — LATANOPROST 0.005 % OP SOLN
1.0000 [drp] | Freq: Every day | OPHTHALMIC | Status: DC
  Administered 2020-04-22: 22:00:00 1 [drp] via OPHTHALMIC
  Filled 2020-04-22: qty 2.5

## 2020-04-22 MED ORDER — ENOXAPARIN SODIUM 60 MG/0.6ML ~~LOC~~ SOLN
0.5000 mg/kg | SUBCUTANEOUS | Status: DC
  Administered 2020-04-22: 47.5 mg via SUBCUTANEOUS
  Filled 2020-04-22: qty 0.6

## 2020-04-22 MED ORDER — TRAZODONE HCL 50 MG PO TABS
25.0000 mg | ORAL_TABLET | Freq: Every evening | ORAL | Status: DC | PRN
Start: 2020-04-22 — End: 2020-04-23
  Administered 2020-04-22 – 2020-04-23 (×2): 25 mg via ORAL
  Filled 2020-04-22 (×2): qty 1

## 2020-04-22 MED ORDER — MAGNESIUM HYDROXIDE 400 MG/5ML PO SUSP
30.0000 mL | Freq: Every day | ORAL | Status: DC | PRN
  Filled 2020-04-22: qty 30

## 2020-04-22 MED ORDER — TAMSULOSIN HCL 0.4 MG PO CAPS
0.4000 mg | ORAL_CAPSULE | Freq: Every day | ORAL | Status: DC
  Administered 2020-04-22 – 2020-04-23 (×2): 0.4 mg via ORAL
  Filled 2020-04-22 (×2): qty 1

## 2020-04-22 MED ORDER — HYDROCHLOROTHIAZIDE 12.5 MG PO CAPS
12.5000 mg | ORAL_CAPSULE | Freq: Every day | ORAL | Status: DC
  Administered 2020-04-22 – 2020-04-23 (×2): 12.5 mg via ORAL
  Filled 2020-04-22 (×2): qty 1

## 2020-04-22 MED ORDER — ALUM & MAG HYDROXIDE-SIMETH 200-200-20 MG/5ML PO SUSP: 30.0000 mL | Freq: Four times a day (QID) | ORAL | Status: DC | PRN

## 2020-04-22 MED ORDER — LOSARTAN POTASSIUM 50 MG PO TABS
50.0000 mg | ORAL_TABLET | Freq: Every day | ORAL | Status: DC
  Administered 2020-04-22 – 2020-04-23 (×2): 50 mg via ORAL
  Filled 2020-04-22 (×2): qty 1

## 2020-04-22 MED ORDER — ACETAMINOPHEN 325 MG PO TABS
650.0000 mg | ORAL_TABLET | Freq: Four times a day (QID) | ORAL | Status: DC | PRN
  Administered 2020-04-22: 650 mg via ORAL
  Filled 2020-04-22: qty 2

## 2020-04-22 MED ORDER — POTASSIUM CHLORIDE 20 MEQ PO PACK
40.0000 meq | PACK | Freq: Once | ORAL | Status: AC
  Administered 2020-04-22: 02:00:00 40 meq via ORAL
  Filled 2020-04-22: qty 2

## 2020-04-22 NOTE — H&P (Signed)
[place]   PATIENT NAME: Matthew Webster    MR#:  [id number]  DATE OF BIRTH:  1950-11-29  DATE OF ADMISSION:  04/21/2020  PRIMARY CARE PHYSICIAN: Juluis Pitch, MD   Patient is coming from: Home  REQUESTING/REFERRING PHYSICIAN: Rudene Re, MD CHIEF COMPLAINT:   Chief Complaint  Patient presents with  . Loss of Consciousness    HISTORY OF PRESENT ILLNESS:  Matthew Webster is a 70 y.o. Caucasian male with medical history significant for essential hypertension, BPH, depression and mild alcohol abuse and fatty liver, who presented to the emergency room with acute onset of syncope which happened today while he was sitting and watching TV.  He stated that he was lucid and he found himself on the floor and had a subsequent upper lip laceration.  He denied any palpitations or chest pain, paresthesias or focal muscle weakness.  No fever or chills, no cough or wheezing or dyspnea or chest pain or palpitations.  No nausea or vomiting or abdominal pain.  No dysuria, oliguria or hematuria or flank pain.  He usually drinks 3 to 4 25 ounce beers and has been doing so for the last 50 years.  He stated that he had a similar syncopal episode on Sunday or Monday when he was opening the door for his dog to let him out.  He also admitted to drinking 3 beers each 25 ounce before each incident of syncope.  ED Course: Upon presentation to the ER, vital signs were within normal.  Labs revealed hyperkalemia of 3.4 and hyponatremia 132 with an AST of 55 and ALT 52 and ammonia level of 32.  CBC was within normal.  INR was 1.1 and PT 13.7.  Respiratory panel is pending.  Alcohol level was 110.  EKG as reviewed by me : Showed normal sinus rhythm with a rate of 70 and Q waves in V1 with low voltage QRS. Imaging: Head and neck CT scan revealed no acute intracranial normalities and mild mucosal thickening of the paranasal sinuses with multilevel degenerative C-spine changes without acute  abnormality.  The patient will be admitted to an observation medical monitored bed for further evaluation and management.  PAST MEDICAL HISTORY:   Past Medical History:  Diagnosis Date  . Actinic keratosis   . Anxiety   . Benign non-nodular prostatic hyperplasia with lower urinary tract symptoms   . Cancer of sigmoid colon ([place]) 06/20/2014  . Depression 11/24/2013   Overview:  Questionable bipolar  . Diverticulosis   . Fatty liver    on CT  . Hemorrhoids   . History of chicken pox   . Hx of basal cell carcinoma 12/24/2016   Left anterior ear helix  . Hx of basal cell carcinoma 06/24/2017   Left upper forehead  . Hx of basal cell carcinoma 08/14/2017   Right upper forehead  . Hypertension 11/24/2013  . Personal history of colonic polyps 02/24/2014    PAST SURGICAL HISTORY:   Past Surgical History:  Procedure Laterality Date  . CATARACT EXTRACTION W/ INTRAOCULAR LENS IMPLANT Left 2011  . CATARACT EXTRACTION W/PHACO Right 05/26/2019   Procedure: CATARACT EXTRACTION PHACO AND INTRAOCULAR LENS PLACEMENT (IOC) RIGHT 10.64  01:10.2  15.1%;  Surgeon: Leandrew Koyanagi, MD;  Location: [facility];  Service: Ophthalmology;  Laterality: Right;  . COLON SURGERY    . COLONOSCOPY WITH PROPOFOL N/A 07/22/2017   Procedure: COLONOSCOPY WITH PROPOFOL;  Surgeon: Lollie Sails, MD;  Location: [facility] ENDOSCOPY;  Service: Endoscopy;  Laterality: N/A;  . COLONOSCOPY WITH PROPOFOL N/A 07/21/2019   Procedure: COLONOSCOPY WITH PROPOFOL;  Surgeon: Toledo, Benay Pike, MD;  Location: [facility] ENDOSCOPY;  Service: Gastroenterology;  Laterality: N/A;  . DIAGNOSTIC LAPAROSCOPY    . ESOPHAGOGASTRODUODENOSCOPY N/A 07/21/2019   Procedure: ESOPHAGOGASTRODUODENOSCOPY (EGD);  Surgeon: Toledo, Benay Pike, MD;  Location: [facility] ENDOSCOPY;  Service: Gastroenterology;  Laterality: N/A;  . EYE SURGERY     DETACHED RETINA  . LAPAROSCOPIC BOWEL RESECTION    . RETINAL DETACHMENT SURGERY    . TONSILLECTOMY    .  WISDOM TOOTH EXTRACTION      SOCIAL HISTORY:   Social History   Tobacco Use  . Smoking status: Never Smoker  . Smokeless tobacco: Never Used  Substance Use Topics  . Alcohol use: Yes    Alcohol/week: 21.0 standard drinks    Types: 21 Cans of beer per week    Comment: 3 25 oz beers per night    FAMILY HISTORY:   Family History  Problem Relation Age of Onset  . CVA Mother   . Diabetes Father   . Prostate cancer Neg Hx   . Bladder Cancer Neg Hx   . Kidney cancer Neg Hx     DRUG ALLERGIES:   Allergies  Allergen Reactions  . Alphagan P [Brimonidine Tartrate] Swelling    Eye redness, swelling, and discharge  . Codeine     constipation  . Shellfish Allergy Diarrhea and Nausea And Vomiting    Raw clams  . Penicillin V Potassium Rash  . Penicillins Rash    REVIEW OF SYSTEMS:   ROS As per history of present illness. All pertinent systems were reviewed above. Constitutional, HEENT, cardiovascular, respiratory, GI, GU, musculoskeletal, neuro, psychiatric, endocrine, integumentary and hematologic systems were reviewed and are otherwise negative/unremarkable except for positive findings mentioned above in the HPI.   MEDICATIONS AT HOME:   Prior to Admission medications   Medication Sig Start Date End Date Taking? Authorizing Provider  amLODipine (NORVASC) 5 MG tablet Take 5 mg by mouth daily.    [provider]  latanoprost (XALATAN) 0.005 % ophthalmic solution  08/16/15   [provider]  losartan-hydrochlorothiazide (HYZAAR) 50-12.5 MG tablet Take by mouth. 06/13/15   [provider]  sildenafil (REVATIO) 20 MG tablet Take 20 mg by mouth 3 (three) times daily as needed.    [provider]  tamsulosin (FLOMAX) 0.4 MG CAPS capsule TAKE 1 CAPSULE(0.4 MG) BY MOUTH DAILY 02/18/20   Hollice Espy, MD  timolol (TIMOPTIC) 0.5 % ophthalmic solution instill 1 drop into left eye every morning 08/17/15   [provider]  venlafaxine XR  (EFFEXOR-XR) 150 MG 24 hr capsule  10/01/15   [provider]      VITAL SIGNS:  Blood pressure 137/85, pulse 67, temperature 98.3 F (36.8 C), temperature source Oral, resp. rate 18, height 5\' 6"  (1.676 m), weight 93.5 kg, SpO2 94 %.  PHYSICAL EXAMINATION:  Physical Exam  GENERAL:  70 y.o.-year-old Caucasian male patient lying in the bed with no acute distress.  EYES: Pupils equal, round, reactive to light and accommodation. No scleral icterus. Extraocular muscles intact.  HEENT: Head atraumatic, normocephalic. Oropharynx and nasopharynx clear.  NECK:  Supple, no jugular venous distention. No thyroid enlargement, no tenderness.  LUNGS: Normal breath sounds bilaterally, no wheezing, rales,rhonchi or crepitation. No use of accessory muscles of respiration.  CARDIOVASCULAR: Regular rate and rhythm, S1, S2 normal. No murmurs, rubs, or gallops.  ABDOMEN: Soft, nondistended, nontender. Bowel sounds present.  No organomegaly or mass.  EXTREMITIES: No pedal edema, cyanosis, or clubbing.  NEUROLOGIC: Cranial nerves II through XII are intact. Muscle strength 5/5 in all extremities. Sensation intact. Gait not checked.  PSYCHIATRIC: The patient is alert and oriented x 3.  Normal affect and good eye contact. SKIN: No obvious rash, lesion, or ulcer.   LABORATORY PANEL:   CBC Recent Labs  Lab 04/21/20 2251  WBC 8.4  HGB 15.7  HCT 43.8  PLT 175   ------------------------------------------------------------------------------------------------------------------  Chemistries  Recent Labs  Lab 04/21/20 2251  NA 132*  K 3.4*  CL 99  CO2 23  GLUCOSE 118*  BUN 15  CREATININE 0.84  CALCIUM 8.6*  AST 55*  ALT 52*  ALKPHOS 86  BILITOT 0.9   ------------------------------------------------------------------------------------------------------------------  Cardiac Enzymes No results for input(s): TROPONINI in the last 168  hours. ------------------------------------------------------------------------------------------------------------------  RADIOLOGY:  CT Head Wo Contrast  Result Date: 04/21/2020 CLINICAL DATA:  Recent syncopal episode EXAM: CT HEAD WITHOUT CONTRAST CT CERVICAL SPINE WITHOUT CONTRAST TECHNIQUE: Multidetector CT imaging of the head and cervical spine was performed following the standard protocol without intravenous contrast. Multiplanar CT image reconstructions of the cervical spine were also generated. COMPARISON:  None. FINDINGS: CT HEAD FINDINGS Brain: No evidence of acute infarction, hemorrhage, hydrocephalus, extra-axial collection or mass lesion/mass effect. Vascular: No hyperdense vessel or unexpected calcification. Skull: Normal. Negative for fracture or focal lesion. Sinuses/Orbits: Mild mucosal thickening is noted within the ethmoid and maxillary sinuses. Other: None. CT CERVICAL SPINE FINDINGS Alignment: Within normal limits. Skull base and vertebrae: 7 cervical segments are well visualized. Vertebral body height is well maintained. Osteophytic changes are noted throughout the cervical spine. Multilevel facet hypertrophic changes are noted as well. No acute fracture or acute facet abnormality is noted. Multilevel neural foraminal narrowing is seen. Soft tissues and spinal canal: Surrounding soft tissue structures show no acute abnormality. Upper chest: Visualized lung apices are unremarkable. Other: None IMPRESSION: CT of the head: No acute intracranial abnormality noted. Mild mucosal thickening in the paranasal sinuses. CT of the cervical spine: Multilevel degenerative changes without acute abnormality. Electronically Signed   By: Inez Catalina M.D.   On: 04/21/2020 23:57   CT Cervical Spine Wo Contrast  Result Date: 04/21/2020 CLINICAL DATA:  Recent syncopal episode EXAM: CT HEAD WITHOUT CONTRAST CT CERVICAL SPINE WITHOUT CONTRAST TECHNIQUE: Multidetector CT imaging of the head and cervical  spine was performed following the standard protocol without intravenous contrast. Multiplanar CT image reconstructions of the cervical spine were also generated. COMPARISON:  None. FINDINGS: CT HEAD FINDINGS Brain: No evidence of acute infarction, hemorrhage, hydrocephalus, extra-axial collection or mass lesion/mass effect. Vascular: No hyperdense vessel or unexpected calcification. Skull: Normal. Negative for fracture or focal lesion. Sinuses/Orbits: Mild mucosal thickening is noted within the ethmoid and maxillary sinuses. Other: None. CT CERVICAL SPINE FINDINGS Alignment: Within normal limits. Skull base and vertebrae: 7 cervical segments are well visualized. Vertebral body height is well maintained. Osteophytic changes are noted throughout the cervical spine. Multilevel facet hypertrophic changes are noted as well. No acute fracture or acute facet abnormality is noted. Multilevel neural foraminal narrowing is seen. Soft tissues and spinal canal: Surrounding soft tissue structures show no acute abnormality. Upper chest: Visualized lung apices are unremarkable. Other: None IMPRESSION: CT of the head: No acute intracranial abnormality noted. Mild mucosal thickening in the paranasal sinuses. CT of the cervical spine: Multilevel degenerative changes without acute abnormality. Electronically Signed   By: Inez Catalina M.D.   On: 04/21/2020 23:57  DG Hand Complete Right  Result Date: 04/21/2020 CLINICAL DATA:  Thumb pain after fall. EXAM: RIGHT HAND - COMPLETE 3+ VIEW COMPARISON:  Left hand radiograph August 04, 2017. FINDINGS: There is no evidence of fracture or dislocation. Mild degenerative change of the interphalangeal joints. Soft tissues are unremarkable. IMPRESSION: No fracture or dislocation of the right hand. Electronically Signed   By: Dahlia Bailiff MD   On: 04/21/2020 23:43      IMPRESSION AND PLAN:  Active Problems:   Recurrent syncope  1.  Recurrent syncope. -The patient will be admitted to an  observation medical monitored bed. -We will check his orthostatics every shift. -He will be hydrated with IV normal saline. -We will monitor him for arrhythmias. -This could be related to vasodilator effect of alcohol may be together with sildenafil. -We will obtain 2D echo and cardiology consult as well as bilateral carotid Doppler. -I notified Dr. Curt Bears about the patient.  2.  Mild alcohol abuse with current alcohol intoxication without complications. -I counseled him regarding the amount of alcohol he is drinking. -He will be monitored for alcohol withdrawal. -We will place him on as needed IV Ativan and a banana bag daily.  3.  Hypokalemia. -Potassium will be replaced and magnesium level will be checked.  4.  Hyponatremia. -This is like secondary to beer potomania. -He will be hydrated with IV normal saline and will follow his BMP.  5.  Essential hypertension. -We will continue his Norvasc and Cozaar.  6.  BPH. -We will continue his Flomax.  7.  Glaucoma. -We will continue his ophthalmic gtt.'s.  DVT prophylaxis: Lovenox. Code Status: full code. Family Communication:  The plan of care was discussed in details with the patient (with no family available at bedside). I answered all questions. The patient agreed to proceed with the above mentioned plan. Further management will depend upon hospital course. Disposition Plan: Back to previous home environment Consults called: Cardiology.   All the records are reviewed and case discussed with ED provider.  Status is: Observation  The patient remains OBS appropriate and will d/c before 2 midnights.  Dispo: The patient is from: Home              Anticipated d/c is to: Home              Patient currently is not medically stable to d/c.   Difficult to place patient No  TOTAL TIME TAKING CARE OF THIS PATIENT: 55 minutes.    Christel Mormon M.D on 04/22/2020 at 12:24 AM  [facility]   From 7 PM-7 AM, contact  night-coverage [email or web address]  CC: Primary care physician; Juluis Pitch, MD

## 2020-04-22 NOTE — ED Provider Notes (Signed)
[facility] Emergency Department Provider Note  ____________________________________________  Time seen: Approximately 12:01 AM  I have reviewed the triage vital signs and the nursing notes.   HISTORY  Chief Complaint Loss of Consciousness   HPI Matthew Webster is a 69 y.o. male with a history of alcohol abuse, hypertension who presents for evaluation of syncope.  Patient reports that this is his second syncope event in the last week.  Denies any prior episodes of syncope.  denies any prewarning symptoms.   Patient reports that the first episode was Monday when he was trying to let the dog come into the house and next thing he knows he found himself on the floor.  This evening again he was sitting on a chair and without any warning passed out and fell face forward onto the floor.  Patient denies any known history of cardiac dysrhythmias.  Patient does report drinking approximately 3 beers tonight.  He reports drinking 4-5 beers a night for the last 15 years.  He denies any known history of cirrhosis of the liver.  Denies headache, chest pain, dizziness, shortness of breath both preceding or after the syncopal events.  He reports eating and drinking normally.  He denies any confusion when he wakes up from these episodes, bowel or bladder loss, tongue trauma.  No history of seizures.  Past Medical History:  Diagnosis Date  . Actinic keratosis   . Anxiety   . Benign non-nodular prostatic hyperplasia with lower urinary tract symptoms   . 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 Depression 11/24/2013   Overview:  Questionable bipolar  . Diverticulosis   . Fatty liver    on CT  . Hemorrhoids   . History of chicken pox   . Hx of basal cell carcinoma 12/24/2016   Left anterior ear helix  . Hx of basal cell carcinoma 06/24/2017   Left upper forehead  . Hx of basal cell carcinoma 08/14/2017   Right upper forehead  . Hypertension 11/24/2013  . Personal history of  colonic polyps 02/24/2014    Patient Active Problem List   Diagnosis Date Noted  . Enlarged prostate with urinary obstruction 06/20/2014  . Cancer of sigmoid colon ([place]) 06/20/2014  . Depression 11/24/2013  . Hypertension 11/24/2013    Past Surgical History:  Procedure Laterality Date  . CATARACT EXTRACTION W/ INTRAOCULAR LENS IMPLANT Left 2011  . CATARACT EXTRACTION W/PHACO Right 05/26/2019   Procedure: CATARACT EXTRACTION PHACO AND INTRAOCULAR LENS PLACEMENT (IOC) RIGHT 10.64  01:10.2  15.1%;  Surgeon: Leandrew Koyanagi, MD;  Location: [place];  Service: Ophthalmology;  Laterality: Right;  . COLON SURGERY    . COLONOSCOPY WITH PROPOFOL N/A 07/22/2017   Procedure: COLONOSCOPY WITH PROPOFOL;  Surgeon: Lollie Sails, MD;  Location: [facility] ENDOSCOPY;  Service: Endoscopy;  Laterality: N/A;  . COLONOSCOPY WITH PROPOFOL N/A 07/21/2019   Procedure: COLONOSCOPY WITH PROPOFOL;  Surgeon: Toledo, Benay Pike, MD;  Location: [facility] ENDOSCOPY;  Service: Gastroenterology;  Laterality: N/A;  . DIAGNOSTIC LAPAROSCOPY    . ESOPHAGOGASTRODUODENOSCOPY N/A 07/21/2019   Procedure: ESOPHAGOGASTRODUODENOSCOPY (EGD);  Surgeon: Toledo, Benay Pike, MD;  Location: [facility] ENDOSCOPY;  Service: Gastroenterology;  Laterality: N/A;  . EYE SURGERY     DETACHED RETINA  . LAPAROSCOPIC BOWEL RESECTION    . RETINAL DETACHMENT SURGERY    . TONSILLECTOMY    . WISDOM TOOTH EXTRACTION      Prior to Admission medications   Medication Sig Start Date End Date Taking? Authorizing  Provider  amLODipine (NORVASC) 5 MG tablet Take 5 mg by mouth daily.    [provider]  latanoprost (XALATAN) 0.005 % ophthalmic solution  08/16/15   [provider]  losartan-hydrochlorothiazide (HYZAAR) 50-12.5 MG tablet Take by mouth. 06/13/15   [provider]  sildenafil (REVATIO) 20 MG tablet Take 20 mg by mouth 3 (three) times daily as needed.    [provider]  tamsulosin (FLOMAX) 0.4 MG CAPS  capsule TAKE 1 CAPSULE(0.4 MG) BY MOUTH DAILY 02/18/20   Hollice Espy, MD  timolol (TIMOPTIC) 0.5 % ophthalmic solution instill 1 drop into left eye every morning 08/17/15   [provider]  venlafaxine XR (EFFEXOR-XR) 150 MG 24 hr capsule  10/01/15   [provider]    Allergies Alphagan p [brimonidine tartrate], Codeine, Shellfish allergy, Penicillin v potassium, and Penicillins  Family History  Problem Relation Age of Onset  . CVA Mother   . Diabetes Father   . Prostate cancer Neg Hx   . Bladder Cancer Neg Hx   . Kidney cancer Neg Hx     Social History Social History   Tobacco Use  . Smoking status: Never Smoker  . Smokeless tobacco: Never Used  Vaping Use  . Vaping Use: Never used  Substance Use Topics  . Alcohol use: Yes    Alcohol/week: 21.0 standard drinks    Types: 21 Cans of beer per week    Comment: 3 25 oz beers per night  . Drug use: No    Review of Systems  Constitutional: Negative for fever. + syncope Eyes: Negative for visual changes. ENT: Negative for sore throat. Neck: No neck pain  Cardiovascular: Negative for chest pain. Respiratory: Negative for shortness of breath. Gastrointestinal: Negative for abdominal pain, vomiting or diarrhea. Genitourinary: Negative for dysuria. Musculoskeletal: Negative for back pain. Skin: Negative for rash. Neurological: Negative for headaches, weakness or numbness. Psych: No SI or HI  ____________________________________________   PHYSICAL EXAM:  VITAL SIGNS: ED Triage Vitals  Enc Vitals Group     BP 04/21/20 2236 120/81     Pulse Rate 04/21/20 2236 69     Resp 04/21/20 2236 18     Temp 04/21/20 2236 98.3 F (36.8 C)     Temp Source 04/21/20 2236 Oral     SpO2 04/21/20 2236 94 %     Weight 04/21/20 2237 206 lb 2.1 oz (93.5 kg)     Height 04/21/20 2237 5\' 6"  (1.676 m)     Head Circumference --      Peak Flow --      Pain Score --      Pain Loc --      Pain Edu? --      Excl. in [place]?  --     Constitutional: Alert and oriented. Well appearing and in no apparent distress. HEENT:      Head: Normocephalic and atraumatic.         Face: small superficial linear laceration of the upper lip not involving the vermilion border      Eyes: Conjunctivae are normal. Sclera is non-icteric.       Mouth/Throat: Mucous membranes are moist.       Neck: Supple with no signs of meningismus.  No C-spine tenderness Cardiovascular: Regular rate and rhythm. No murmurs, gallops, or rubs. 2+ symmetrical distal pulses are present in all extremities. No JVD. Respiratory: Normal respiratory effort. Lungs are clear to auscultation bilaterally.  Gastrointestinal: Soft, non tender, and non distended. Musculoskeletal:  No edema, cyanosis, or erythema of extremities.  Full painless range of motion of all extremities, no T and L-spine tenderness Neurologic: Normal speech and language. Face is symmetric. Moving all extremities. No gross focal neurologic deficits are appreciated. Skin: Skin is warm, dry and intact. No rash noted. Psychiatric: Mood and affect are normal. Speech and behavior are normal.  ____________________________________________   LABS (all labs ordered are listed, but only abnormal results are displayed)  Labs Reviewed  CBC WITH DIFFERENTIAL/PLATELET - Abnormal; Notable for the following components:      Result Value   Monocytes Absolute 1.1 (*)    All other components within normal limits  COMPREHENSIVE METABOLIC PANEL - Abnormal; Notable for the following components:   Sodium 132 (*)    Potassium 3.4 (*)    Glucose, Bld 118 (*)    Calcium 8.6 (*)    Total Protein 6.3 (*)    AST 55 (*)    ALT 52 (*)    All other components within normal limits  ETHANOL - Abnormal; Notable for the following components:   Alcohol, Ethyl (B) 110 (*)    All other components within normal limits  RESP PANEL BY RT-PCR (FLU A&B, COVID) ARPGX2  URINE DRUG SCREEN, QUALITATIVE ([facility] ONLY)  AMMONIA   PROTIME-INR  TROPONIN I (HIGH SENSITIVITY)   ____________________________________________  EKG  ED ECG REPORT I, Rudene Re, the attending physician, personally viewed and interpreted this ECG.  Normal sinus rhythm, normal intervals, normal axis, no STE or depressions, no evidence of HOCM, AV block, delta wave, ARVD, prolonged QTc, WPW, or [place].   ____________________________________________  RADIOLOGY  I have personally reviewed the images performed during this visit and I agree with the Radiologist's read.   Interpretation by Radiologist:  CT Head Wo Contrast  Result Date: 04/21/2020 CLINICAL DATA:  Recent syncopal episode EXAM: CT HEAD WITHOUT CONTRAST CT CERVICAL SPINE WITHOUT CONTRAST TECHNIQUE: Multidetector CT imaging of the head and cervical spine was performed following the standard protocol without intravenous contrast. Multiplanar CT image reconstructions of the cervical spine were also generated. COMPARISON:  None. FINDINGS: CT HEAD FINDINGS Brain: No evidence of acute infarction, hemorrhage, hydrocephalus, extra-axial collection or mass lesion/mass effect. Vascular: No hyperdense vessel or unexpected calcification. Skull: Normal. Negative for fracture or focal lesion. Sinuses/Orbits: Mild mucosal thickening is noted within the ethmoid and maxillary sinuses. Other: None. CT CERVICAL SPINE FINDINGS Alignment: Within normal limits. Skull base and vertebrae: 7 cervical segments are well visualized. Vertebral body height is well maintained. Osteophytic changes are noted throughout the cervical spine. Multilevel facet hypertrophic changes are noted as well. No acute fracture or acute facet abnormality is noted. Multilevel neural foraminal narrowing is seen. Soft tissues and spinal canal: Surrounding soft tissue structures show no acute abnormality. Upper chest: Visualized lung apices are unremarkable. Other: None IMPRESSION: CT of the head: No acute intracranial abnormality  noted. Mild mucosal thickening in the paranasal sinuses. CT of the cervical spine: Multilevel degenerative changes without acute abnormality. Electronically Signed   By: Inez Catalina M.D.   On: 04/21/2020 23:57   CT Cervical Spine Wo Contrast  Result Date: 04/21/2020 CLINICAL DATA:  Recent syncopal episode EXAM: CT HEAD WITHOUT CONTRAST CT CERVICAL SPINE WITHOUT CONTRAST TECHNIQUE: Multidetector CT imaging of the head and cervical spine was performed following the standard protocol without intravenous contrast. Multiplanar CT image reconstructions of the cervical spine were also generated. COMPARISON:  None. FINDINGS: CT HEAD FINDINGS Brain: No evidence of acute infarction, hemorrhage, hydrocephalus, extra-axial collection  or mass lesion/mass effect. Vascular: No hyperdense vessel or unexpected calcification. Skull: Normal. Negative for fracture or focal lesion. Sinuses/Orbits: Mild mucosal thickening is noted within the ethmoid and maxillary sinuses. Other: None. CT CERVICAL SPINE FINDINGS Alignment: Within normal limits. Skull base and vertebrae: 7 cervical segments are well visualized. Vertebral body height is well maintained. Osteophytic changes are noted throughout the cervical spine. Multilevel facet hypertrophic changes are noted as well. No acute fracture or acute facet abnormality is noted. Multilevel neural foraminal narrowing is seen. Soft tissues and spinal canal: Surrounding soft tissue structures show no acute abnormality. Upper chest: Visualized lung apices are unremarkable. Other: None IMPRESSION: CT of the head: No acute intracranial abnormality noted. Mild mucosal thickening in the paranasal sinuses. CT of the cervical spine: Multilevel degenerative changes without acute abnormality. Electronically Signed   By: Inez Catalina M.D.   On: 04/21/2020 23:57   DG Hand Complete Right  Result Date: 04/21/2020 CLINICAL DATA:  Thumb pain after fall. EXAM: RIGHT HAND - COMPLETE 3+ VIEW COMPARISON:   Left hand radiograph August 04, 2017. FINDINGS: There is no evidence of fracture or dislocation. Mild degenerative change of the interphalangeal joints. Soft tissues are unremarkable. IMPRESSION: No fracture or dislocation of the right hand. Electronically Signed   By: Dahlia Bailiff MD   On: 04/21/2020 23:43     ____________________________________________   PROCEDURES  Procedure(s) performed:yes .1-3 Lead EKG Interpretation Performed by: Rudene Re, MD Authorized by: Rudene Re, MD     Interpretation: normal     ECG rate assessment: normal     Rhythm: sinus rhythm     Ectopy: none     Conduction: normal     Critical Care performed:  None ____________________________________________   INITIAL IMPRESSION / ASSESSMENT AND PLAN / ED COURSE  70 y.o. male with a history of alcohol abuse, hypertension who presents for evaluation of syncope x 2 this week without preceding warning. Patient this time sustained a fall face down after syncope and has a liner upper lip laceration. No other injuries.  Otherwise well-appearing with normal vital signs.  No other signs of injury.   Ddx cardiac dysrhythmia versus dehydration versus anemia versus alcohol intoxication versus AKI versus electrolyte derangements.  EKG with no signs of ischemia or dysrhythmias.  Patient placed on telemetry for monitoring for any signs of dysrhythmias.  Labs with no significant electrolyte derangements or dehydration.  Mildly elevated LFTs which is expected with 50 years of alcohol consumption.  Patient's alcohol level is 110.  Initial troponin is negative.  CT head and cervical spine were done to rule out any traumatic injury.  Imaging visualized by me and read by radiology as negative.  Since patient has had 2 syncopal events with no preceding signs I am concerned for possible cardiac dysrhythmias.  Therefore will discuss with hospitalist for admission for further evaluation.  Old medical records  reviewed       _____________________________________________ Please note:  Patient was evaluated in Emergency Department today for the symptoms described in the history of present illness. Patient was evaluated in the context of the global COVID-19 pandemic, which necessitated consideration that the patient might be at risk for infection with the SARS-CoV-2 virus that causes COVID-19. Institutional protocols and algorithms that pertain to the evaluation of patients at risk for COVID-19 are in a state of rapid change based on information released by regulatory bodies including the CDC and federal and state organizations. These policies and algorithms were followed during the patient's care  in the ED.  Some ED evaluations and interventions may be delayed as a result of limited staffing during the pandemic.   [place] Controlled Substance Database was reviewed by me. ____________________________________________   FINAL CLINICAL IMPRESSION(S) / ED DIAGNOSES   Final diagnoses:  Syncope, unspecified syncope type      NEW MEDICATIONS STARTED DURING THIS VISIT:  ED Discharge Orders    None       Note:  This document was prepared using Dragon voice recognition software and may include unintentional dictation errors.    Alfred Levins, [place], MD 04/22/20 [phone]

## 2020-04-22 NOTE — Consult Note (Addendum)
Cardiology Consultation:   Patient ID: [name] MRN: [id number]; DOB: Apr 04, 1950  Admit date: 04/21/2020 Date of Consult: 04/22/2020  PCP:  Juluis Pitch, MD   [place]  Cardiologist: New Advanced Practice Provider:  No care team member to display Electrophysiologist:  None    Patient Profile:   Matthew Webster is a 70 y.o. male with a hx of hypertension, BPH, depression, alcohol use, fatty liver who is being seen today for the evaluation of syncope at the request of Dr. Sidney Ace.  History of Present Illness:   [name] is no significant cardiac history. Denies history of MI, stent, stress test, CHF, diabetes, HLD. No family cardiac history. He has hx of HTN. Patient reports significant alcohol use, four 25 ounce beers daily. Denies tobacco and drug use. He is active, biking 3-4 times a week. He denies anginal symptoms with this. He reports a healthy diet.  Patient presented to the [facility] ER 04/22/2020 for loss of consciousness.  Patient was sitting watching TV when he had acute onset syncope. Earlier that morning he had small dose of sildenafil.  Right before he fell he drank something that went down the wrong way and he had a coughing fit, to the point it was difficult to breath. Had also drank 3 beers by that point. He awoke on the floor and is unsure how long he was out for, possible 2 minutes.  He had a lip laceration. He denies and pre-syncopal symptoms. No chest pain, shortness of breath, palpitations, dizziness, lightheadedness, flushing, headache prior to fall.  No recent fever, chills, nausea, vomiting.  Patient reports a similar episode over the weekend. At that time he had drank a couple beers as well. He did not have a coughing fit prior to fall. His wife was there who said he was out for 2 minutes. Felt as if it was a sudden blackout.  In the ER vitals were stable.  Labs showed potassium 3.4, sodium 132, creatinine 0.84, BUN 15, glucose 118, AST  55, ALT 52, WBC 8.4, hemoglobin 15.7. HS troponin 10>12.  Covid negative.  UDS negative.  Alcohol level 110.  CT of the head and neck with no acute process.  Ultrasound of bilateral carotids with no hemodynamically significant stenosis.  EKG showed sinus rhythm, possible first-degree AV block, 70 bpm, anterior q waves.   Past Medical History:  Diagnosis Date  . Actinic keratosis   . Anxiety   . Benign non-nodular prostatic hyperplasia with lower urinary tract symptoms   . Cancer of sigmoid colon ([place]) 06/20/2014  . Depression 11/24/2013   Overview:  Questionable bipolar  . Diverticulosis   . Fatty liver    on CT  . Hemorrhoids   . History of chicken pox   . Hx of basal cell carcinoma 12/24/2016   Left anterior ear helix  . Hx of basal cell carcinoma 06/24/2017   Left upper forehead  . Hx of basal cell carcinoma 08/14/2017   Right upper forehead  . Hypertension 11/24/2013  . Personal history of colonic polyps 02/24/2014    Past Surgical History:  Procedure Laterality Date  . CATARACT EXTRACTION W/ INTRAOCULAR LENS IMPLANT Left 2011  . CATARACT EXTRACTION W/PHACO Right 05/26/2019   Procedure: CATARACT EXTRACTION PHACO AND INTRAOCULAR LENS PLACEMENT (IOC) RIGHT 10.64  01:10.2  15.1%;  Surgeon: Leandrew Koyanagi, MD;  Location: [place];  Service: Ophthalmology;  Laterality: Right;  . COLON SURGERY    . COLONOSCOPY WITH PROPOFOL N/A 07/22/2017  Procedure: COLONOSCOPY WITH PROPOFOL;  Surgeon: Lollie Sails, MD;  Location: [facility] ENDOSCOPY;  Service: Endoscopy;  Laterality: N/A;  . COLONOSCOPY WITH PROPOFOL N/A 07/21/2019   Procedure: COLONOSCOPY WITH PROPOFOL;  Surgeon: Toledo, Benay Pike, MD;  Location: [facility] ENDOSCOPY;  Service: Gastroenterology;  Laterality: N/A;  . DIAGNOSTIC LAPAROSCOPY    . ESOPHAGOGASTRODUODENOSCOPY N/A 07/21/2019   Procedure: ESOPHAGOGASTRODUODENOSCOPY (EGD);  Surgeon: Toledo, Benay Pike, MD;  Location: [facility] ENDOSCOPY;  Service: Gastroenterology;   Laterality: N/A;  . EYE SURGERY     DETACHED RETINA  . LAPAROSCOPIC BOWEL RESECTION    . RETINAL DETACHMENT SURGERY    . TONSILLECTOMY    . WISDOM TOOTH EXTRACTION       Home Medications:  Prior to Admission medications   Medication Sig Start Date End Date Taking? Authorizing Provider  amLODipine (NORVASC) 5 MG tablet Take 5 mg by mouth daily.   Yes [provider]  erythromycin ophthalmic ointment Place 1 application into both eyes at bedtime. 04/07/20  Yes [provider]  latanoprost (XALATAN) 0.005 % ophthalmic solution Place 1 drop into both eyes at bedtime. 08/16/15  Yes [provider]  losartan-hydrochlorothiazide (HYZAAR) 50-12.5 MG tablet Take 1 tablet by mouth daily. 06/13/15  Yes [provider]  sildenafil (REVATIO) 20 MG tablet Take 20 mg by mouth 3 (three) times daily as needed.   Yes [provider]  tamsulosin (FLOMAX) 0.4 MG CAPS capsule TAKE 1 CAPSULE(0.4 MG) BY MOUTH DAILY 02/18/20  Yes Hollice Espy, MD  timolol (TIMOPTIC) 0.5 % ophthalmic solution instill 1 drop into left eye every morning 08/17/15  Yes [provider]  venlafaxine XR (EFFEXOR-XR) 150 MG 24 hr capsule Take 150 mg by mouth daily with breakfast. 10/01/15  Yes [provider]    Inpatient Medications: Scheduled Meds: . amLODipine  5 mg Oral Daily  . enoxaparin (LOVENOX) injection  0.5 mg/kg Subcutaneous Q24H  . losartan  50 mg Oral Daily   And  . hydrochlorothiazide  12.5 mg Oral Daily  . latanoprost  1 drop Left Eye QHS  . sodium chloride flush  3 mL Intravenous Q12H  . tamsulosin  0.4 mg Oral Daily  . timolol  1 drop Left Eye Daily  . venlafaxine XR  150 mg Oral Q2200   Continuous Infusions: . sodium chloride 100 mL/hr at 04/22/20 0239   PRN Meds: acetaminophen **OR** acetaminophen, alum & mag hydroxide-simeth, magnesium hydroxide, ondansetron **OR** ondansetron (ZOFRAN) IV, traZODone  Allergies:    Allergies  Allergen Reactions   . Alphagan P [Brimonidine Tartrate] Swelling    Eye redness, swelling, and discharge  . Codeine     constipation  . Shellfish Allergy Diarrhea and Nausea And Vomiting    Raw clams  . Penicillin V Potassium Rash  . Penicillins Rash    Social History:   Social History   Socioeconomic History  . Marital status: Married    Spouse name: Not on file  . Number of children: Not on file  . Years of education: Not on file  . Highest education level: Not on file  Occupational History  . Not on file  Tobacco Use  . Smoking status: Never Smoker  . Smokeless tobacco: Never Used  Vaping Use  . Vaping Use: Never used  Substance and Sexual Activity  . Alcohol use: Yes    Alcohol/week: 21.0 standard drinks    Types: 21 Cans of beer per week    Comment: 4  25 oz beers per night  .  Drug use: No  . Sexual activity: Not on file  Other Topics Concern  . Not on file  Social History Narrative  . Not on file   Social Determinants of Health   Financial Resource Strain: Not on file  Food Insecurity: Not on file  Transportation Needs: Not on file  Physical Activity: Not on file  Stress: Not on file  Social Connections: Not on file  Intimate Partner Violence: Not on file    Family History:    Family History  Problem Relation Age of Onset  . CVA Mother   . Diabetes Father   . Prostate cancer Neg Hx   . Bladder Cancer Neg Hx   . Kidney cancer Neg Hx      ROS:  Please see the history of present illness.  All other ROS reviewed and negative.     Physical Exam/Data:   Vitals:   04/22/20 0021 04/22/20 0100 04/22/20 0140 04/22/20 0425  BP: 137/85 125/78 140/89 (!) 146/77  Pulse: 67 60 (!) 59 64  Resp: 18 16 20 18   Temp:   97.9 F (36.6 C) 97.8 F (36.6 C)  TempSrc:   Oral Oral  SpO2: 94% 92% 97% 92%  Weight:      Height:        Intake/Output Summary (Last 24 hours) at 04/22/2020 0723 Last data filed at 04/22/2020 0458 Gross per 24 hour  Intake 230.84 ml  Output 950 ml   Net -719.16 ml   Last 3 Weights 04/21/2020 03/07/2020 02/01/2020  Weight (lbs) 206 lb 2.1 oz 190 lb 197 lb  Weight (kg) 93.5 kg 86.183 kg 89.359 kg     Body mass index is 33.27 kg/m.  General:  Well nourished, well developed, in no acute distress HEENT: normal Lymph: no adenopathy Neck: no JVD Endocrine:  No thryomegaly Vascular: No carotid bruits; FA pulses 2+ bilaterally without bruits  Cardiac:  normal S1, S2; RRR; no murmur  Lungs:  clear to auscultation bilaterally, no wheezing, rhonchi or rales  Abd: soft, nontender, no hepatomegaly  Ext: no edema Musculoskeletal:  No deformities, BUE and BLE strength normal and equal Skin: warm and dry  Neuro:  CNs 2-12 intact, no focal abnormalities noted Psych:  Normal affect   EKG:  The EKG was personally reviewed and demonstrates:  SR, 70 bpm, anteiror q waves, no ST/T wave changes Telemetry:  Telemetry was personally reviewed and demonstrates:  SR/SB, HR upper 50s-70s. Rare PVCs  Relevant CV Studies:  Echo ordered  Laboratory Data:  High Sensitivity Troponin:   Recent Labs  Lab 04/21/20 2251 04/22/20 0404  TROPONINIHS 10 12     Chemistry Recent Labs  Lab 04/21/20 2251 04/22/20 0404  NA 132* 137  K 3.4* 4.8  CL 99 103  CO2 23 26  GLUCOSE 118* 138*  BUN 15 13  CREATININE 0.84 0.84  CALCIUM 8.6* 8.9  GFRNONAA >60 >60  ANIONGAP 10 8    Recent Labs  Lab 04/21/20 2251 04/22/20 0404  PROT 6.3* 6.9  ALBUMIN 3.6 3.9  AST 55* 59*  ALT 52* 52*  ALKPHOS 86 100  BILITOT 0.9 0.6   Hematology Recent Labs  Lab 04/21/20 2251 04/22/20 0404  WBC 8.4 9.5  RBC 4.63 4.80  HGB 15.7 16.4  HCT 43.8 45.5  MCV 94.6 94.8  MCH 33.9 34.2*  MCHC 35.8 36.0  RDW 12.7 12.6  PLT 175 170   BNPNo results for input(s): BNP, PROBNP in the last 168 hours.  DDimer  No results for input(s): DDIMER in the last 168 hours.   Radiology/Studies:  CT Head Wo Contrast  Result Date: 04/21/2020 CLINICAL DATA:  Recent syncopal episode  EXAM: CT HEAD WITHOUT CONTRAST CT CERVICAL SPINE WITHOUT CONTRAST TECHNIQUE: Multidetector CT imaging of the head and cervical spine was performed following the standard protocol without intravenous contrast. Multiplanar CT image reconstructions of the cervical spine were also generated. COMPARISON:  None. FINDINGS: CT HEAD FINDINGS Brain: No evidence of acute infarction, hemorrhage, hydrocephalus, extra-axial collection or mass lesion/mass effect. Vascular: No hyperdense vessel or unexpected calcification. Skull: Normal. Negative for fracture or focal lesion. Sinuses/Orbits: Mild mucosal thickening is noted within the ethmoid and maxillary sinuses. Other: None. CT CERVICAL SPINE FINDINGS Alignment: Within normal limits. Skull base and vertebrae: 7 cervical segments are well visualized. Vertebral body height is well maintained. Osteophytic changes are noted throughout the cervical spine. Multilevel facet hypertrophic changes are noted as well. No acute fracture or acute facet abnormality is noted. Multilevel neural foraminal narrowing is seen. Soft tissues and spinal canal: Surrounding soft tissue structures show no acute abnormality. Upper chest: Visualized lung apices are unremarkable. Other: None IMPRESSION: CT of the head: No acute intracranial abnormality noted. Mild mucosal thickening in the paranasal sinuses. CT of the cervical spine: Multilevel degenerative changes without acute abnormality. Electronically Signed   By: Inez Catalina M.D.   On: 04/21/2020 23:57   CT Cervical Spine Wo Contrast  Result Date: 04/21/2020 CLINICAL DATA:  Recent syncopal episode EXAM: CT HEAD WITHOUT CONTRAST CT CERVICAL SPINE WITHOUT CONTRAST TECHNIQUE: Multidetector CT imaging of the head and cervical spine was performed following the standard protocol without intravenous contrast. Multiplanar CT image reconstructions of the cervical spine were also generated. COMPARISON:  None. FINDINGS: CT HEAD FINDINGS Brain: No evidence of  acute infarction, hemorrhage, hydrocephalus, extra-axial collection or mass lesion/mass effect. Vascular: No hyperdense vessel or unexpected calcification. Skull: Normal. Negative for fracture or focal lesion. Sinuses/Orbits: Mild mucosal thickening is noted within the ethmoid and maxillary sinuses. Other: None. CT CERVICAL SPINE FINDINGS Alignment: Within normal limits. Skull base and vertebrae: 7 cervical segments are well visualized. Vertebral body height is well maintained. Osteophytic changes are noted throughout the cervical spine. Multilevel facet hypertrophic changes are noted as well. No acute fracture or acute facet abnormality is noted. Multilevel neural foraminal narrowing is seen. Soft tissues and spinal canal: Surrounding soft tissue structures show no acute abnormality. Upper chest: Visualized lung apices are unremarkable. Other: None IMPRESSION: CT of the head: No acute intracranial abnormality noted. Mild mucosal thickening in the paranasal sinuses. CT of the cervical spine: Multilevel degenerative changes without acute abnormality. Electronically Signed   By: Inez Catalina M.D.   On: 04/21/2020 23:57   US Carotid Bilateral  Result Date: 04/22/2020 CLINICAL DATA:  Recent syncopal episode EXAM: BILATERAL CAROTID DUPLEX ULTRASOUND TECHNIQUE: Pearline Cables scale imaging, color Doppler and duplex ultrasound were performed of bilateral carotid and vertebral arteries in the neck. COMPARISON:  None FINDINGS: Criteria: Quantification of carotid stenosis is based on velocity parameters that correlate the residual internal carotid diameter with NASCET-based stenosis levels, using the diameter of the distal internal carotid lumen as the denominator for stenosis measurement. The following velocity measurements were obtained: RIGHT ICA: 76/14 cm/sec CCA: 67/59 cm/sec SYSTOLIC ICA/CCA RATIO:  0.9 ECA: 101 cm/sec LEFT ICA: 80/31 cm/sec CCA: 16/38 cm/sec SYSTOLIC ICA/CCA RATIO:  0.9 ECA: 136 cm/sec RIGHT CAROTID ARTERY:  Evaluation of preliminary grayscale images show no significant atherosclerotic plaque. Evaluation the waveforms, velocities and  flow velocity ratios show no evidence of focal hemodynamically significant stenosis. RIGHT VERTEBRAL ARTERY:  Antegrade in nature. LEFT CAROTID ARTERY: Evaluation preliminary grayscale images demonstrate no significant atherosclerotic plaque. The waveforms, velocities and flow velocity ratios show no evidence of focal hemodynamically significant stenosis. LEFT VERTEBRAL ARTERY:  Antegrade in nature. IMPRESSION: No evidence of hemodynamically significant stenosis in the carotid arteries. Electronically Signed   By: Inez Catalina M.D.   On: 04/22/2020 01:11   DG Hand Complete Right  Result Date: 04/21/2020 CLINICAL DATA:  Thumb pain after fall. EXAM: RIGHT HAND - COMPLETE 3+ VIEW COMPARISON:  Left hand radiograph August 04, 2017. FINDINGS: There is no evidence of fracture or dislocation. Mild degenerative change of the interphalangeal joints. Soft tissues are unremarkable. IMPRESSION: No fracture or dislocation of the right hand. Electronically Signed   By: Dahlia Bailiff MD   On: 04/21/2020 23:43     Assessment and Plan:   Syncope -Reported sudden LOC with no symptoms prior to LOC. In the setting of alcohol intake and coughing fit -Labs showed alcohol level 110, troponin negative, LFTs mildly elevated.  Otherwise labs were unremarkable -Imaging nonacute -EKG showed SR with no significant arrhythmia or pauses -Orthostatics ordered -Tele unrevealing. Continue to monitor on telemetry -Echo ordered - Possibly vasovagal although he had an earlier episode that would not suggest this. Worrisome since he describes it as a sudden blackout.  -Would consider heart monitor at discharge -Recommend decrease alcohol intake  Alcohol abuse -Cessation/decreased intake discussed  Hypertension -PTA amlodipine 5 mg daily, losartan hydrochlorothiazide 50-12.5 mg daily -Pressures  reasonable  Hypokalemia -Supplement as to goal greater than 4  For questions or updates, please contact [facility] Please consult [email or web address] for contact info under    Signed, Cadence Arlyss Repress  04/22/2020 7:23 AM   Attending Note:   The patient was seen and examined.  Agree with assessment and plan as noted above.  Changes made to the above note as needed.  Patient seen and independently examined with  Adella Nissen, PA .   We discussed all aspects of the encounter. I agree with the assessment and plan as stated above.  1. Recurrent syncope:   He has had 2 episodes of syncope.   Both have occurred after drinking a large amount of alcohol ( equiv of 8 beers ) .   Last night , his syncope was preceded by choking on some liquid soup A week ago, he was bending over to let his dog in and then straightened up.  He does not remember much about these episodes - is a generally poor historian regarding his syncope   Tele was reviewed. No pauses or tachycardia that would cause syncope   Agree with echo  - not done yet   I would monitor overnight. He will need a 14 day event monitor upon discharge  Follow up in our [place] office.  He should also see his primary MD   Ive advised him to moderate his alcohol intake.   2.   Hyperglycemia :   Per IM       I have spent a total of 40 minutes with patient reviewing hospital  notes , telemetry, EKGs, labs and examining patient as well as establishing an assessment and plan that was discussed with the patient. > 50% of time was spent in direct patient care.    Thayer Headings, Brooke Bonito., MD, [facility] 04/22/2020, 11:46 AM [address],  [place] [phone] Pager  [phone]    

## 2020-04-22 NOTE — ED Notes (Signed)
Hospitalist at bedside 

## 2020-04-22 NOTE — Progress Notes (Addendum)
Nonbillable brief update note.  Please see same-day H&P for full billable details.  Briefly is a 71 year old male who presented for 2 episodes of syncope within 1 week.  Overall the patient has no cardiac history and is an reasonable level of health.  He does have a history of excessive alcohol intake and fatty liver disease.  Orthostatics negative.  Telemetry reassuring.  Echocardiogram pending.  Of note the patient does take sildenafil and states that he did take 1 on the morning of his syncopal event.  This is very possibly a contributing factor to the patient syncopal events.  Cardiology contacted on admission.  Plan: Continue telemetry monitoring Obtain 2D echocardiogram, follow-up on results Patient may benefit from outpatient Holter monitoring, cardiology to follow-up Echo pending Cards recommending overnight monitoring Anticipate dc tomorrow  Ralene Muskrat MD

## 2020-04-22 NOTE — Progress Notes (Signed)
PHARMACIST - PHYSICIAN COMMUNICATION  CONCERNING:  Enoxaparin (Lovenox) for DVT Prophylaxis    RECOMMENDATION: Patient was prescribed enoxaprin 40mg  q24 hours for VTE prophylaxis.   Filed Weights   04/21/20 2237  Weight: 93.5 kg (206 lb 2.1 oz)    Body mass index is 33.27 kg/m.  Estimated Creatinine Clearance: 87.6 mL/min (by C-G formula based on SCr of 0.84 mg/dL).   Based on [place] patient is candidate for enoxaparin 0.5mg /kg TBW SQ every 24 hours based on BMI being >30.   DESCRIPTION: Pharmacy has adjusted enoxaparin dose per [facility] policy.  Patient is now receiving enoxaparin 0.5 mg/kg every 24 hours   Renda Rolls, PharmD, [facility] 04/22/2020 1:06 AM

## 2020-04-23 ENCOUNTER — Other Ambulatory Visit: Payer: Self-pay | Admitting: Medical

## 2020-04-23 DIAGNOSIS — R55 Syncope and collapse: Secondary | ICD-10-CM

## 2020-04-23 LAB — GLUCOSE, CAPILLARY: Glucose-Capillary: 115 mg/dL — ABNORMAL HIGH (ref 70–99)

## 2020-04-23 NOTE — Discharge Summary (Signed)
Physician Discharge Summary  Matthew Webster LMB:[id number] DOB: Jul 01, 1950 DOA: 04/21/2020  PCP: Matthew Pitch, MD  Admit date: 04/21/2020 Discharge date: 04/23/2020  Admitted From: Home Disposition:  Home  Recommendations for Outpatient Follow-up:  1. Follow up with PCP in 1-2 weeks 2. Follow up with cardiology [place]: No Equipment/Devices: None Discharge Condition: Stable CODE STATUS: Full Diet recommendation: Heart Healthy Brief/Interim Summary: 70 year old male who presented for 2 episodes of syncope within 1 week.  Overall the patient has no cardiac history and is an reasonable level of health.  He does have a history of excessive alcohol intake and fatty liver disease.  Orthostatics negative.  Telemetry reassuring.  Echocardiogram pending.  Of note the patient does take sildenafil and states that he did take 1 on the morning of his syncopal event.  This is very possibly a contributing factor to the patient syncopal events.  Cardiology contacted on admission.  Patient was maintained on telemetry monitoring with no arrhythmias noted.  Patient maintained in sinus rhythm without it evidence of chest pain.  2D echocardiogram was ordered from admission however unfortunately no echo tech available at [facility] this weekend.  Discussed with cardiology.  Okay to defer echocardiogram outpatient.  Per cardiology they will send patient a cardiac event monitor in the mail and have him follow-up with [facility] in [place].  Had a lengthy discussion with the patient regarding his symptoms and lifestyle choices.  It appears that patient syncopal events were due to combination of hypotension, vasovagal in the setting of excessive alcohol use and sildenafil use.  At time of discharge no changes were made in home medications however we had a lengthy discussion regarding the potential risks of these medications and how best to minimize or mitigate these risks going  forward.  Discharge Diagnoses:  Active Problems:   Recurrent syncope  Recurrent syncope Likely vasovagal No evidence of arrhythmia Echocardiogram cannot be performed in-house Patient will be discharged with outpatient cardiology follow-up Avoid hypotension Decrease alcohol intake Follow-up PCP and cardiology  Essential hypertension Patient on amlodipine and hydrochlorothiazide/lisinopril Also on Flomax No changes made in her medications educated on the importance of good blood pressure monitoring and avoidance of hypotension to avoid syncopal events  Alcohol abuse Hepatic steatosis Patient drinks 100 ounces of beer a day Educated on danger of excessive alcohol intake  BPH Continue Flomax  Discharge Instructions  Discharge Instructions    Diet - low sodium heart healthy   Complete by: As directed    Increase activity slowly   Complete by: As directed      Allergies as of 04/23/2020      Reactions   Alphagan P [brimonidine Tartrate] Swelling   Eye redness, swelling, and discharge   Codeine    constipation   Shellfish Allergy Diarrhea, Nausea And Vomiting   Raw clams   Penicillin V Potassium Rash   Penicillins Rash      Medication List    TAKE these medications   amLODipine 5 MG tablet Commonly known as: NORVASC Take 5 mg by mouth daily.   erythromycin ophthalmic ointment Place 1 application into both eyes at bedtime.   latanoprost 0.005 % ophthalmic solution Commonly known as: XALATAN Place 1 drop into both eyes at bedtime.   losartan-hydrochlorothiazide 50-12.5 MG tablet Commonly known as: HYZAAR Take 1 tablet by mouth daily.   sildenafil 20 MG tablet Commonly known as: REVATIO Take 20 mg by mouth 3 (three) times daily as needed.  tamsulosin 0.4 MG Caps capsule Commonly known as: FLOMAX TAKE 1 CAPSULE(0.4 MG) BY MOUTH DAILY   timolol 0.5 % ophthalmic solution Commonly known as: TIMOPTIC instill 1 drop into left eye every morning    venlafaxine XR 150 MG 24 hr capsule Commonly known as: EFFEXOR-XR Take 150 mg by mouth daily with breakfast.       Follow-up Information    Matthew Pitch, MD. Schedule an appointment as soon as possible for a visit in 1 week(s).   Specialty: Family Medicine Contact information: [address] [address] [phone]        Matthew Merritts, MD. Schedule an appointment as soon as possible for a visit in 1 week(s).   Specialty: Cardiology Why: Dr. Acie Webster (cardiologist who saw you in the hospital) will set up a followup visit with [facility] in [place].  This is the office phone number if you do not hear from them Contact information: [place] [address] [phone]              Allergies  Allergen Reactions  . Alphagan P [Brimonidine Tartrate] Swelling    Eye redness, swelling, and discharge  . Codeine     constipation  . Shellfish Allergy Diarrhea and Nausea And Vomiting    Raw clams  . Penicillin V Potassium Rash  . Penicillins Rash    Consultations:  Cardiology-[facility]   Procedures/Studies: CT Head Wo Contrast  Result Date: 04/21/2020 CLINICAL DATA:  Recent syncopal episode EXAM: CT HEAD WITHOUT CONTRAST CT CERVICAL SPINE WITHOUT CONTRAST TECHNIQUE: Multidetector CT imaging of the head and cervical spine was performed following the standard protocol without intravenous contrast. Multiplanar CT image reconstructions of the cervical spine were also generated. COMPARISON:  None. FINDINGS: CT HEAD FINDINGS Brain: No evidence of acute infarction, hemorrhage, hydrocephalus, extra-axial collection or mass lesion/mass effect. Vascular: No hyperdense vessel or unexpected calcification. Skull: Normal. Negative for fracture or focal lesion. Sinuses/Orbits: Mild mucosal thickening is noted within the ethmoid and maxillary sinuses. Other: None. CT CERVICAL SPINE FINDINGS Alignment: Within normal limits. Skull base and  vertebrae: 7 cervical segments are well visualized. Vertebral body height is well maintained. Osteophytic changes are noted throughout the cervical spine. Multilevel facet hypertrophic changes are noted as well. No acute fracture or acute facet abnormality is noted. Multilevel neural foraminal narrowing is seen. Soft tissues and spinal canal: Surrounding soft tissue structures show no acute abnormality. Upper chest: Visualized lung apices are unremarkable. Other: None IMPRESSION: CT of the head: No acute intracranial abnormality noted. Mild mucosal thickening in the paranasal sinuses. CT of the cervical spine: Multilevel degenerative changes without acute abnormality. Electronically Signed   By: Inez Catalina M.D.   On: 04/21/2020 23:57   CT Cervical Spine Wo Contrast  Result Date: 04/21/2020 CLINICAL DATA:  Recent syncopal episode EXAM: CT HEAD WITHOUT CONTRAST CT CERVICAL SPINE WITHOUT CONTRAST TECHNIQUE: Multidetector CT imaging of the head and cervical spine was performed following the standard protocol without intravenous contrast. Multiplanar CT image reconstructions of the cervical spine were also generated. COMPARISON:  None. FINDINGS: CT HEAD FINDINGS Brain: No evidence of acute infarction, hemorrhage, hydrocephalus, extra-axial collection or mass lesion/mass effect. Vascular: No hyperdense vessel or unexpected calcification. Skull: Normal. Negative for fracture or focal lesion. Sinuses/Orbits: Mild mucosal thickening is noted within the ethmoid and maxillary sinuses. Other: None. CT CERVICAL SPINE FINDINGS Alignment: Within normal limits. Skull base and vertebrae: 7 cervical segments are well visualized. Vertebral body height  is well maintained. Osteophytic changes are noted throughout the cervical spine. Multilevel facet hypertrophic changes are noted as well. No acute fracture or acute facet abnormality is noted. Multilevel neural foraminal narrowing is seen. Soft tissues and spinal canal: Surrounding  soft tissue structures show no acute abnormality. Upper chest: Visualized lung apices are unremarkable. Other: None IMPRESSION: CT of the head: No acute intracranial abnormality noted. Mild mucosal thickening in the paranasal sinuses. CT of the cervical spine: Multilevel degenerative changes without acute abnormality. Electronically Signed   By: Inez Catalina M.D.   On: 04/21/2020 23:57   US Carotid Bilateral  Result Date: 04/22/2020 CLINICAL DATA:  Recent syncopal episode EXAM: BILATERAL CAROTID DUPLEX ULTRASOUND TECHNIQUE: Pearline Cables scale imaging, color Doppler and duplex ultrasound were performed of bilateral carotid and vertebral arteries in the neck. COMPARISON:  None FINDINGS: Criteria: Quantification of carotid stenosis is based on velocity parameters that correlate the residual internal carotid diameter with NASCET-based stenosis levels, using the diameter of the distal internal carotid lumen as the denominator for stenosis measurement. The following velocity measurements were obtained: RIGHT ICA: 76/14 cm/sec CCA: 28/78 cm/sec SYSTOLIC ICA/CCA RATIO:  0.9 ECA: 101 cm/sec LEFT ICA: 80/31 cm/sec CCA: 67/67 cm/sec SYSTOLIC ICA/CCA RATIO:  0.9 ECA: 136 cm/sec RIGHT CAROTID ARTERY: Evaluation of preliminary grayscale images show no significant atherosclerotic plaque. Evaluation the waveforms, velocities and flow velocity ratios show no evidence of focal hemodynamically significant stenosis. RIGHT VERTEBRAL ARTERY:  Antegrade in nature. LEFT CAROTID ARTERY: Evaluation preliminary grayscale images demonstrate no significant atherosclerotic plaque. The waveforms, velocities and flow velocity ratios show no evidence of focal hemodynamically significant stenosis. LEFT VERTEBRAL ARTERY:  Antegrade in nature. IMPRESSION: No evidence of hemodynamically significant stenosis in the carotid arteries. Electronically Signed   By: Inez Catalina M.D.   On: 04/22/2020 01:11   DG Hand Complete Right  Result Date:  04/21/2020 CLINICAL DATA:  Thumb pain after fall. EXAM: RIGHT HAND - COMPLETE 3+ VIEW COMPARISON:  Left hand radiograph August 04, 2017. FINDINGS: There is no evidence of fracture or dislocation. Mild degenerative change of the interphalangeal joints. Soft tissues are unremarkable. IMPRESSION: No fracture or dislocation of the right hand. Electronically Signed   By: Dahlia Bailiff MD   On: 04/21/2020 23:43    (Echo, Carotid, EGD, Colonoscopy, ERCP)    Subjective: Patient seen and examined on the day of discharge.  Stable, no distress.  Seen by cardiology and cleared for discharge.  Discharge Exam: Vitals:   04/23/20 0549 04/23/20 0813  BP: 122/89 (!) 132/98  Pulse: 70 (!) 58  Resp: 16 20  Temp: 97.8 F (36.6 C) 98.2 F (36.8 C)  SpO2: 93% 90%   Vitals:   04/23/20 0058 04/23/20 0500 04/23/20 0549 04/23/20 0813  BP: (!) 141/87  122/89 (!) 132/98  Pulse: (!) 58  70 (!) 58  Resp: 18  16 20   Temp: 98.8 F (37.1 C)  97.8 F (36.6 C) 98.2 F (36.8 C)  TempSrc: Oral  Oral Oral  SpO2: 94%  93% 90%  Weight:  93 kg    Height:        General: Pt is alert, awake, not in acute distress Cardiovascular: RRR, S1/S2 +, no rubs, no gallops Respiratory: CTA bilaterally, no wheezing, no rhonchi Abdominal: Soft, NT, ND, bowel sounds + Extremities: no edema, no cyanosis    The results of significant diagnostics from this hospitalization (including imaging, microbiology, ancillary and laboratory) are listed below for reference.     Microbiology: Recent Results (  from the past 240 hour(s))  Resp Panel by RT-PCR (Flu A&B, Covid) Nasopharyngeal Swab     Status: None   Collection Time: 04/22/20 12:13 AM   Specimen: Nasopharyngeal Swab; Nasopharyngeal(NP) swabs in vial transport medium  Result Value Ref Range Status   SARS Coronavirus 2 by RT PCR NEGATIVE NEGATIVE Final    Comment: (NOTE) SARS-CoV-2 target nucleic acids are NOT DETECTED.  The SARS-CoV-2 RNA is generally detectable in upper  respiratory specimens during the acute phase of infection. The lowest concentration of SARS-CoV-2 viral copies this assay can detect is 138 copies/mL. A negative result does not preclude SARS-Cov-2 infection and should not be used as the sole basis for treatment or other patient management decisions. A negative result may occur with  improper specimen collection/handling, submission of specimen other than nasopharyngeal swab, presence of viral mutation(s) within the areas targeted by this assay, and inadequate number of viral copies(<138 copies/mL). A negative result must be combined with clinical observations, patient history, and epidemiological information. The expected result is Negative.  Fact Sheet for Patients:  EntrepreneurPulse.com.au  Fact Sheet for Healthcare Providers:  IncredibleEmployment.be  This test is no t yet approved or cleared by the [place] FDA and  has been authorized for detection and/or diagnosis of SARS-CoV-2 by FDA under an Emergency Use Authorization (EUA). This EUA will remain  in effect (meaning this test can be used) for the duration of the COVID-19 declaration under Section 564(b)(1) of the Act, 21 U.S.C.section 360bbb-3(b)(1), unless the authorization is terminated  or revoked sooner.       Influenza A by PCR NEGATIVE NEGATIVE Final   Influenza B by PCR NEGATIVE NEGATIVE Final    Comment: (NOTE) The Xpert Xpress SARS-CoV-2/FLU/RSV plus assay is intended as an aid in the diagnosis of influenza from Nasopharyngeal swab specimens and should not be used as a sole basis for treatment. Nasal washings and aspirates are unacceptable for Xpert Xpress SARS-CoV-2/FLU/RSV testing.  Fact Sheet for Patients: EntrepreneurPulse.com.au  Fact Sheet for Healthcare Providers: IncredibleEmployment.be  This test is not yet approved or cleared by the [place] FDA and has been  authorized for detection and/or diagnosis of SARS-CoV-2 by FDA under an Emergency Use Authorization (EUA). This EUA will remain in effect (meaning this test can be used) for the duration of the COVID-19 declaration under Section 564(b)(1) of the Act, 21 U.S.C. section 360bbb-3(b)(1), unless the authorization is terminated or revoked.  Performed at [facility], [address] [place], [place] [address]      Labs: BNP (last 3 results) No results for input(s): BNP in the last 8760 hours. Basic Metabolic Panel: Recent Labs  Lab 04/21/20 2251 04/22/20 0013 04/22/20 0404  NA 132*  --  137  K 3.4*  --  4.8  CL 99  --  103  CO2 23  --  26  GLUCOSE 118*  --  138*  BUN 15  --  13  CREATININE 0.84  --  0.84  CALCIUM 8.6*  --  8.9  MG  --  2.1  --    Liver Function Tests: Recent Labs  Lab 04/21/20 2251 04/22/20 0404  AST 55* 59*  ALT 52* 52*  ALKPHOS 86 100  BILITOT 0.9 0.6  PROT 6.3* 6.9  ALBUMIN 3.6 3.9   No results for input(s): LIPASE, AMYLASE in the last 168 hours. Recent Labs  Lab 04/22/20 0013  AMMONIA 32   CBC: Recent Labs  Lab 04/21/20 2251 04/22/20 0404  WBC 8.4  9.5  NEUTROABS 4.3  --   HGB 15.7 16.4  HCT 43.8 45.5  MCV 94.6 94.8  PLT 175 170   Cardiac Enzymes: No results for input(s): CKTOTAL, CKMB, CKMBINDEX, TROPONINI in the last 168 hours. BNP: Invalid input(s): POCBNP CBG: Recent Labs  Lab 04/23/20 0551  GLUCAP 115*   D-Dimer No results for input(s): DDIMER in the last 72 hours. Hgb A1c No results for input(s): HGBA1C in the last 72 hours. Lipid Profile No results for input(s): CHOL, HDL, LDLCALC, TRIG, CHOLHDL, LDLDIRECT in the last 72 hours. Thyroid function studies No results for input(s): TSH, T4TOTAL, T3FREE, THYROIDAB in the last 72 hours.  Invalid input(s): FREET3 Anemia work up No results for input(s): VITAMINB12, FOLATE, FERRITIN, TIBC, IRON, RETICCTPCT in the last 72 hours. Urinalysis    Component Value  Date/Time   APPEARANCEUR Clear 03/07/2020 1609   GLUCOSEU Negative 03/07/2020 1609   BILIRUBINUR Negative 03/07/2020 1609   PROTEINUR Negative 03/07/2020 1609   NITRITE Negative 03/07/2020 1609   LEUKOCYTESUR 1+ (A) 03/07/2020 1609   Sepsis Labs Invalid input(s): PROCALCITONIN,  WBC,  LACTICIDVEN Microbiology Recent Results (from the past 240 hour(s))  Resp Panel by RT-PCR (Flu A&B, Covid) Nasopharyngeal Swab     Status: None   Collection Time: 04/22/20 12:13 AM   Specimen: Nasopharyngeal Swab; Nasopharyngeal(NP) swabs in vial transport medium  Result Value Ref Range Status   SARS Coronavirus 2 by RT PCR NEGATIVE NEGATIVE Final    Comment: (NOTE) SARS-CoV-2 target nucleic acids are NOT DETECTED.  The SARS-CoV-2 RNA is generally detectable in upper respiratory specimens during the acute phase of infection. The lowest concentration of SARS-CoV-2 viral copies this assay can detect is 138 copies/mL. A negative result does not preclude SARS-Cov-2 infection and should not be used as the sole basis for treatment or other patient management decisions. A negative result may occur with  improper specimen collection/handling, submission of specimen other than nasopharyngeal swab, presence of viral mutation(s) within the areas targeted by this assay, and inadequate number of viral copies(<138 copies/mL). A negative result must be combined with clinical observations, patient history, and epidemiological information. The expected result is Negative.  Fact Sheet for Patients:  EntrepreneurPulse.com.au  Fact Sheet for Healthcare Providers:  IncredibleEmployment.be  This test is no t yet approved or cleared by the [place] FDA and  has been authorized for detection and/or diagnosis of SARS-CoV-2 by FDA under an Emergency Use Authorization (EUA). This EUA will remain  in effect (meaning this test can be used) for the duration of the COVID-19  declaration under Section 564(b)(1) of the Act, 21 U.S.C.section 360bbb-3(b)(1), unless the authorization is terminated  or revoked sooner.       Influenza A by PCR NEGATIVE NEGATIVE Final   Influenza B by PCR NEGATIVE NEGATIVE Final    Comment: (NOTE) The Xpert Xpress SARS-CoV-2/FLU/RSV plus assay is intended as an aid in the diagnosis of influenza from Nasopharyngeal swab specimens and should not be used as a sole basis for treatment. Nasal washings and aspirates are unacceptable for Xpert Xpress SARS-CoV-2/FLU/RSV testing.  Fact Sheet for Patients: EntrepreneurPulse.com.au  Fact Sheet for Healthcare Providers: IncredibleEmployment.be  This test is not yet approved or cleared by the [place] FDA and has been authorized for detection and/or diagnosis of SARS-CoV-2 by FDA under an Emergency Use Authorization (EUA). This EUA will remain in effect (meaning this test can be used) for the duration of the COVID-19 declaration under Section 564(b)(1) of the Act, 21 U.S.C. section  360bbb-3(b)(1), unless the authorization is terminated or revoked.  Performed at [facility], [address], [place], [place] [address]      Time coordinating discharge: Over 30 minutes  SIGNED:   Sidney Ace, MD  [facility] 04/23/2020, 10:47 AM Pager   If 7PM-7AM, please contact night-coverage

## 2020-04-23 NOTE — Progress Notes (Addendum)
Progress Note  Patient Name: Matthew Webster Date of Encounter: 04/23/2020  [facility] Cardiologist: New  Subjective   Echo still has not been performed-called twice and no answer. Will follow-up.   No recurrent syncope. Tele shows SR/SB. No chest pain or sob.   Inpatient Medications    Scheduled Meds: . amLODipine  5 mg Oral Daily  . enoxaparin (LOVENOX) injection  0.5 mg/kg Subcutaneous Q24H  . losartan  50 mg Oral Daily   And  . hydrochlorothiazide  12.5 mg Oral Daily  . latanoprost  1 drop Left Eye QHS  . sodium chloride flush  3 mL Intravenous Q12H  . tamsulosin  0.4 mg Oral Daily  . timolol  1 drop Left Eye Daily  . venlafaxine XR  150 mg Oral Q2200   Continuous Infusions:  PRN Meds: acetaminophen **OR** acetaminophen, alum & mag hydroxide-simeth, magnesium hydroxide, ondansetron **OR** ondansetron (ZOFRAN) IV, traZODone   Vital Signs    Vitals:   04/23/20 0058 04/23/20 0500 04/23/20 0549 04/23/20 0813  BP: (!) 141/87  122/89 (!) 132/98  Pulse: (!) 58  70 (!) 58  Resp: 18  16 20   Temp: 98.8 F (37.1 C)  97.8 F (36.6 C) 98.2 F (36.8 C)  TempSrc: Oral  Oral Oral  SpO2: 94%  93% 90%  Weight:  93 kg    Height:        Intake/Output Summary (Last 24 hours) at 04/23/2020 0842 Last data filed at 04/22/2020 1830 Gross per 24 hour  Intake 120 ml  Output 500 ml  Net -380 ml   Last 3 Weights 04/23/2020 04/21/2020 03/07/2020  Weight (lbs) 205 lb 0.4 oz 206 lb 2.1 oz 190 lb  Weight (kg) 93 kg 93.5 kg 86.183 kg      Telemetry     SR/SB, Hr 50-60s- Personally Reviewed  ECG    No new tracing - Personally Reviewed  Physical Exam   GEN: No acute distress.   Neck: No JVD Cardiac: RRR, no murmurs, rubs, or gallops.  Respiratory: Clear to auscultation bilaterally. GI: Soft, nontender, non-distended  MS: No edema; No deformity. Neuro:  Nonfocal  Psych: Normal affect   Labs    High Sensitivity Troponin:   Recent Labs  Lab 04/21/20 2251  04/22/20 0404  TROPONINIHS 10 12      Chemistry Recent Labs  Lab 04/21/20 2251 04/22/20 0404  NA 132* 137  K 3.4* 4.8  CL 99 103  CO2 23 26  GLUCOSE 118* 138*  BUN 15 13  CREATININE 0.84 0.84  CALCIUM 8.6* 8.9  PROT 6.3* 6.9  ALBUMIN 3.6 3.9  AST 55* 59*  ALT 52* 52*  ALKPHOS 86 100  BILITOT 0.9 0.6  GFRNONAA >60 >60  ANIONGAP 10 8     Hematology Recent Labs  Lab 04/21/20 2251 04/22/20 0404  WBC 8.4 9.5  RBC 4.63 4.80  HGB 15.7 16.4  HCT 43.8 45.5  MCV 94.6 94.8  MCH 33.9 34.2*  MCHC 35.8 36.0  RDW 12.7 12.6  PLT 175 170    BNPNo results for input(s): BNP, PROBNP in the last 168 hours.   DDimer No results for input(s): DDIMER in the last 168 hours.   Radiology    CT Head Wo Contrast  Result Date: 04/21/2020 CLINICAL DATA:  Recent syncopal episode EXAM: CT HEAD WITHOUT CONTRAST CT CERVICAL SPINE WITHOUT CONTRAST TECHNIQUE: Multidetector CT imaging of the head and cervical spine was performed following the standard protocol without intravenous contrast. Multiplanar  CT image reconstructions of the cervical spine were also generated. COMPARISON:  None. FINDINGS: CT HEAD FINDINGS Brain: No evidence of acute infarction, hemorrhage, hydrocephalus, extra-axial collection or mass lesion/mass effect. Vascular: No hyperdense vessel or unexpected calcification. Skull: Normal. Negative for fracture or focal lesion. Sinuses/Orbits: Mild mucosal thickening is noted within the ethmoid and maxillary sinuses. Other: None. CT CERVICAL SPINE FINDINGS Alignment: Within normal limits. Skull base and vertebrae: 7 cervical segments are well visualized. Vertebral body height is well maintained. Osteophytic changes are noted throughout the cervical spine. Multilevel facet hypertrophic changes are noted as well. No acute fracture or acute facet abnormality is noted. Multilevel neural foraminal narrowing is seen. Soft tissues and spinal canal: Surrounding soft tissue structures show no acute  abnormality. Upper chest: Visualized lung apices are unremarkable. Other: None IMPRESSION: CT of the head: No acute intracranial abnormality noted. Mild mucosal thickening in the paranasal sinuses. CT of the cervical spine: Multilevel degenerative changes without acute abnormality. Electronically Signed   By: Inez Catalina M.D.   On: 04/21/2020 23:57   CT Cervical Spine Wo Contrast  Result Date: 04/21/2020 CLINICAL DATA:  Recent syncopal episode EXAM: CT HEAD WITHOUT CONTRAST CT CERVICAL SPINE WITHOUT CONTRAST TECHNIQUE: Multidetector CT imaging of the head and cervical spine was performed following the standard protocol without intravenous contrast. Multiplanar CT image reconstructions of the cervical spine were also generated. COMPARISON:  None. FINDINGS: CT HEAD FINDINGS Brain: No evidence of acute infarction, hemorrhage, hydrocephalus, extra-axial collection or mass lesion/mass effect. Vascular: No hyperdense vessel or unexpected calcification. Skull: Normal. Negative for fracture or focal lesion. Sinuses/Orbits: Mild mucosal thickening is noted within the ethmoid and maxillary sinuses. Other: None. CT CERVICAL SPINE FINDINGS Alignment: Within normal limits. Skull base and vertebrae: 7 cervical segments are well visualized. Vertebral body height is well maintained. Osteophytic changes are noted throughout the cervical spine. Multilevel facet hypertrophic changes are noted as well. No acute fracture or acute facet abnormality is noted. Multilevel neural foraminal narrowing is seen. Soft tissues and spinal canal: Surrounding soft tissue structures show no acute abnormality. Upper chest: Visualized lung apices are unremarkable. Other: None IMPRESSION: CT of the head: No acute intracranial abnormality noted. Mild mucosal thickening in the paranasal sinuses. CT of the cervical spine: Multilevel degenerative changes without acute abnormality. Electronically Signed   By: Inez Catalina M.D.   On: 04/21/2020 23:57    US Carotid Bilateral  Result Date: 04/22/2020 CLINICAL DATA:  Recent syncopal episode EXAM: BILATERAL CAROTID DUPLEX ULTRASOUND TECHNIQUE: Pearline Cables scale imaging, color Doppler and duplex ultrasound were performed of bilateral carotid and vertebral arteries in the neck. COMPARISON:  None FINDINGS: Criteria: Quantification of carotid stenosis is based on velocity parameters that correlate the residual internal carotid diameter with NASCET-based stenosis levels, using the diameter of the distal internal carotid lumen as the denominator for stenosis measurement. The following velocity measurements were obtained: RIGHT ICA: 76/14 cm/sec CCA: 81/19 cm/sec SYSTOLIC ICA/CCA RATIO:  0.9 ECA: 101 cm/sec LEFT ICA: 80/31 cm/sec CCA: 14/78 cm/sec SYSTOLIC ICA/CCA RATIO:  0.9 ECA: 136 cm/sec RIGHT CAROTID ARTERY: Evaluation of preliminary grayscale images show no significant atherosclerotic plaque. Evaluation the waveforms, velocities and flow velocity ratios show no evidence of focal hemodynamically significant stenosis. RIGHT VERTEBRAL ARTERY:  Antegrade in nature. LEFT CAROTID ARTERY: Evaluation preliminary grayscale images demonstrate no significant atherosclerotic plaque. The waveforms, velocities and flow velocity ratios show no evidence of focal hemodynamically significant stenosis. LEFT VERTEBRAL ARTERY:  Antegrade in nature. IMPRESSION: No evidence of hemodynamically significant stenosis  in the carotid arteries. Electronically Signed   By: Inez Catalina M.D.   On: 04/22/2020 01:11   DG Hand Complete Right  Result Date: 04/21/2020 CLINICAL DATA:  Thumb pain after fall. EXAM: RIGHT HAND - COMPLETE 3+ VIEW COMPARISON:  Left hand radiograph August 04, 2017. FINDINGS: There is no evidence of fracture or dislocation. Mild degenerative change of the interphalangeal joints. Soft tissues are unremarkable. IMPRESSION: No fracture or dislocation of the right hand. Electronically Signed   By: Dahlia Bailiff MD   On: 04/21/2020  23:43    Cardiac Studies   Echo ordered  Patient Profile     69 y.o. male with a hx of hypertension, BPH, depression, alcohol use, fatty liver who is being seen today for the evaluation of syncope.  Assessment & Plan    Syncope -Reported sudden LOC with no symptoms prior to episode. In the setting of alcohol intake and coughing fit -Labs showed alcohol level 110, troponin negative, LFTs mildly elevated.  Otherwise labs were unremarkable -Imaging nonacute -EKG/tele unrevealing -Orthostatics wnl -Echo ordered- not yet performed - Possibly vasovagal although he had an earlier episode that would not suggest this. Likely alcohol contributing factor -Would consider heart monitor at discharge with cardiology follow-up.   Alcohol abuse -Cessation/decreased intake discussed  Hypertension -PTA amlodipine 5 mg daily, losartan hydrochlorothiazide 50-12.5 mg daily -Pressures reasonable  Hypokalemia -Supplement as to goal greater than 4  For questions or updates, please contact [place] Please consult [email or web address] for contact info under        Signed, Cadence Ninfa Meeker, PA-C  04/23/2020, 8:42 AM     Attending Note:   The patient was seen and examined.  Agree with assessment and plan as noted above.  Changes made to the above note as needed.  Patient seen and independently examined with  Cadence Kathlen Mody, PA .   We discussed all aspects of the encounter. I agree with the assessment and plan as stated above.  1.   Syncope:   Very poor history of events surrounding the episodes.   Some times seem to involve viagra,   Each episode has been after heavy alcohol use.   The last episode occurred just after he choked on some broth / soup.  He has no concerning pauses or tachycardia on tele.  We will arrange for him to have an event monitor and and echo as OP. We will ensure that he does not need a pacer but I suspect his syncope is due to behavioral issues and so he will need to  change these in order to prevent him from passing out.   I have spent a total of 40 minutes with patient reviewing hospital  notes , telemetry, EKGs, labs and examining patient as well as establishing an assessment and plan that was discussed with the patient. > 50% of time was spent in direct patient care.  [facility] will sign off.   Medication Recommendations:  Avoid heavy alcohol use,  Avoid viagra if it is causing syncope Other recommendations (labs, testing, etc):   Event monitor and echo as op Follow up as an outpatient:  [facility] in [place] .     Thayer Headings, Brooke Bonito., MD, [facility] 04/23/2020, 10:09 AM [address],  [place] Pager [phone]

## 2020-04-24 ENCOUNTER — Telehealth: Payer: Self-pay

## 2020-04-24 NOTE — Telephone Encounter (Signed)
Scheduled with patient :  3/29 zio placement  4/12 echo  4/29 1 m fu  [facility]

## 2020-04-24 NOTE — Telephone Encounter (Signed)
-----   Message from [place], PA-C sent at 04/23/2020  9:54 AM EDT ----- Regarding: hospital follow-up with monitor Patient seen for syncope. He was discharged without echo since there was no tech. He needs an echo and heart monitor for 14 days. Orders were placed. Please call and arrange. Also needs follow-up in 1 month. He is a new patient whoever has openings first is fine.Thanks!

## 2020-04-24 NOTE — Telephone Encounter (Signed)
Please call this patient tot schedule an echo, 14 days zio and follow up appt. Cadence has placed the orders.

## 2020-04-25 ENCOUNTER — Ambulatory Visit (INDEPENDENT_AMBULATORY_CARE_PROVIDER_SITE_OTHER): Payer: Medicare Other

## 2020-04-25 DIAGNOSIS — R55 Syncope and collapse: Secondary | ICD-10-CM | POA: Diagnosis not present

## 2020-05-09 ENCOUNTER — Ambulatory Visit
Admission: RE | Admit: 2020-05-09 | Discharge: 2020-05-09 | Disposition: A | Discharge status: Home / Self Care | Payer: Medicare Other | Source: Ambulatory Visit | Attending: Medical | Admitting: Medical

## 2020-05-09 ENCOUNTER — Other Ambulatory Visit: Payer: Self-pay

## 2020-05-09 DIAGNOSIS — R55 Syncope and collapse: Secondary | ICD-10-CM | POA: Diagnosis not present

## 2020-05-09 DIAGNOSIS — I10 Essential (primary) hypertension: Secondary | ICD-10-CM | POA: Diagnosis not present

## 2020-05-09 LAB — ECHOCARDIOGRAM COMPLETE
AR max vel: 3.3 cm2
AV Area VTI: 3.58 cm2
AV Area mean vel: 3.25 cm2
AV Mean grad: 2 mmHg
AV Peak grad: 4.2 mmHg
Ao pk vel: 1.03 m/s
Area-P 1/2: 3.81 cm2
MV VTI: 2.61 cm2
S' Lateral: 3.4 cm

## 2020-05-09 NOTE — Progress Notes (Signed)
*  PRELIMINARY RESULTS* Echocardiogram 2D Echocardiogram has been performed.  Matthew Webster 05/09/2020, 11:39 AM

## 2020-05-16 ENCOUNTER — Telehealth: Payer: Self-pay

## 2020-05-16 NOTE — Telephone Encounter (Signed)
Was able to contact pt regarding his recent echocardiogram all questions and concerns were answer, no further testing or med changes at this time, pt grateful for call, nothing further at this time. Pt verified appt with Dr. Garen Lah on 4/29.

## 2020-05-26 ENCOUNTER — Other Ambulatory Visit: Payer: Self-pay

## 2020-05-26 ENCOUNTER — Encounter: Payer: Self-pay | Admitting: Cardiology

## 2020-05-26 ENCOUNTER — Ambulatory Visit: Payer: Medicare Other | Admitting: Cardiology

## 2020-05-26 VITALS — BP 122/68 | HR 77 | Ht 66.0 in | Wt 196.0 lb

## 2020-05-26 DIAGNOSIS — I10 Essential (primary) hypertension: Secondary | ICD-10-CM | POA: Diagnosis not present

## 2020-05-26 DIAGNOSIS — R55 Syncope and collapse: Secondary | ICD-10-CM

## 2020-05-26 NOTE — Progress Notes (Signed)
Cardiology Office Note:    Date:  05/26/2020   ID:  Matthew Webster, DOB [birth year], MRN [id number]  PCP:  Juluis Pitch, [place]  Cardiologist:  Kate Sable, MD  Advanced Practice Provider:  No care team member to display Electrophysiologist:  None       Referring MD: Juluis Pitch, MD   Chief Complaint  Patient presents with  . New Patient (Initial Visit)    Hospital follow up - Syncope. Meds reviewed verbally with patient.     History of Present Illness:    Matthew Webster is a 70 y.o. male with a hx of hypertension, fatty liver disease, alcohol abuse who presents for follow-up.    Patient recently seen in the emergency room after an episode of loss of consciousness.  Patient was at home when this happened.  He was drinking soup that evening, that went the wrong way and developed a coughing spell up to the point where he became out of breath.  Does not remember much after that, woke up from the floor unsure how long.  Had a lip laceration.  Denies prodromal symptoms, denies chest pain, shortness of breath, palpitations, dizziness.  He called EMS and was taken to the ED  Work-up in the ED where unrevealing.  EKG sinus rhythm with no significant arrhythmias.  2-week cardiac monitor was placed showing rare SVTs, no other arrhythmias to suggest etiology of syncope. Echocardiogram obtained 05/09/2020 showed preserved ejection 50 to 55%, mild dilatation of the aortic trivial MR.  Had a previous incident 5 days before when he bent over to tie his dog to a leash.  Does not remember much after, remember his wife waking him up from the floor.  He exercises on his Pel[employer] bike for 30 minutes daily.  Drinks about 6 beers every evening.  Endorses not drinking enough water as he should.  Past Medical History:  Diagnosis Date  . Actinic keratosis   . Anxiety   . Benign non-nodular prostatic hyperplasia with lower urinary tract symptoms   . Cancer of  sigmoid colon ([place]) 06/20/2014  . Depression 11/24/2013   Overview:  Questionable bipolar  . Diverticulosis   . Fatty liver    on CT  . Hemorrhoids   . History of chicken pox   . Hx of basal cell carcinoma 12/24/2016   Left anterior ear helix  . Hx of basal cell carcinoma 06/24/2017   Left upper forehead  . Hx of basal cell carcinoma 08/14/2017   Right upper forehead  . Hypertension 11/24/2013  . Personal history of colonic polyps 02/24/2014    Past Surgical History:  Procedure Laterality Date  . CATARACT EXTRACTION W/ INTRAOCULAR LENS IMPLANT Left 2011  . CATARACT EXTRACTION W/PHACO Right 05/26/2019   Procedure: CATARACT EXTRACTION PHACO AND INTRAOCULAR LENS PLACEMENT (IOC) RIGHT 10.64  01:10.2  15.1%;  Surgeon: Leandrew Koyanagi, MD;  Location: [facility];  Service: Ophthalmology;  Laterality: Right;  . COLON SURGERY    . COLONOSCOPY WITH PROPOFOL N/A 07/22/2017   Procedure: COLONOSCOPY WITH PROPOFOL;  Surgeon: Lollie Sails, MD;  Location: [facility] ENDOSCOPY;  Service: Endoscopy;  Laterality: N/A;  . COLONOSCOPY WITH PROPOFOL N/A 07/21/2019   Procedure: COLONOSCOPY WITH PROPOFOL;  Surgeon: Toledo, Benay Pike, MD;  Location: [facility] ENDOSCOPY;  Service: Gastroenterology;  Laterality: N/A;  . DIAGNOSTIC LAPAROSCOPY    . ESOPHAGOGASTRODUODENOSCOPY N/A 07/21/2019   Procedure: ESOPHAGOGASTRODUODENOSCOPY (EGD);  Surgeon: Toledo, Benay Pike, MD;  Location: [facility] ENDOSCOPY;  Service: Gastroenterology;  Laterality: N/A;  . EYE SURGERY     DETACHED RETINA  . LAPAROSCOPIC BOWEL RESECTION    . RETINAL DETACHMENT SURGERY    . TONSILLECTOMY    . WISDOM TOOTH EXTRACTION      Current Medications: Current Meds  Medication Sig  . latanoprost (XALATAN) 0.005 % ophthalmic solution Place 1 drop into both eyes at bedtime.  Marland Kitchen losartan-hydrochlorothiazide (HYZAAR) 50-12.5 MG tablet Take 1 tablet by mouth daily.  . tamsulosin (FLOMAX) 0.4 MG CAPS capsule TAKE 1 CAPSULE(0.4 MG) BY MOUTH DAILY   . timolol (TIMOPTIC) 0.5 % ophthalmic solution instill 1 drop into left eye every morning  . venlafaxine XR (EFFEXOR-XR) 150 MG 24 hr capsule Take 150 mg by mouth daily with breakfast.     Allergies:   Alphagan p [brimonidine tartrate], Codeine, Shellfish allergy, Penicillin v potassium, and Penicillins   Social History   Socioeconomic History  . Marital status: Married    Spouse name: Not on file  . Number of children: Not on file  . Years of education: Not on file  . Highest education level: Not on file  Occupational History  . Not on file  Tobacco Use  . Smoking status: Never Smoker  . Smokeless tobacco: Never Used  Vaping Use  . Vaping Use: Never used  Substance and Sexual Activity  . Alcohol use: Yes    Alcohol/week: 21.0 standard drinks    Types: 21 Cans of beer per week    Comment: 4  25 oz beers per night  . Drug use: No  . Sexual activity: Not on file  Other Topics Concern  . Not on file  Social History Narrative  . Not on file   Social Determinants of Health   Financial Resource Strain: Not on file  Food Insecurity: Not on file  Transportation Needs: Not on file  Physical Activity: Not on file  Stress: Not on file  Social Connections: Not on file     Family History: The patient's family history includes CVA in his mother; Diabetes in his father. There is no history of Prostate cancer, Bladder Cancer, or Kidney cancer.  ROS:   Please see the history of present illness.     All other systems reviewed and are negative.  EKGs/Labs/Other Studies Reviewed:    The following studies were reviewed today:   EKG:  EKG is  ordered today.  The ekg ordered today demonstrates sinus rhythm, normal ECG  Recent Labs: 04/22/2020: ALT 52; BUN 13; Creatinine, Ser 0.84; Hemoglobin 16.4; Magnesium 2.1; Platelets 170; Potassium 4.8; Sodium 137  Recent Lipid Panel No results found for: CHOL, TRIG, HDL, CHOLHDL, VLDL, LDLCALC, LDLDIRECT   Risk Assessment/Calculations:       Physical Exam:    VS:  BP 122/68 (BP Location: Left Arm, Patient Position: Sitting, Cuff Size: Normal)   Pulse 77   Ht 5\' 6"  (1.676 m)   Wt 196 lb (88.9 kg)   SpO2 95%   BMI 31.64 kg/m     Wt Readings from Last 3 Encounters:  05/26/20 196 lb (88.9 kg)  04/23/20 205 lb 0.4 oz (93 kg)  03/07/20 190 lb (86.2 kg)     GEN:  Well nourished, well developed in no acute distress HEENT: Normal NECK: No JVD; No carotid bruits LYMPHATICS: No lymphadenopathy CARDIAC: RRR, no murmurs, rubs, gallops RESPIRATORY:  Clear to auscultation without rales, wheezing or rhonchi  ABDOMEN: Soft, non-tender, non-distended MUSCULOSKELETAL:  No edema; No deformity  SKIN: Warm and  dry NEUROLOGIC:  Alert and oriented x 3 PSYCHIATRIC:  Normal affect   ASSESSMENT:    1. Syncope and collapse   2. Primary hypertension    PLAN:    In order of problems listed above:  1. Syncopal episode, etiology appears vasovagal especially with coughing spell, cardiac monitor and echo unrevealing.  Alcohol cessation advised.  appropriate hydration recommended.  Orthostatic vitals with no evidence of orthostasis.  Thankfully, patient has not had any further episodes. 2. Hypertension, BP controlled.  Continue losartan, HCTZ.  Follow-up in 6 months.     Medication Adjustments/Labs and Tests Ordered: Current medicines are reviewed at length with the patient today.  Concerns regarding medicines are outlined above.  Orders Placed This Encounter  Procedures  . EKG 12-Lead   No orders of the defined types were placed in this encounter.   Patient Instructions  Medication Instructions:  Your physician recommends that you continue on your current medications as directed. Please refer to the Current Medication list given to you today.  *If you need a refill on your cardiac medications before your next appointment, please call your pharmacy*   Lab Work: None ordered If you have labs (blood work) drawn today and  your tests are completely normal, you will receive your results only by: Marland Kitchen MyChart Message (if you have MyChart) OR . A paper copy in the mail If you have any lab test that is abnormal or we need to change your treatment, we will call you to review the results.   Testing/Procedures: None ordered   Follow-Up: At [facility], you and your health needs are our priority.  As part of our continuing mission to provide you with exceptional heart care, we have created designated Provider Care Teams.  These Care Teams include your primary Cardiologist (physician) and Advanced Practice Providers (APPs -  Physician Assistants and Nurse Practitioners) who all work together to provide you with the care you need, when you need it.  We recommend signing up for the patient portal called "MyChart".  Sign up information is provided on this After Visit Summary.  MyChart is used to connect with patients for Virtual Visits (Telemedicine).  Patients are able to view lab/test results, encounter notes, upcoming appointments, etc.  Non-urgent messages can be sent to your provider as well.   To learn more about what you can do with MyChart, go to [email or web address].    Your next appointment:   6 month(s)  The format for your next appointment:   In Person  Provider:   Kate Sable, MD   Other Instructions      Signed, Kate Sable, MD  05/26/2020 4:13 PM    [place]

## 2020-05-26 NOTE — Patient Instructions (Signed)

## 2020-10-11 ENCOUNTER — Ambulatory Visit: Payer: Medicare Other | Attending: Student

## 2020-10-11 DIAGNOSIS — M542 Cervicalgia: Secondary | ICD-10-CM

## 2020-10-11 DIAGNOSIS — M25512 Pain in left shoulder: Secondary | ICD-10-CM | POA: Diagnosis present

## 2020-10-11 NOTE — Therapy (Signed)
[facility] [address], [place], [address] Phone: [phone]   Fax:  [phone]  Physical Therapy Evaluation  Patient Details  Name: Matthew Webster MRN: [id number] Date of Birth: 10-27-1950 Referring Provider (PT): Wayland Denis, [place]  Encounter Date: 10/11/2020   PT End of Session - 10/11/20 1502     Visit Number 1    Number of Visits 17    Date for PT Re-Evaluation 12/07/20    Authorization Type 1    Authorization Time Period 10    PT Start Time 1502    PT Stop Time 1609    PT Time Calculation (min) 67 min    Activity Tolerance Patient tolerated treatment well    Behavior During Therapy [facility] for tasks assessed/performed             Past Medical History:  Diagnosis Date   Actinic keratosis    Anxiety    Benign non-nodular prostatic hyperplasia with lower urinary tract symptoms    Cancer of sigmoid colon ([place]) 06/20/2014   Depression 11/24/2013   Overview:  Questionable bipolar   Diverticulosis    Fatty liver    on CT   Hemorrhoids    History of chicken pox    Hx of basal cell carcinoma 12/24/2016   Left anterior ear helix   Hx of basal cell carcinoma 06/24/2017   Left upper forehead   Hx of basal cell carcinoma 08/14/2017   Right upper forehead   Hypertension 11/24/2013   Personal history of colonic polyps 02/24/2014    Past Surgical History:  Procedure Laterality Date   CATARACT EXTRACTION W/ INTRAOCULAR LENS IMPLANT Left 2011   CATARACT EXTRACTION W/PHACO Right 05/26/2019   Procedure: CATARACT EXTRACTION PHACO AND INTRAOCULAR LENS PLACEMENT (IOC) RIGHT 10.64  01:10.2  15.1%;  Surgeon: Leandrew Koyanagi, MD;  Location: [place];  Service: Ophthalmology;  Laterality: Right;   COLON SURGERY     COLONOSCOPY WITH PROPOFOL N/A 07/22/2017   Procedure: COLONOSCOPY WITH PROPOFOL;  Surgeon: Lollie Sails, MD;  Location: [facility] ENDOSCOPY;  Service: Endoscopy;  Laterality:  N/A;   COLONOSCOPY WITH PROPOFOL N/A 07/21/2019   Procedure: COLONOSCOPY WITH PROPOFOL;  Surgeon: Toledo, Benay Pike, MD;  Location: [facility] ENDOSCOPY;  Service: Gastroenterology;  Laterality: N/A;   DIAGNOSTIC LAPAROSCOPY     ESOPHAGOGASTRODUODENOSCOPY N/A 07/21/2019   Procedure: ESOPHAGOGASTRODUODENOSCOPY (EGD);  Surgeon: Toledo, Benay Pike, MD;  Location: [facility] ENDOSCOPY;  Service: Gastroenterology;  Laterality: N/A;   EYE SURGERY     DETACHED RETINA   LAPAROSCOPIC BOWEL RESECTION     RETINAL DETACHMENT SURGERY     TONSILLECTOMY     WISDOM TOOTH EXTRACTION      There were no vitals filed for this visit.    Subjective Assessment - 10/11/20 1506     Subjective Neck: 2/10 currently (today is a good day), 7/10 at worst for the past 3 months. Has B ulnar nerve symptoms to 5th digit which started about 1 week ago.    Pertinent History Cervicalgia. R shoulder feels better than the L. L arm feels week. Feels like a pinch in L shoulder. 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 Started feeling L anterior shoulder joint pain yesterday. Pt is R hand dominant. L UE used to be as strong as R UE. L UE now feels more weak. Turning his head to the R and L as well as looking down bothers his neck. Neck is not really bothering him today but turning a  certain way can bother him. L upper trap and lateral neck pain > R side. Had chiropractor and did not get better. PA prescribed muscle relaxer and anti inflammatory medicine which did not help. Had x-rays which revealed arthritis and scoliosis in neck. Neck pain became noticeable about 6 months ago. 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    Patient Stated Goals Decrease pain.    Currently in Pain? Yes    Pain Score 2     Pain Location Neck    Pain Orientation Left    Pain Type Chronic pain    Pain Onset More than a month ago    Pain Frequency Occasional    Aggravating Factors  Neck: cervical rotation R and L, looking down: L shoulder raising his L  arm    Pain Relieving Factors Aspirin                [facility] PT Assessment - 10/11/20 1504       Assessment   Medical Diagnosis M50.30 (ICD-10-CM) - Other cervical disc degeneration, unspecified cervical region  M54.2 (ICD-10-CM) - Cervicalgia  G89.29 (ICD-10-CM) - Other chronic pain    Referring Provider (PT) Wayland Denis, PA-C    Onset Date/Surgical Date 06/29/20      Precautions   Precaution Comments hx of CA      Restrictions   Other Position/Activity Restrictions No known restrictions      Balance Screen   Has the patient fallen in the past 6 months No    Has the patient had a decrease in activity level because of a fear of falling?  No    Is the patient reluctant to leave their home because of a fear of falling?  No      Observation/Other Assessments   Focus on Therapeutic Outcomes (FOTO)  Cervical FOTO 52      Posture/Postural Control   Posture Comments B protracted shoulders, slight kyphosis, L shoulder lower      AROM   Left Shoulder Flexion 143 Degrees   with subacromial pain   Left Shoulder ABduction 153 Degrees    Cervical Flexion full with R clavicular tingling    Cervical Extension limited, movement preference around C5/C6 area    Cervical - Right Side Bend limited    Cervical - Left Side Bend limited    Cervical - Right Rotation 55   neck feels tight   Cervical - Left Rotation 55   neck feels tight; L upper trap tightness     Strength   Right Shoulder Flexion 4+/5    Right Shoulder ABduction 4+/5    Left Shoulder Flexion 4+/5    Left Shoulder ABduction 4/5    Right Elbow Flexion 4+/5    Right Elbow Extension 4/5    Left Elbow Flexion 4+/5    Left Elbow Extension 4/5    Right Wrist Extension 4+/5    Left Wrist Extension 4+/5      Palpation   Palpation comment B upper trap muscle tension R > L, L cervical paraspinal muscle tension      Special Tests   Other special tests (-) Neer's impingement and Hawkin's Kennedy. (+) Yocum test for  impingement. (+) empty can test.                        Objective measurements completed on examination: See above findings.        L shoulder joint pain. No  difficulty gripping per pt.   Medbridge Access Code [email or web address]   Therapeutic exercise  Seated  B scapular retraction 10x3 with 5 second holds  Improved exercise technique, movement at target joints, use of target muscles after mod verbal, visual, tactile cues.    Manual therapy  Seats STM R upper trap muscle to decrease tension   Gentle manual pressure R upper trap with R and L cervical rotation 10x2 to promote fascial mobility    Response to treatment Cervical rotation improved to 65 degrees R and 62 degrees L  Clinical impression Pt is a 69 year old male who came to physical therapy secondary to cervicalgia. He also presents with L shoulder joint pain, poor posture, limited cervical AROM with reproduction of symptoms, positive special test suggesting L shoulder impingement, B upper trap and L cervical paraspinal muscle tension, and difficulty looking around, as well as raising his L arm up secondary to pain. Pt will benefit from skilled physical therapy services to address the aforementioned deficits.               PT Education - 10/11/20 1731     Education Details ther-ex, HEP, POC    Person(s) Educated Patient    Methods Explanation;Demonstration;Tactile cues;Verbal cues;Handout    Comprehension Returned demonstration;Verbalized understanding              PT Short Term Goals - 10/11/20 1626       PT SHORT TERM GOAL #1   Title Pt will be independent with his initial HEP to decrease pain, improve ability to look around as well as raise his L arm up more comfortably.    Baseline Pt has started his HEP (10/11/2020)    Time 3    Period Weeks    Status New    Target Date 11/02/20               PT Long Term Goals - 10/11/20 1627       PT LONG TERM GOAL #1   Title Pt will  have a decrease in neck pain to 3/10 or less at worst to promote ability to look around more comfortably.    Baseline 7/10 at worst for the past 3 months (10/11/2020)    Time 8    Period Weeks    Status New    Target Date 12/07/20      PT LONG TERM GOAL #2   Title Pt will improve R and L cervical AROM to at least 65 degrees each direction to promote ability to look around such as when driving, more comfortably.    Baseline Cervical R and L rotation AROM 55 degrees (10/11/2020)    Time 8    Period Weeks    Status New    Target Date 12/07/20      PT LONG TERM GOAL #3   Title Pt will improve L shoulder flexion AROM by at least 10 degrees to promote ability to raise his arm with less diffiulty.    Baseline L shoulder flexion AROM 143 degrees with subacromial pain (10/11/2020)    Time 8    Period Weeks    Status New    Target Date 12/07/20      PT LONG TERM GOAL #4   Title Pt will improve his neck FOTO score by at least 10 points as a demonstration of improved function.    Baseline Neck FOTO 52 (10/11/2020)    Time 8    Period Weeks  Status New    Target Date 12/07/20                    Plan - 10/11/20 1731     Clinical Impression Statement Pt is a 70 year old male who came to physical therapy secondary to cervicalgia. He also presents with L shoulder joint pain, poor posture, limited cervical AROM with reproduction of symptoms, positive special test suggesting L shoulder impingement, B upper trap and L cervical paraspinal muscle tension, and difficulty looking around, as well as raising his L arm up secondary to pain. Pt will benefit from skilled physical therapy services to address the aforementioned deficits.    Personal Factors and Comorbidities Comorbidity 3+;Age;Fitness;Time since onset of injury/illness/exacerbation    Comorbidities Hx of CA, depression, HTN    Examination-Activity Limitations Lift;Other   looking around   Stability/Clinical Decision Making  Evolving/Moderate complexity   Pain seems to be worsening based on subjective reports   Clinical Decision Making Moderate    Rehab Potential Fair    PT Frequency 2x / week    PT Duration 8 weeks    PT Treatment/Interventions Therapeutic activities;Therapeutic exercise;Neuromuscular re-education;Patient/family education;Manual techniques;Dry needling;Electrical Stimulation;Iontophoresis '4mg'$ /ml Dexamethasone    PT Next Visit Plan posture, scapular, anterior cervical, rotator cuff strengthening, manual techniques, modalities PRN    PT Home Exercise Plan Medbridge Access Code [email or web address]    Consulted and Agree with Plan of Care Patient             Patient will benefit from skilled therapeutic intervention in order to improve the following deficits and impairments:  Pain, Improper body mechanics, Postural dysfunction, Decreased range of motion  Visit Diagnosis: Cervicalgia - Plan: PT plan of care cert/re-cert  Left shoulder pain, unspecified chronicity - Plan: PT plan of care cert/re-cert     Problem List Patient Active Problem List   Diagnosis Date Noted   Recurrent syncope 04/22/2020   Enlarged prostate with urinary obstruction 06/20/2014   Cancer of sigmoid colon ([place]) 06/20/2014   Depression 11/24/2013   Hypertension 11/24/2013    Joneen Boers PT, DPT   10/11/2020, 5:48 PM  [facility] [address], [place], [address] Phone: [phone]   Fax:  [phone]  Name: ARTAN VOYTKO MRN: [id number] Date of Birth: 1950/08/03

## 2020-10-11 NOTE — Patient Instructions (Signed)
Access Code: [email or web address] URL: [email or web address] Date: 10/11/2020 Prepared by: Joneen Boers  Exercises Seated Scapular Retraction - 1 x daily - 7 x weekly - 3 sets - 10 reps - 5 seconds hold

## 2020-10-17 ENCOUNTER — Ambulatory Visit: Payer: Medicare Other

## 2020-10-17 ENCOUNTER — Telehealth: Payer: Self-pay

## 2020-10-17 NOTE — Telephone Encounter (Signed)
No show. Called pt who said that he thought his next appointment is on 10/26/20. Informed pt that there is not appointment on that date for PT. However his next session is next Tuesday 10/24/20 5:15 pm. Pt states he'll be able to make it to that session. Neck was sore the day after last session. Neck is really good today. Has also been trying to do his exercise.

## 2020-10-24 ENCOUNTER — Ambulatory Visit: Payer: Medicare Other

## 2020-10-24 DIAGNOSIS — M25512 Pain in left shoulder: Secondary | ICD-10-CM

## 2020-10-24 DIAGNOSIS — M542 Cervicalgia: Secondary | ICD-10-CM

## 2020-10-24 NOTE — Therapy (Signed)
[facility] [address] [place], [address] Phone: [phone]   Fax:  [phone]  Physical Therapy Treatment  Patient Details  Name: Matthew Webster MRN: [id number] Date of Birth: [birth year] Referring Provider (PT): Wayland Denis, [place]   Encounter Date: 10/24/2020   PT End of Session - 10/24/20 1716     Visit Number 2    Number of Visits 17    Date for PT Re-Evaluation 12/07/20    Authorization Type 2    Authorization Time Period 10    PT Start Time 1717    PT Stop Time 1802    PT Time Calculation (min) 45 min    Activity Tolerance Patient tolerated treatment well    Behavior During Therapy [facility] for tasks assessed/performed             Past Medical History:  Diagnosis Date   Actinic keratosis    Anxiety    Benign non-nodular prostatic hyperplasia with lower urinary tract symptoms    Cancer of sigmoid colon ([place]) 06/20/2014   Depression 11/24/2013   Overview:  Questionable bipolar   Diverticulosis    Fatty liver    on CT   Hemorrhoids    History of chicken pox    Hx of basal cell carcinoma 12/24/2016   Left anterior ear helix   Hx of basal cell carcinoma 06/24/2017   Left upper forehead   Hx of basal cell carcinoma 08/14/2017   Right upper forehead   Hypertension 11/24/2013   Personal history of colonic polyps 02/24/2014    Past Surgical History:  Procedure Laterality Date   CATARACT EXTRACTION W/ INTRAOCULAR LENS IMPLANT Left 2011   CATARACT EXTRACTION W/PHACO Right 05/26/2019   Procedure: CATARACT EXTRACTION PHACO AND INTRAOCULAR LENS PLACEMENT (IOC) RIGHT 10.64  01:10.2  15.1%;  Surgeon: Leandrew Koyanagi, MD;  Location: [place];  Service: Ophthalmology;  Laterality: Right;   COLON SURGERY     COLONOSCOPY WITH PROPOFOL N/A 07/22/2017   Procedure: COLONOSCOPY WITH PROPOFOL;  Surgeon: Lollie Sails, MD;  Location: [facility] ENDOSCOPY;  Service: Endoscopy;  Laterality:  N/A;   COLONOSCOPY WITH PROPOFOL N/A 07/21/2019   Procedure: COLONOSCOPY WITH PROPOFOL;  Surgeon: Toledo, Benay Pike, MD;  Location: [facility] ENDOSCOPY;  Service: Gastroenterology;  Laterality: N/A;   DIAGNOSTIC LAPAROSCOPY     ESOPHAGOGASTRODUODENOSCOPY N/A 07/21/2019   Procedure: ESOPHAGOGASTRODUODENOSCOPY (EGD);  Surgeon: Toledo, Benay Pike, MD;  Location: [facility] ENDOSCOPY;  Service: Gastroenterology;  Laterality: N/A;   EYE SURGERY     DETACHED RETINA   LAPAROSCOPIC BOWEL RESECTION     RETINAL DETACHMENT SURGERY     TONSILLECTOMY     WISDOM TOOTH EXTRACTION      There were no vitals filed for this visit.   Subjective Assessment - 10/24/20 1718     Subjective Had 2 syncopal episodes in March. Had CT scans of his head and neck and had his heart checked and could find the cause of his syncopal episodes. Neck pain began in the past 6 months. L UE feels constantly heavy. 5/10 L and R upper trap area pain.    Pertinent History Cervicalgia. R shoulder feels better than the L. L arm feels week. Feels like a pinch in L shoulder. 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 Started feeling L anterior shoulder joint pain yesterday. Pt is R hand dominant. L UE used to be as strong as R UE. L UE now feels more weak. Turning his head to the R and  L as well as looking down bothers his neck. Neck is not really bothering him today but turning a certain way can bother him. L upper trap and lateral neck pain > R side. Had chiropractor and did not get better. PA prescribed muscle relaxer and anti inflammatory medicine which did not help. Had x-rays which revealed arthritis and scoliosis in neck. Neck pain became noticeable about 6 months ago. 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    Patient Stated Goals Decrease pain.    Currently in Pain? Yes    Pain Score 5     Pain Onset More than a month ago                                        PT Education - 10/24/20 1751      Education Details ther-ex, HEP    Person(s) Educated Patient    Methods Explanation;Demonstration;Tactile cues;Verbal cues;Handout    Comprehension Returned demonstration;Verbalized understanding            Objective      L shoulder joint pain. No difficulty gripping per pt.    Medbridge Access Code [email or web address]      Manual therapy    Seats STM R and L upper trap muscle to decrease tension    Caudal glide R and L first rib grade 3 to decrease stiffness.   Neck feels better afterwards per pt.   Seated STM R and L upper trap muscles with R and L cervical rotation to decrease fascial restrictions 10x3 each direction   Therapeutic exercise   supine B scapular retraction 10x3 with 5 second holds  Supine chin tucks 10x3 with 5 second holds   Reviewed and given as part of his HEP. Pt demonstrated and verbalized understanding. Handout provided.    Improved exercise technique, movement at target joints, use of target muscles after mod verbal, visual, tactile cues.       Response to treatment Just some tightness after session, no pain per pt in neck.    Clinical impression Worked on decreasing B upper trap, scalene and fascial restrictions as well as improving B first rib mobility and anterior cervical and B scapular strength to decrease stress to lower cervical spine, B lateral neck and B anterior shoulders to promote better mechanics at his neck and shoulders and decrease pain. No neck pain reported after session, just some tightness. Pt will benefit from skilled physical therapy services to address the aforementioned deficits.       PT Short Term Goals - 10/11/20 1626       PT SHORT TERM GOAL #1   Title Pt will be independent with his initial HEP to decrease pain, improve ability to look around as well as raise his L arm up more comfortably.    Baseline Pt has started his HEP (10/11/2020)    Time 3    Period Weeks    Status New    Target Date 11/02/20                PT Long Term Goals - 10/11/20 1627       PT LONG TERM GOAL #1   Title Pt will have a decrease in neck pain to 3/10 or less at worst to promote ability to look around more comfortably.    Baseline 7/10 at worst for the past 3 months (10/11/2020)  Time 8    Period Weeks    Status New    Target Date 12/07/20      PT LONG TERM GOAL #2   Title Pt will improve R and L cervical AROM to at least 65 degrees each direction to promote ability to look around such as when driving, more comfortably.    Baseline Cervical R and L rotation AROM 55 degrees (10/11/2020)    Time 8    Period Weeks    Status New    Target Date 12/07/20      PT LONG TERM GOAL #3   Title Pt will improve L shoulder flexion AROM by at least 10 degrees to promote ability to raise his arm with less diffiulty.    Baseline L shoulder flexion AROM 143 degrees with subacromial pain (10/11/2020)    Time 8    Period Weeks    Status New    Target Date 12/07/20      PT LONG TERM GOAL #4   Title Pt will improve his neck FOTO score by at least 10 points as a demonstration of improved function.    Baseline Neck FOTO 52 (10/11/2020)    Time 8    Period Weeks    Status New    Target Date 12/07/20                   Plan - 10/24/20 1713     Clinical Impression Statement Worked on decreasing B upper trap, scalene and fascial restrictions as well as improving B first rib mobility and anterior cervical and B scapular strength to decrease stress to lower cervical spine, B lateral neck and B anterior shoulders to promote better mechanics at his neck and shoulders and decrease pain. No neck pain reported after session, just some tightness. Pt will benefit from skilled physical therapy services to address the aforementioned deficits.    Personal Factors and Comorbidities Comorbidity 3+;Age;Fitness;Time since onset of injury/illness/exacerbation    Comorbidities Hx of CA, depression, HTN    Examination-Activity Limitations  Lift;Other   looking around   Stability/Clinical Decision Making Evolving/Moderate complexity   Pain seems to be worsening based on subjective reports   Rehab Potential Fair    PT Frequency 2x / week    PT Duration 8 weeks    PT Treatment/Interventions Therapeutic activities;Therapeutic exercise;Neuromuscular re-education;Patient/family education;Manual techniques;Dry needling;Electrical Stimulation;Iontophoresis 4mg /ml Dexamethasone    PT Next Visit Plan posture, scapular, anterior cervical, rotator cuff strengthening, manual techniques, modalities PRN    PT Home Exercise Plan Medbridge Access Code [email or web address]    Consulted and Agree with Plan of Care Patient             Patient will benefit from skilled therapeutic intervention in order to improve the following deficits and impairments:  Pain, Improper body mechanics, Postural dysfunction, Decreased range of motion  Visit Diagnosis: Cervicalgia  Left shoulder pain, unspecified chronicity     Problem List Patient Active Problem List   Diagnosis Date Noted   Recurrent syncope 04/22/2020   Enlarged prostate with urinary obstruction 06/20/2014   Cancer of sigmoid colon ([place]) 06/20/2014   Depression 11/24/2013   Hypertension 11/24/2013     Joneen Boers PT, DPT   10/24/2020, 6:17 PM  [facility] [address] [place], [address] Phone: [phone]   Fax:  [phone]  Name: Matthew Webster MRN: [id number] Date of Birth: 12/26/1950

## 2020-10-24 NOTE — Patient Instructions (Signed)
Access Code: [email or web address] URL: [email or web address] Date: 10/24/2020 Prepared by: Joneen Boers  Exercises Seated Scapular Retraction - 1 x daily - 7 x weekly - 3 sets - 10 reps - 5 seconds hold Supine Cervical Retraction with Towel - 1 x daily - 7 x weekly - 3 sets - 10 reps - 5 seconds hold

## 2020-10-31 ENCOUNTER — Ambulatory Visit: Payer: Medicare Other | Attending: Student

## 2020-10-31 DIAGNOSIS — M542 Cervicalgia: Secondary | ICD-10-CM | POA: Insufficient documentation

## 2020-10-31 DIAGNOSIS — M25512 Pain in left shoulder: Secondary | ICD-10-CM | POA: Insufficient documentation

## 2020-10-31 NOTE — Therapy (Signed)
[facility] [address], [place], [address] Phone: [phone]   Fax:  [phone]  Physical Therapy Treatment  Patient Details  Name: Matthew Webster MRN: [id number] Date of Birth: February 02, 1950 Referring Provider (PT): Wayland Denis, [place]   Encounter Date: 10/31/2020   PT End of Session - 10/31/20 1415     Visit Number 3    Number of Visits 17    Date for PT Re-Evaluation 12/07/20    Authorization Type 3    Authorization Time Period 10    PT Start Time 1415    PT Stop Time 1506    PT Time Calculation (min) 51 min    Activity Tolerance Patient tolerated treatment well    Behavior During Therapy [facility] for tasks assessed/performed             Past Medical History:  Diagnosis Date   Actinic keratosis    Anxiety    Benign non-nodular prostatic hyperplasia with lower urinary tract symptoms    Cancer of sigmoid colon ([place]) 06/20/2014   Depression 11/24/2013   Overview:  Questionable bipolar   Diverticulosis    Fatty liver    on CT   Hemorrhoids    History of chicken pox    Hx of basal cell carcinoma 12/24/2016   Left anterior ear helix   Hx of basal cell carcinoma 06/24/2017   Left upper forehead   Hx of basal cell carcinoma 08/14/2017   Right upper forehead   Hypertension 11/24/2013   Personal history of colonic polyps 02/24/2014    Past Surgical History:  Procedure Laterality Date   CATARACT EXTRACTION W/ INTRAOCULAR LENS IMPLANT Left 2011   CATARACT EXTRACTION W/PHACO Right 05/26/2019   Procedure: CATARACT EXTRACTION PHACO AND INTRAOCULAR LENS PLACEMENT (IOC) RIGHT 10.64  01:10.2  15.1%;  Surgeon: Leandrew Koyanagi, MD;  Location: [facility];  Service: Ophthalmology;  Laterality: Right;   COLON SURGERY     COLONOSCOPY WITH PROPOFOL N/A 07/22/2017   Procedure: COLONOSCOPY WITH PROPOFOL;  Surgeon: Lollie Sails, MD;  Location: [facility] ENDOSCOPY;  Service: Endoscopy;  Laterality:  N/A;   COLONOSCOPY WITH PROPOFOL N/A 07/21/2019   Procedure: COLONOSCOPY WITH PROPOFOL;  Surgeon: Toledo, Benay Pike, MD;  Location: [facility] ENDOSCOPY;  Service: Gastroenterology;  Laterality: N/A;   DIAGNOSTIC LAPAROSCOPY     ESOPHAGOGASTRODUODENOSCOPY N/A 07/21/2019   Procedure: ESOPHAGOGASTRODUODENOSCOPY (EGD);  Surgeon: Toledo, Benay Pike, MD;  Location: [facility] ENDOSCOPY;  Service: Gastroenterology;  Laterality: N/A;   EYE SURGERY     DETACHED RETINA   LAPAROSCOPIC BOWEL RESECTION     RETINAL DETACHMENT SURGERY     TONSILLECTOMY     WISDOM TOOTH EXTRACTION      There were no vitals filed for this visit.   Subjective Assessment - 10/31/20 1416     Subjective Neck is a 4/10 maybe. Has beend working a lot. Seems like his work makes it feel better. Still has B upper trap tightness and L anterior joint pain.    Pertinent History Cervicalgia. R shoulder feels better than the L. L arm feels week. Feels like a pinch in L shoulder. 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 Started feeling L anterior shoulder joint pain yesterday. Pt is R hand dominant. L UE used to be as strong as R UE. L UE now feels more weak. Turning his head to the R and L as well as looking down bothers his neck. Neck is not really bothering him today but turning a certain  way can bother him. L upper trap and lateral neck pain > R side. Had chiropractor and did not get better. PA prescribed muscle relaxer and anti inflammatory medicine which did not help. Had x-rays which revealed arthritis and scoliosis in neck. Neck pain became noticeable about 6 months ago. 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    Patient Stated Goals Decrease pain.    Currently in Pain? Yes    Pain Score 4     Pain Onset More than a month ago                                        PT Education - 10/31/20 1515     Education Details ther-ex    [employer]) Educated Patient    Methods  Explanation;Demonstration;Tactile cues;Verbal cues    Comprehension Returned demonstration;Verbalized understanding           Objective       L shoulder joint pain. No difficulty gripping per pt.    Medbridge Access Code [email or web address]      Manual therapy   Seats STM R and L upper trap muscle to decrease tension    Caudal glide R and L first rib grade 3 to decrease stiffness.    Seated STM R and L upper trap muscles with R and L cervical rotation to decrease fascial restrictions 10x3 each direction    Caudal pressure to R upper trap muscle with L cervical side bend 30 seconds 3x  Caudal pressure to L upper trap muscle with R cervical side bend 30 seconds 3x   Seated STM L pectoralis muscle to decrease tension    Decreased muscle tension per pt     Therapeutic exercise   Seated B scapular retraction with chin tucks 10x2 with 5 second holds  Standing B shoulder ER yellow band 10x3  Seated manually resisted L triceps extension isometrics 10x3 with 5 second holds   Standing self L triceps extension isometrics 10x2 with 5 second holds  Decreased crepitus with L shoulder flexion AROM     Improved exercise technique, movement at target joints, use of target muscles after mod verbal, visual, tactile cues.          Response to treatment No neck or shoulder pain after session. Just crepitus L shoulder joint with flexion AROM at subacromial joint.      Clinical impression Decreased starting neck pain compared to previous session. Decreased neck pain with manual therapy to decrease scalene, upper trap muscle tension as well as fascial restrictions. Decreased L shoulder pain with flexion AROM with treatment to promote posterior glide of humeral head. No neck and shoulder pain reported after session, just anterior shoulder crepitus with shoulder flexion. Pt will benefit from skilled physical therapy services to address the aforementioned deficits.         PT Short Term  Goals - 10/11/20 1626       PT SHORT TERM GOAL #1   Title Pt will be independent with his initial HEP to decrease pain, improve ability to look around as well as raise his L arm up more comfortably.    Baseline Pt has started his HEP (10/11/2020)    Time 3    Period Weeks    Status New    Target Date 11/02/20  PT Long Term Goals - 10/11/20 1627       PT LONG TERM GOAL #1   Title Pt will have a decrease in neck pain to 3/10 or less at worst to promote ability to look around more comfortably.    Baseline 7/10 at worst for the past 3 months (10/11/2020)    Time 8    Period Weeks    Status New    Target Date 12/07/20      PT LONG TERM GOAL #2   Title Pt will improve R and L cervical AROM to at least 65 degrees each direction to promote ability to look around such as when driving, more comfortably.    Baseline Cervical R and L rotation AROM 55 degrees (10/11/2020)    Time 8    Period Weeks    Status New    Target Date 12/07/20      PT LONG TERM GOAL #3   Title Pt will improve L shoulder flexion AROM by at least 10 degrees to promote ability to raise his arm with less diffiulty.    Baseline L shoulder flexion AROM 143 degrees with subacromial pain (10/11/2020)    Time 8    Period Weeks    Status New    Target Date 12/07/20      PT LONG TERM GOAL #4   Title Pt will improve his neck FOTO score by at least 10 points as a demonstration of improved function.    Baseline Neck FOTO 52 (10/11/2020)    Time 8    Period Weeks    Status New    Target Date 12/07/20                   Plan - 10/31/20 1516     Clinical Impression Statement Decreased starting neck pain compared to previous session. Decreased neck pain with manual therapy to decrease scalene, upper trap muscle tension as well as fascial restrictions. Decreased L shoulder pain with flexion AROM with treatment to promote posterior glide of humeral head. No neck and shoulder pain reported after  session, just anterior shoulder crepitus with shoulder flexion. Pt will benefit from skilled physical therapy services to address the aforementioned deficits.    Personal Factors and Comorbidities Comorbidity 3+;Age;Fitness;Time since onset of injury/illness/exacerbation    Comorbidities Hx of CA, depression, HTN    Examination-Activity Limitations Lift;Other   looking around   Stability/Clinical Decision Making Stable/Uncomplicated   Pain seems to be worsening based on subjective reports   Clinical Decision Making Low    Rehab Potential Fair    PT Frequency 2x / week    PT Duration 8 weeks    PT Treatment/Interventions Therapeutic activities;Therapeutic exercise;Neuromuscular re-education;Patient/family education;Manual techniques;Dry needling;Electrical Stimulation;Iontophoresis 4mg /ml Dexamethasone    PT Next Visit Plan posture, scapular, anterior cervical, rotator cuff strengthening, manual techniques, modalities PRN    PT Home Exercise Plan Medbridge Access Code [email or web address]    Consulted and Agree with Plan of Care Patient             Patient will benefit from skilled therapeutic intervention in order to improve the following deficits and impairments:  Pain, Improper body mechanics, Postural dysfunction, Decreased range of motion  Visit Diagnosis: Cervicalgia  Left shoulder pain, unspecified chronicity     Problem List Patient Active Problem List   Diagnosis Date Noted   Recurrent syncope 04/22/2020   Enlarged prostate with urinary obstruction 06/20/2014   Cancer of sigmoid colon ([place]) 06/20/2014   Depression  11/24/2013   Hypertension 11/24/2013    Joneen Boers PT, DPT   10/31/2020, 3:21 PM  [facility] [address] [place], [address] Phone: [phone]   Fax:  [phone]  Name: NORWIN ALEMAN MRN: [id number] Date of Birth: [birth year]

## 2020-11-02 ENCOUNTER — Ambulatory Visit: Payer: Medicare Other

## 2020-11-07 ENCOUNTER — Ambulatory Visit: Payer: Medicare Other

## 2020-11-07 DIAGNOSIS — M25512 Pain in left shoulder: Secondary | ICD-10-CM

## 2020-11-07 DIAGNOSIS — M542 Cervicalgia: Secondary | ICD-10-CM

## 2020-11-07 NOTE — Therapy (Signed)
[facility] [address], [place], [address] Phone: [phone]   Fax:  [phone]  Physical Therapy Treatment  Patient Details  Name: Matthew Webster MRN: [id number] Date of Birth: [birth year] Referring Provider (PT): Wayland Denis, [place]   Encounter Date: 11/07/2020   PT End of Session - 11/07/20 1505     Visit Number 4    Number of Visits 17    Date for PT Re-Evaluation 12/07/20    Authorization Type 4    Authorization Time Period 10    PT Start Time 0175    PT Stop Time 1545    PT Time Calculation (min) 40 min    Activity Tolerance Patient tolerated treatment well    Behavior During Therapy [facility] for tasks assessed/performed             Past Medical History:  Diagnosis Date   Actinic keratosis    Anxiety    Benign non-nodular prostatic hyperplasia with lower urinary tract symptoms    Cancer of sigmoid colon ([place]) 06/20/2014   Depression 11/24/2013   Overview:  Questionable bipolar   Diverticulosis    Fatty liver    on CT   Hemorrhoids    History of chicken pox    Hx of basal cell carcinoma 12/24/2016   Left anterior ear helix   Hx of basal cell carcinoma 06/24/2017   Left upper forehead   Hx of basal cell carcinoma 08/14/2017   Right upper forehead   Hypertension 11/24/2013   Personal history of colonic polyps 02/24/2014    Past Surgical History:  Procedure Laterality Date   CATARACT EXTRACTION W/ INTRAOCULAR LENS IMPLANT Left 2011   CATARACT EXTRACTION W/PHACO Right 05/26/2019   Procedure: CATARACT EXTRACTION PHACO AND INTRAOCULAR LENS PLACEMENT (IOC) RIGHT 10.64  01:10.2  15.1%;  Surgeon: Leandrew Koyanagi, MD;  Location: [facility];  Service: Ophthalmology;  Laterality: Right;   COLON SURGERY     COLONOSCOPY WITH PROPOFOL N/A 07/22/2017   Procedure: COLONOSCOPY WITH PROPOFOL;  Surgeon: Lollie Sails, MD;  Location: [facility] ENDOSCOPY;  Service: Endoscopy;   Laterality: N/A;   COLONOSCOPY WITH PROPOFOL N/A 07/21/2019   Procedure: COLONOSCOPY WITH PROPOFOL;  Surgeon: Toledo, Benay Pike, MD;  Location: [facility] ENDOSCOPY;  Service: Gastroenterology;  Laterality: N/A;   DIAGNOSTIC LAPAROSCOPY     ESOPHAGOGASTRODUODENOSCOPY N/A 07/21/2019   Procedure: ESOPHAGOGASTRODUODENOSCOPY (EGD);  Surgeon: Toledo, Benay Pike, MD;  Location: [facility] ENDOSCOPY;  Service: Gastroenterology;  Laterality: N/A;   EYE SURGERY     DETACHED RETINA   LAPAROSCOPIC BOWEL RESECTION     RETINAL DETACHMENT SURGERY     TONSILLECTOMY     WISDOM TOOTH EXTRACTION      There were no vitals filed for this visit.   Subjective Assessment - 11/07/20 1519     Subjective Neck is pretty good. A little bit of tighness and soreness. Not really bothering him. Turning his head quickly sometimes zaps him when his head if forward. Not when in chin tuck. 5-6/10 neck pain at most for the past 7 days, but tolerable. 0/10 L shoulder currently and 3/10 at most for the past 7 days.    Pertinent History Cervicalgia. R shoulder feels better than the L. L arm feels week. Feels like a pinch in L shoulder. 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 Started feeling L anterior shoulder joint pain yesterday. Pt is R hand dominant. L UE used to be as strong as R UE. L UE now feels more  weak. Turning his head to the R and L as well as looking down bothers his neck. Neck is not really bothering him today but turning a certain way can bother him. L upper trap and lateral neck pain > R side. Had chiropractor and did not get better. PA prescribed muscle relaxer and anti inflammatory medicine which did not help. Had x-rays which revealed arthritis and scoliosis in neck. Neck pain became noticeable about 6 months ago. 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    Patient Stated Goals Decrease pain.    Currently in Pain? No/denies    Pain Score 0-No pain    Pain Onset More than a month ago                                         PT Education - 11/07/20 1851     Education Details ther-ex    Person(s) Educated Patient    Methods Explanation;Demonstration;Tactile cues;Verbal cues    Comprehension Returned demonstration;Verbalized understanding             Objective       L shoulder joint pain. No difficulty gripping per pt.    Medbridge Access Code [email or web address]      Manual therapy   Seats STM R and L upper trap muscle to decrease tension       Therapeutic exercise   In response to pt questions, pt education of L shoulder impingement, mechanics, anatomy as well as total shoulder replacement and reverse total shoulder replacement. Pt demonstrated and verbalized understanding. Visuals such as shoulder model and pictures utilized.  B scapular retraction with shoulder flexion 1 lbs each hand 10x5 seconds to 90 degrees  Scapular retraction green band 10x2 with 5 second holds  Wall push up with plus 10x5 seconds to promote serratus anterior muscle strengthening.         Improved exercise technique, movement at target joints, use of target muscles after mod verbal, visual, tactile cues.         Response to treatment Pt tolerated session well without aggravation of symptoms.        Clinical impression Improving neck and shoulder comfort level based on subjective reports. Continued working on scapular strengthening to decrease shoulder and neck pain with work related tasks. Pt tolerated session well without aggravation of symptoms.  Pt will benefit from skilled physical therapy services to address the aforementioned deficits.          PT Short Term Goals - 10/11/20 1626       PT SHORT TERM GOAL #1   Title Pt will be independent with his initial HEP to decrease pain, improve ability to look around as well as raise his L arm up more comfortably.    Baseline Pt has started his HEP (10/11/2020)    Time 3    Period Weeks    Status New     Target Date 11/02/20               PT Long Term Goals - 10/11/20 1627       PT LONG TERM GOAL #1   Title Pt will have a decrease in neck pain to 3/10 or less at worst to promote ability to look around more comfortably.    Baseline 7/10 at worst for the past 3 months (10/11/2020)    Time 8  Period Weeks    Status New    Target Date 12/07/20      PT LONG TERM GOAL #2   Title Pt will improve R and L cervical AROM to at least 65 degrees each direction to promote ability to look around such as when driving, more comfortably.    Baseline Cervical R and L rotation AROM 55 degrees (10/11/2020)    Time 8    Period Weeks    Status New    Target Date 12/07/20      PT LONG TERM GOAL #3   Title Pt will improve L shoulder flexion AROM by at least 10 degrees to promote ability to raise his arm with less diffiulty.    Baseline L shoulder flexion AROM 143 degrees with subacromial pain (10/11/2020)    Time 8    Period Weeks    Status New    Target Date 12/07/20      PT LONG TERM GOAL #4   Title Pt will improve his neck FOTO score by at least 10 points as a demonstration of improved function.    Baseline Neck FOTO 52 (10/11/2020)    Time 8    Period Weeks    Status New    Target Date 12/07/20                   Plan - 11/07/20 1851     Clinical Impression Statement Improving neck and shoulder comfort level based on subjective reports. Continued working on scapular strengthening to decrease shoulder and neck pain with work related tasks. Pt tolerated session well without aggravation of symptoms.  Pt will benefit from skilled physical therapy services to address the aforementioned deficits.    Personal Factors and Comorbidities Comorbidity 3+;Age;Fitness;Time since onset of injury/illness/exacerbation    Comorbidities Hx of CA, depression, HTN    Examination-Activity Limitations Lift;Other   looking around   Stability/Clinical Decision Making Stable/Uncomplicated   Pain seems to  be worsening based on subjective reports   Clinical Decision Making Low    Rehab Potential Fair    PT Frequency 2x / week    PT Duration 8 weeks    PT Treatment/Interventions Therapeutic activities;Therapeutic exercise;Neuromuscular re-education;Patient/family education;Manual techniques;Dry needling;Electrical Stimulation;Iontophoresis 4mg /ml Dexamethasone    PT Next Visit Plan posture, scapular, anterior cervical, rotator cuff strengthening, manual techniques, modalities PRN    PT Home Exercise Plan [employer] Access Code [email or web address]    Consulted and Agree with Plan of Care Patient             Patient will benefit from skilled therapeutic intervention in order to improve the following deficits and impairments:  Pain, Improper body mechanics, Postural dysfunction, Decreased range of motion  Visit Diagnosis: Cervicalgia  Left shoulder pain, unspecified chronicity     Problem List Patient Active Problem List   Diagnosis Date Noted   Recurrent syncope 04/22/2020   Enlarged prostate with urinary obstruction 06/20/2014   Cancer of sigmoid colon ([place]) 06/20/2014   Depression 11/24/2013   Hypertension 11/24/2013    Joneen Boers PT, DPT   11/07/2020, 6:57 PM  [facility] [address], [place], [address] Phone: [phone]   Fax:  [phone]  Name: Matthew Webster MRN: [id number] Date of Birth: Aug 05, 1950

## 2020-11-14 ENCOUNTER — Ambulatory Visit: Payer: Medicare Other

## 2020-11-16 ENCOUNTER — Ambulatory Visit: Payer: Medicare Other

## 2020-11-16 DIAGNOSIS — M25512 Pain in left shoulder: Secondary | ICD-10-CM

## 2020-11-16 DIAGNOSIS — M542 Cervicalgia: Secondary | ICD-10-CM | POA: Diagnosis not present

## 2020-11-16 NOTE — Therapy (Signed)
[facility] [address] [place], [address] Phone: [phone]   Fax:  [phone]  Physical Therapy Treatment  Patient Details  Name: Matthew Webster MRN: [id number] Date of Birth: 1950-06-18 Referring Provider (PT): Wayland Denis, [place]   Encounter Date: 11/16/2020   PT End of Session - 11/16/20 1332     Visit Number 5    Number of Visits 17    Date for PT Re-Evaluation 12/07/20    Authorization Type 5    Authorization Time Period 10    PT Start Time 1332    PT Stop Time 1410    PT Time Calculation (min) 38 min    Activity Tolerance Patient tolerated treatment well    Behavior During Therapy [facility] for tasks assessed/performed             Past Medical History:  Diagnosis Date   Actinic keratosis    Anxiety    Benign non-nodular prostatic hyperplasia with lower urinary tract symptoms    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/20/2014   Depression 11/24/2013   Overview:  Questionable bipolar   Diverticulosis    Fatty liver    on CT   Hemorrhoids    History of chicken pox    Hx of basal cell carcinoma 12/24/2016   Left anterior ear helix   Hx of basal cell carcinoma 06/24/2017   Left upper forehead   Hx of basal cell carcinoma 08/14/2017   Right upper forehead   Hypertension 11/24/2013   Personal history of colonic polyps 02/24/2014    Past Surgical History:  Procedure Laterality Date   CATARACT EXTRACTION W/ INTRAOCULAR LENS IMPLANT Left 2011   CATARACT EXTRACTION W/PHACO Right 05/26/2019   Procedure: CATARACT EXTRACTION PHACO AND INTRAOCULAR LENS PLACEMENT (IOC) RIGHT 10.64  01:10.2  15.1%;  Surgeon: Leandrew Koyanagi, MD;  Location: [place];  Service: Ophthalmology;  Laterality: Right;   COLON SURGERY     COLONOSCOPY WITH PROPOFOL N/A 07/22/2017   Procedure: COLONOSCOPY WITH PROPOFOL;  Surgeon: Lollie Sails, MD;  Location: [facility] ENDOSCOPY;  Service: Endoscopy;   Laterality: N/A;   COLONOSCOPY WITH PROPOFOL N/A 07/21/2019   Procedure: COLONOSCOPY WITH PROPOFOL;  Surgeon: Toledo, Benay Pike, MD;  Location: [facility] ENDOSCOPY;  Service: Gastroenterology;  Laterality: N/A;   DIAGNOSTIC LAPAROSCOPY     ESOPHAGOGASTRODUODENOSCOPY N/A 07/21/2019   Procedure: ESOPHAGOGASTRODUODENOSCOPY (EGD);  Surgeon: Toledo, Benay Pike, MD;  Location: [facility] ENDOSCOPY;  Service: Gastroenterology;  Laterality: N/A;   EYE SURGERY     DETACHED RETINA   LAPAROSCOPIC BOWEL RESECTION     RETINAL DETACHMENT SURGERY     TONSILLECTOMY     WISDOM TOOTH EXTRACTION      There were no vitals filed for this visit.   Subjective Assessment - 11/16/20 1333     Subjective Neck and shoulder are doing pretty good. Learning to keep his shoulders down while working helps. Would not necessarily call it pain. Just aware of it.    Pertinent History Cervicalgia. R shoulder feels better than the L. L arm feels week. Feels like a pinch in L shoulder. 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 Started feeling L anterior shoulder joint pain yesterday. Pt is R hand dominant. L UE used to be as strong as R UE. L UE now feels more weak. Turning his head to the R and L as well as looking down bothers his neck. Neck is not really bothering him today but turning a certain way can bother  him. L upper trap and lateral neck pain > R side. Had chiropractor and did not get better. PA prescribed muscle relaxer and anti inflammatory medicine which did not help. Had x-rays which revealed arthritis and scoliosis in neck. Neck pain became noticeable about 6 months ago. 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    Patient Stated Goals Decrease pain.    Currently in Pain? No/denies    Pain Onset More than a month ago                                        PT Education - 11/16/20 1336     Education Details ther-ex, HEP    Person(s) Educated Patient    Methods  Explanation;Demonstration;Tactile cues;Verbal cues;Handout    Comprehension Returned demonstration;Verbalized understanding            Objective       L shoulder joint pain. No difficulty gripping per pt.    No latex allergies    Medbridge Access Code [email or web address]      Manual therapy   Seats STM R and L upper trap muscle to decrease tension       Therapeutic exercise     Scapular retraction green band 10x3 with 5 second holds  Shoulder ER yellow band 10x2  Seated chin tucks 10x5 seconds for 2 sets  B scapular retraction with shoulder flexion 1 lbs each hand 10x5 seconds to 90 degrees for 2 sets   Cervical rotation AROM  R 62 degrees  L 60 degrees  No pain  L shoulder flexion AROM 143 degrees flexion   Standing L shoulder flexion to 90 degrees 2 lbs to promote ability to lift a gallon of milk. 10x3 .     Improved exercise technique, movement at target joints, use of target muscles after mod verbal, visual, tactile cues.          Response to treatment Pt tolerated session well without aggravation of symptoms.        Clinical impression Improved scapular control with exercises observed. Continued with scapular strengthening, thoracic extension and ER strengthening to promote better shoulder and cervical mechanics and decrease stress to affected areas. Pt tolerated session well without aggravation of symptoms.  Pt will benefit from skilled physical therapy services to address the aforementioned deficits.      PT Short Term Goals - 10/11/20 1626       PT SHORT TERM GOAL #1   Title Pt will be independent with his initial HEP to decrease pain, improve ability to look around as well as raise his L arm up more comfortably.    Baseline Pt has started his HEP (10/11/2020)    Time 3    Period Weeks    Status New    Target Date 11/02/20               PT Long Term Goals - 10/11/20 1627       PT LONG TERM GOAL #1   Title Pt will have a decrease in neck  pain to 3/10 or less at worst to promote ability to look around more comfortably.    Baseline 7/10 at worst for the past 3 months (10/11/2020)    Time 8    Period Weeks    Status New    Target Date 12/07/20      PT LONG TERM  GOAL #2   Title Pt will improve R and L cervical AROM to at least 65 degrees each direction to promote ability to look around such as when driving, more comfortably.    Baseline Cervical R and L rotation AROM 55 degrees (10/11/2020)    Time 8    Period Weeks    Status New    Target Date 12/07/20      PT LONG TERM GOAL #3   Title Pt will improve L shoulder flexion AROM by at least 10 degrees to promote ability to raise his arm with less diffiulty.    Baseline L shoulder flexion AROM 143 degrees with subacromial pain (10/11/2020)    Time 8    Period Weeks    Status New    Target Date 12/07/20      PT LONG TERM GOAL #4   Title Pt will improve his neck FOTO score by at least 10 points as a demonstration of improved function.    Baseline Neck FOTO 52 (10/11/2020)    Time 8    Period Weeks    Status New    Target Date 12/07/20                   Plan - 11/16/20 1325     Clinical Impression Statement Improved scapular control with exercises observed. Continued with scapular strengthening, thoracic extension and ER strengthening to promote better shoulder and cervical mechanics and decrease stress to affected areas. Pt tolerated session well without aggravation of symptoms.  Pt will benefit from skilled physical therapy services to address the aforementioned deficits.    Personal Factors and Comorbidities Comorbidity 3+;Age;Fitness;Time since onset of injury/illness/exacerbation    Comorbidities Hx of CA, depression, HTN    Examination-Activity Limitations Lift;Other   looking around   Stability/Clinical Decision Making Stable/Uncomplicated   Pain seems to be worsening based on subjective reports   Rehab Potential Fair    PT Frequency 2x / week    PT  Duration 8 weeks    PT Treatment/Interventions Therapeutic activities;Therapeutic exercise;Neuromuscular re-education;Patient/family education;Manual techniques;Dry needling;Electrical Stimulation;Iontophoresis 4mg /ml Dexamethasone    PT Next Visit Plan posture, scapular, anterior cervical, rotator cuff strengthening, manual techniques, modalities PRN    PT Home Exercise Plan Medbridge Access Code [email or web address]    Consulted and Agree with Plan of Care Patient             Patient will benefit from skilled therapeutic intervention in order to improve the following deficits and impairments:  Pain, Improper body mechanics, Postural dysfunction, Decreased range of motion  Visit Diagnosis: Cervicalgia  Left shoulder pain, unspecified chronicity     Problem List Patient Active Problem List   Diagnosis Date Noted   Recurrent syncope 04/22/2020   Enlarged prostate with urinary obstruction 06/20/2014   Cancer of sigmoid colon ([place]) 06/20/2014   Depression 11/24/2013   Hypertension 11/24/2013    Joneen Boers PT, DPT   11/16/2020, 2:16 PM  [facility] [address], [place], [address] Phone: [phone]   Fax:  [phone]  Name: Matthew Webster MRN: [id number] Date of Birth: [birth year]

## 2020-11-16 NOTE — Patient Instructions (Signed)
Access Code: [email or web address] URL: [email or web address] Date: 11/16/2020 Prepared by: Joneen Boers  Exercises Seated Scapular Retraction - 1 x daily - 7 x weekly - 3 sets - 10 reps - 5 seconds hold Supine Cervical Retraction with Towel - 1 x daily - 7 x weekly - 3 sets - 10 reps - 5 seconds hold Seated Isometric Elbow Extension - 1 x daily - 7 x weekly - 3 sets - 10 reps - 5 seconds hold Scapular Retraction with Resistance - 1 x daily - 7 x weekly - 3 sets - 10 reps - 5 seconds hold Shoulder External Rotation and Scapular Retraction with Resistance - 1 x daily - 7 x weekly - 2 sets - 10 reps

## 2020-11-21 ENCOUNTER — Ambulatory Visit: Payer: Medicare Other

## 2020-11-21 DIAGNOSIS — M542 Cervicalgia: Secondary | ICD-10-CM | POA: Diagnosis not present

## 2020-11-21 DIAGNOSIS — M25512 Pain in left shoulder: Secondary | ICD-10-CM

## 2020-11-21 NOTE — Therapy (Signed)
[facility] [address] [place], [address] Phone: [phone]   Fax:  [phone]  Physical Therapy Treatment  Patient Details  Name: Matthew Webster MRN: [id number] Date of Birth: 11/05/1950 Referring Provider (PT): Wayland Denis, [place]   Encounter Date: 11/21/2020   PT End of Session - 11/21/20 1331     Visit Number 6    Number of Visits 17    Date for PT Re-Evaluation 12/07/20    Authorization Type 6    Authorization Time Period 10    PT Start Time 1331    PT Stop Time 1416    PT Time Calculation (min) 45 min    Activity Tolerance Patient tolerated treatment well    Behavior During Therapy [facility] for tasks assessed/performed             Past Medical History:  Diagnosis Date   Actinic keratosis    Anxiety    Benign non-nodular prostatic hyperplasia with lower urinary tract symptoms    Cancer of sigmoid colon ([place]) 06/20/2014   Depression 11/24/2013   Overview:  Questionable bipolar   Diverticulosis    Fatty liver    on CT   Hemorrhoids    History of chicken pox    Hx of basal cell carcinoma 12/24/2016   Left anterior ear helix   Hx of basal cell carcinoma 06/24/2017   Left upper forehead   Hx of basal cell carcinoma 08/14/2017   Right upper forehead   Hypertension 11/24/2013   Personal history of colonic polyps 02/24/2014    Past Surgical History:  Procedure Laterality Date   CATARACT EXTRACTION W/ INTRAOCULAR LENS IMPLANT Left 2011   CATARACT EXTRACTION W/PHACO Right 05/26/2019   Procedure: CATARACT EXTRACTION PHACO AND INTRAOCULAR LENS PLACEMENT (IOC) RIGHT 10.64  01:10.2  15.1%;  Surgeon: Leandrew Koyanagi, MD;  Location: [place];  Service: Ophthalmology;  Laterality: Right;   COLON SURGERY     COLONOSCOPY WITH PROPOFOL N/A 07/22/2017   Procedure: COLONOSCOPY WITH PROPOFOL;  Surgeon: Lollie Sails, MD;  Location: [facility] ENDOSCOPY;  Service: Endoscopy;   Laterality: N/A;   COLONOSCOPY WITH PROPOFOL N/A 07/21/2019   Procedure: COLONOSCOPY WITH PROPOFOL;  Surgeon: Toledo, Benay Pike, MD;  Location: [facility] ENDOSCOPY;  Service: Gastroenterology;  Laterality: N/A;   DIAGNOSTIC LAPAROSCOPY     ESOPHAGOGASTRODUODENOSCOPY N/A 07/21/2019   Procedure: ESOPHAGOGASTRODUODENOSCOPY (EGD);  Surgeon: Toledo, Benay Pike, MD;  Location: [facility] ENDOSCOPY;  Service: Gastroenterology;  Laterality: N/A;   EYE SURGERY     DETACHED RETINA   LAPAROSCOPIC BOWEL RESECTION     RETINAL DETACHMENT SURGERY     TONSILLECTOMY     WISDOM TOOTH EXTRACTION      There were no vitals filed for this visit.   Subjective Assessment - 11/21/20 1332     Subjective Neck and shoulder are getting better. A little tighness with turning his head to the R, not pain.    Pertinent History Cervicalgia. R shoulder feels better than the L. L arm feels week. Feels like a pinch in L shoulder. 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 Started feeling L anterior shoulder joint pain yesterday. Pt is R hand dominant. L UE used to be as strong as R UE. L UE now feels more weak. Turning his head to the R and L as well as looking down bothers his neck. Neck is not really bothering him today but turning a certain way can bother him. L upper trap and lateral neck pain >  R side. Had chiropractor and did not get better. PA prescribed muscle relaxer and anti inflammatory medicine which did not help. Had x-rays which revealed arthritis and scoliosis in neck. Neck pain became noticeable about 6 months ago. 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    Patient Stated Goals Decrease pain.    Currently in Pain? No/denies    Pain Onset More than a month ago                                        PT Education - 11/21/20 1640     Education Details ther-ex    [employer]) Educated Patient    Methods Explanation;Demonstration;Tactile cues;Verbal cues    Comprehension Returned  demonstration;Verbalized understanding            Objective       L shoulder joint pain. No difficulty gripping per pt.    No latex allergies       Medbridge Access Code [email or web address]      Manual therapy   Seats STM R and L cervical paraspinal and upper trap muscle to decrease tension and fascial restrictions  Seated STM R levator scaulae toi decrease tension    Supine STM to L teres major muscle to decrease tension    Seated STM L distal pectoralis muscle     Therapeutic exercise   Scapular retraction targeting the lower trap muscles   R 10x5 seconds for 2 sets  L 10x5 seconds  for 2 sets  Supine cervical nod 10x10 seconds for 2 sets   154 degrees L shoulder flexion AROM after session.      Improved exercise technique, movement at target joints, use of target muscles after mod verbal, visual, tactile cues.          Response to treatment Pt tolerated session well without aggravation of symptoms.        Clinical impression Improved L shoulder flexion AROM after treatment to decrease teres major muscle tension. Continued working on scapular strength as well as decreasing soft tissue restrictions posterior neck and upper trap muscles. Pt making very good progress overall with decreased neck and L shoulder pain based on subjective reports. Pt tolerated session well without aggravation of symptoms.  Pt will benefit from skilled physical therapy services to address the aforementioned deficits.       PT Short Term Goals - 10/11/20 1626       PT SHORT TERM GOAL #1   Title Pt will be independent with his initial HEP to decrease pain, improve ability to look around as well as raise his L arm up more comfortably.    Baseline Pt has started his HEP (10/11/2020)    Time 3    Period Weeks    Status New    Target Date 11/02/20               PT Long Term Goals - 10/11/20 1627       PT LONG TERM GOAL #1   Title Pt will have a decrease in neck pain to 3/10 or  less at worst to promote ability to look around more comfortably.    Baseline 7/10 at worst for the past 3 months (10/11/2020)    Time 8    Period Weeks    Status New    Target Date 12/07/20      PT LONG  TERM GOAL #2   Title Pt will improve R and L cervical AROM to at least 65 degrees each direction to promote ability to look around such as when driving, more comfortably.    Baseline Cervical R and L rotation AROM 55 degrees (10/11/2020)    Time 8    Period Weeks    Status New    Target Date 12/07/20      PT LONG TERM GOAL #3   Title Pt will improve L shoulder flexion AROM by at least 10 degrees to promote ability to raise his arm with less diffiulty.    Baseline L shoulder flexion AROM 143 degrees with subacromial pain (10/11/2020)    Time 8    Period Weeks    Status New    Target Date 12/07/20      PT LONG TERM GOAL #4   Title Pt will improve his neck FOTO score by at least 10 points as a demonstration of improved function.    Baseline Neck FOTO 52 (10/11/2020)    Time 8    Period Weeks    Status New    Target Date 12/07/20                   Plan - 11/21/20 1328     Clinical Impression Statement Improved L shoulder flexion AROM after treatment to decrease teres major muscle tension. Continued working on scapular strength as well as decreasing soft tissue restrictions posterior neck and upper trap muscles. Pt making very good progress overall with decreased neck and L shoulder pain based on subjective reports. Pt tolerated session well without aggravation of symptoms.  Pt will benefit from skilled physical therapy services to address the aforementioned deficits.    Personal Factors and Comorbidities Comorbidity 3+;Age;Fitness;Time since onset of injury/illness/exacerbation    Comorbidities Hx of CA, depression, HTN    Examination-Activity Limitations Lift;Other   looking around   Stability/Clinical Decision Making Stable/Uncomplicated   Pain seems to be worsening based on  subjective reports   Clinical Decision Making Low    Rehab Potential Fair    PT Frequency 2x / week    PT Duration 8 weeks    PT Treatment/Interventions Therapeutic activities;Therapeutic exercise;Neuromuscular re-education;Patient/family education;Manual techniques;Dry needling;Electrical Stimulation;Iontophoresis 4mg /ml Dexamethasone    PT Next Visit Plan posture, scapular, anterior cervical, rotator cuff strengthening, manual techniques, modalities PRN    PT Home Exercise Plan Medbridge Access Code [email or web address]    Consulted and Agree with Plan of Care Patient             Patient will benefit from skilled therapeutic intervention in order to improve the following deficits and impairments:  Pain, Improper body mechanics, Postural dysfunction, Decreased range of motion  Visit Diagnosis: Cervicalgia  Left shoulder pain, unspecified chronicity     Problem List Patient Active Problem List   Diagnosis Date Noted   Recurrent syncope 04/22/2020   Enlarged prostate with urinary obstruction 06/20/2014   Cancer of sigmoid colon ([place]) 06/20/2014   Depression 11/24/2013   Hypertension 11/24/2013    Joneen Boers PT, DPT   11/21/2020, 4:44 PM  [facility] [address] [place], [address] Phone: [phone]   Fax:  [phone]  Name: Matthew Webster MRN: [id number] Date of Birth: 1950/11/03

## 2020-11-23 ENCOUNTER — Ambulatory Visit: Payer: Medicare Other

## 2020-11-27 ENCOUNTER — Ambulatory Visit: Payer: Medicare Other

## 2020-11-27 DIAGNOSIS — M542 Cervicalgia: Secondary | ICD-10-CM

## 2020-11-27 DIAGNOSIS — M25512 Pain in left shoulder: Secondary | ICD-10-CM

## 2020-11-27 NOTE — Therapy (Signed)
[facility] [address], [place], [address] Phone: [phone]   Fax:  [phone]  Physical Therapy Treatment  Patient Details  Name: Matthew Webster MRN: [id number] Date of Birth: [birth year] Referring Provider (PT): Wayland Denis, [place]   Encounter Date: 11/27/2020   PT End of Session - 11/27/20 1504     Visit Number 7    Number of Visits 17    Date for PT Re-Evaluation 12/07/20    Authorization Type 7    Authorization Time Period 10    PT Start Time 1504    PT Stop Time 1546    PT Time Calculation (min) 42 min    Activity Tolerance Patient tolerated treatment well    Behavior During Therapy [facility] for tasks assessed/performed             Past Medical History:  Diagnosis Date   Actinic keratosis    Anxiety    Benign non-nodular prostatic hyperplasia with lower urinary tract symptoms    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   Depression 11/24/2013   Overview:  Questionable bipolar   Diverticulosis    Fatty liver    on CT   Hemorrhoids    History of chicken pox    Hx of basal cell carcinoma 12/24/2016   Left anterior ear helix   Hx of basal cell carcinoma 06/24/2017   Left upper forehead   Hx of basal cell carcinoma 08/14/2017   Right upper forehead   Hypertension 11/24/2013   Personal history of colonic polyps 02/24/2014    Past Surgical History:  Procedure Laterality Date   CATARACT EXTRACTION W/ INTRAOCULAR LENS IMPLANT Left 2011   CATARACT EXTRACTION W/PHACO Right 05/26/2019   Procedure: CATARACT EXTRACTION PHACO AND INTRAOCULAR LENS PLACEMENT (IOC) RIGHT 10.64  01:10.2  15.1%;  Surgeon: Leandrew Koyanagi, MD;  Location: [place];  Service: Ophthalmology;  Laterality: Right;   COLON SURGERY     COLONOSCOPY WITH PROPOFOL N/A 07/22/2017   Procedure: COLONOSCOPY WITH PROPOFOL;  Surgeon: Lollie Sails, MD;  Location[facility] ENDOSCOPY;  Service: Endoscopy;   Laterality: N/A;   COLONOSCOPY WITH PROPOFOL N/A 07/21/2019   Procedure: COLONOSCOPY WITH PROPOFOL;  Surgeon: Toledo, Benay Pike, MD;  Location: [facility] ENDOSCOPY;  Service: Gastroenterology;  Laterality: N/A;   DIAGNOSTIC LAPAROSCOPY     ESOPHAGOGASTRODUODENOSCOPY N/A 07/21/2019   Procedure: ESOPHAGOGASTRODUODENOSCOPY (EGD);  Surgeon: Toledo, Benay Pike, MD;  Location: [facility] ENDOSCOPY;  Service: Gastroenterology;  Laterality: N/A;   EYE SURGERY     DETACHED RETINA   LAPAROSCOPIC BOWEL RESECTION     RETINAL DETACHMENT SURGERY     TONSILLECTOMY     WISDOM TOOTH EXTRACTION      There were no vitals filed for this visit.   Subjective Assessment - 11/27/20 1505     Subjective Neck is probably a 3/10 currently (4/10 at most for the past 7 days when moving his neck a certain way) , not a hundred percent. L shoulder is maybe a 0-1/10 (3-4/10 L shoulder at most for the past 7 days ) currently, not a constant pain.    Pertinent History Cervicalgia. R shoulder feels better than the L. L arm feels week. Feels like a pinch in L shoulder. 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 Started feeling L anterior shoulder joint pain yesterday. Pt is R hand dominant. L UE used to be as strong as R UE. L UE now feels more weak. Turning his head to the R and L  as well as looking down bothers his neck. Neck is not really bothering him today but turning a certain way can bother him. L upper trap and lateral neck pain > R side. Had chiropractor and did not get better. PA prescribed muscle relaxer and anti inflammatory medicine which did not help. Had x-rays which revealed arthritis and scoliosis in neck. Neck pain became noticeable about 6 months ago. 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    Patient Stated Goals Decrease pain.    Currently in Pain? Yes    Pain Score 3     Pain Onset More than a month ago                                        PT Education - 11/27/20 1509      Education Details ther-ex    Person(s) Educated Patient    Methods Explanation;Demonstration;Tactile cues;Verbal cues    Comprehension Returned demonstration;Verbalized understanding            Objective       L shoulder joint pain. No difficulty gripping per pt.    No latex allergies       Medbridge Access Code [email or web address]      Manual therapy   Seats STM R and L cervical paraspinal and upper trap muscle to decrease tension and fascial restrictions    Seated STM R distal scalene area   Therapeutic exercise  Supine with head on occipital float  Cervical nod 1 minute x 2 Cervical rotation 1 minute x 2  Scapular retraction 10x10 seconds for 2 sets  Standing lower trap raise at the wall   L 10x2 with 5 second hold s  Standing B shoulder extension, with scapular retraction blue band 10x2 with 5 second holds   Scapular retraction targeting the lower trap muscles              R 10x5 seconds for 2 sets             L 10x5 seconds  for 2 sets    Improved exercise technique, movement at target joints, use of target muscles after mod verbal, visual, tactile cues.          Response to treatment Pt tolerated session well without aggravation of symptoms.        Clinical impression Continued working on improving anterior cervical as well as lower scapular strengthening, decreasing cervical paraspinal and upper trap muscle tension to promote ability to turn his neck more comfortably. Pt tolerated session well without aggravation of symptoms. Pt will benefit from continued skilled physical therapy services to decrease pain, improve strength, ROM and function.          PT Short Term Goals - 10/11/20 1626       PT SHORT TERM GOAL #1   Title Pt will be independent with his initial HEP to decrease pain, improve ability to look around as well as raise his L arm up more comfortably.    Baseline Pt has started his HEP (10/11/2020)    Time 3    Period Weeks    Status  New    Target Date 11/02/20               PT Long Term Goals - 10/11/20 1627       PT LONG TERM GOAL #1   Title Pt  will have a decrease in neck pain to 3/10 or less at worst to promote ability to look around more comfortably.    Baseline 7/10 at worst for the past 3 months (10/11/2020)    Time 8    Period Weeks    Status New    Target Date 12/07/20      PT LONG TERM GOAL #2   Title Pt will improve R and L cervical AROM to at least 65 degrees each direction to promote ability to look around such as when driving, more comfortably.    Baseline Cervical R and L rotation AROM 55 degrees (10/11/2020)    Time 8    Period Weeks    Status New    Target Date 12/07/20      PT LONG TERM GOAL #3   Title Pt will improve L shoulder flexion AROM by at least 10 degrees to promote ability to raise his arm with less diffiulty.    Baseline L shoulder flexion AROM 143 degrees with subacromial pain (10/11/2020)    Time 8    Period Weeks    Status New    Target Date 12/07/20      PT LONG TERM GOAL #4   Title Pt will improve his neck FOTO score by at least 10 points as a demonstration of improved function.    Baseline Neck FOTO 52 (10/11/2020)    Time 8    Period Weeks    Status New    Target Date 12/07/20                   Plan - 11/27/20 1514     Clinical Impression Statement Continued working on improving anterior cervical as well as lower scapular strengthening, decreasing cervical paraspinal and upper trap muscle tension to promote ability to turn his neck more comfortably. Pt tolerated session well without aggravation of symptoms. Pt will benefit from continued skilled physical therapy services to decrease pain, improve strength, ROM and function.    Personal Factors and Comorbidities Comorbidity 3+;Age;Fitness;Time since onset of injury/illness/exacerbation    Comorbidities Hx of CA, depression, HTN    Examination-Activity Limitations Lift;Other   looking around    Stability/Clinical Decision Making Stable/Uncomplicated   Pain seems to be worsening based on subjective reports   Clinical Decision Making Low    Rehab Potential Fair    PT Frequency 2x / week    PT Duration 8 weeks    PT Treatment/Interventions Therapeutic activities;Therapeutic exercise;Neuromuscular re-education;Patient/family education;Manual techniques;Dry needling;Electrical Stimulation;Iontophoresis 4mg /ml Dexamethasone    PT Next Visit Plan posture, scapular, anterior cervical, rotator cuff strengthening, manual techniques, modalities PRN    PT Home Exercise Plan Medbridge Access Code [email or web address]    Consulted and Agree with Plan of Care Patient             Patient will benefit from skilled therapeutic intervention in order to improve the following deficits and impairments:  Pain, Improper body mechanics, Postural dysfunction, Decreased range of motion  Visit Diagnosis: Cervicalgia  Left shoulder pain, unspecified chronicity     Problem List Patient Active Problem List   Diagnosis Date Noted   Recurrent syncope 04/22/2020   Enlarged prostate with urinary obstruction 06/20/2014   Cancer of sigmoid colon ([place]) 06/20/2014   Depression 11/24/2013   Hypertension 11/24/2013    Joneen Boers PT, DPT   11/27/2020, 4:03 PM  [facility] [address] [place], [address] Phone: [phone]  Fax:  [phone]  Name: LATERRIAN HEVENER MRN: [id number] Date of Birth: 02/08/1950

## 2020-11-30 ENCOUNTER — Ambulatory Visit: Payer: Medicare Other | Attending: Student

## 2020-11-30 DIAGNOSIS — M25512 Pain in left shoulder: Secondary | ICD-10-CM | POA: Insufficient documentation

## 2020-11-30 DIAGNOSIS — M542 Cervicalgia: Secondary | ICD-10-CM | POA: Diagnosis present

## 2020-11-30 NOTE — Therapy (Signed)
[place] [facility] [address] [place], [address] Phone: [phone]   Fax:  [phone]  Physical Therapy Treatment  Patient Details  Name: Matthew Webster MRN: [id number] Date of Birth: [birth year] Referring Provider (PT): Wayland Denis, [place]   Encounter Date: 11/30/2020   PT End of Session - 11/30/20 1506     Visit Number 8    Number of Visits 17    Date for PT Re-Evaluation 12/07/20    Authorization Type 8    Authorization Time Period 10    PT Start Time 1506    PT Stop Time 5643    PT Time Calculation (min) 38 min    Activity Tolerance Patient tolerated treatment well    Behavior During Therapy [facility] for tasks assessed/performed             Past Medical History:  Diagnosis Date   Actinic keratosis    Anxiety    Benign non-nodular prostatic hyperplasia with lower urinary tract symptoms    Cancer of sigmoid colon ([place]) 06/20/2014   Depression 11/24/2013   Overview:  Questionable bipolar   Diverticulosis    Fatty liver    on CT   Hemorrhoids    History of chicken pox    Hx of basal cell carcinoma 12/24/2016   Left anterior ear helix   Hx of basal cell carcinoma 06/24/2017   Left upper forehead   Hx of basal cell carcinoma 08/14/2017   Right upper forehead   Hypertension 11/24/2013   Personal history of colonic polyps 02/24/2014    Past Surgical History:  Procedure Laterality Date   CATARACT EXTRACTION W/ INTRAOCULAR LENS IMPLANT Left 2011   CATARACT EXTRACTION W/PHACO Right 05/26/2019   Procedure: CATARACT EXTRACTION PHACO AND INTRAOCULAR LENS PLACEMENT (IOC) RIGHT 10.64  01:10.2  15.1%;  Surgeon: Leandrew Koyanagi, MD;  Location: [place];  Service: Ophthalmology;  Laterality: Right;   COLON SURGERY     COLONOSCOPY WITH PROPOFOL N/A 07/22/2017   Procedure: COLONOSCOPY WITH PROPOFOL;  Surgeon: Lollie Sails, MD;  [facility] ENDOSCOPY;  Service: Endoscopy;  Laterality:  N/A;   COLONOSCOPY WITH PROPOFOL N/A 07/21/2019   Procedure: COLONOSCOPY WITH PROPOFOL;  Surgeon: Toledo, Benay Pike, MD;  Location: [facility] ENDOSCOPY;  Service: Gastroenterology;  Laterality: N/A;   DIAGNOSTIC LAPAROSCOPY     ESOPHAGOGASTRODUODENOSCOPY N/A 07/21/2019   Procedure: ESOPHAGOGASTRODUODENOSCOPY (EGD);  Surgeon: Toledo, Benay Pike, MD;  Location: [facility] ENDOSCOPY;  Service: Gastroenterology;  Laterality: N/A;   EYE SURGERY     DETACHED RETINA   LAPAROSCOPIC BOWEL RESECTION     RETINAL DETACHMENT SURGERY     TONSILLECTOMY     WISDOM TOOTH EXTRACTION      There were no vitals filed for this visit.   Subjective Assessment - 11/30/20 1507     Subjective R neck was bothering him after last session. Eased off that past 2 days. Feels pretty good currently.    Pertinent History Cervicalgia. R shoulder feels better than the L. L arm feels week. Feels like a pinch in L shoulder. 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 Started feeling L anterior shoulder joint pain yesterday. Pt is R hand dominant. L UE used to be as strong as R UE. L UE now feels more weak. Turning his head to the R and L as well as looking down bothers his neck. Neck is not really bothering him today but turning a certain way can bother him. L upper trap and lateral neck pain >  R side. Had chiropractor and did not get better. PA prescribed muscle relaxer and anti inflammatory medicine which did not help. Had x-rays which revealed arthritis and scoliosis in neck. Neck pain became noticeable about 6 months ago. 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    Patient Stated Goals Decrease pain.    Currently in Pain? No/denies    Pain Score 0-No pain    Pain Onset More than a month ago                                        PT Education - 11/30/20 1642     Education Details ther-ex    Person(s) Educated Patient    Methods Explanation;Demonstration;Tactile cues;Verbal cues     Comprehension Returned demonstration;Verbalized understanding           Objective       L shoulder joint pain. No difficulty gripping per pt.    No latex allergies       Medbridge Access Code [email or web address]      Therapeutic exercise   Cervical rotatoin at start of session   R 65 degrees  L 63 degrees  Standing L shoulder flexion AROM 155 degrees at start of session   Cervical side bend stretch  L 30 seconds x 3  First rib stretch with R and L cervical rotation   R side 10x3  L side 10x3  Scapular retraction targeting the lower trap muscles              R 10x5 seconds for 3 sets             L 10x5 seconds  for 3 sets   Chin tuck 10x5 seconds for 3 sets        Improved exercise technique, movement at target joints, use of target muscles after mod verbal, visual, tactile cues.          Response to treatment Pt tolerated session well without aggravation of symptoms.        Clinical impression Improved L shoulder flexion AROM as well as cervical rotation AROM since initial evaluation. Continued working on scapular strength, as well as cervical muscle flexibility to continue progress. Pt tolerated session well without aggravation of symptoms. Pt will benefit from continued skilled physical therapy services to decrease pain, improve strength, ROM and function.         PT Short Term Goals - 10/11/20 1626       PT SHORT TERM GOAL #1   Title Pt will be independent with his initial HEP to decrease pain, improve ability to look around as well as raise his L arm up more comfortably.    Baseline Pt has started his HEP (10/11/2020)    Time 3    Period Weeks    Status New    Target Date 11/02/20               PT Long Term Goals - 10/11/20 1627       PT LONG TERM GOAL #1   Title Pt will have a decrease in neck pain to 3/10 or less at worst to promote ability to look around more comfortably.    Baseline 7/10 at worst for the past 3 months (10/11/2020)    Time 8     Period Weeks    Status New    Target Date  12/07/20      PT LONG TERM GOAL #2   Title Pt will improve R and L cervical AROM to at least 65 degrees each direction to promote ability to look around such as when driving, more comfortably.    Baseline Cervical R and L rotation AROM 55 degrees (10/11/2020)    Time 8    Period Weeks    Status New    Target Date 12/07/20      PT LONG TERM GOAL #3   Title Pt will improve L shoulder flexion AROM by at least 10 degrees to promote ability to raise his arm with less diffiulty.    Baseline L shoulder flexion AROM 143 degrees with subacromial pain (10/11/2020)    Time 8    Period Weeks    Status New    Target Date 12/07/20      PT LONG TERM GOAL #4   Title Pt will improve his neck FOTO score by at least 10 points as a demonstration of improved function.    Baseline Neck FOTO 52 (10/11/2020)    Time 8    Period Weeks    Status New    Target Date 12/07/20                   Plan - 11/30/20 1642     Clinical Impression Statement Improved L shoulder flexion AROM as well as cervical rotation AROM since initial evaluation. Continued working on scapular strength, as well as cervical muscle flexibility to continue progress. Pt tolerated session well without aggravation of symptoms. Pt will benefit from continued skilled physical therapy services to decrease pain, improve strength, ROM and function.    Personal Factors and Comorbidities Comorbidity 3+;Age;Fitness;Time since onset of injury/illness/exacerbation    Comorbidities Hx of CA, depression, HTN    Examination-Activity Limitations Lift;Other   looking around   Stability/Clinical Decision Making Stable/Uncomplicated   Pain seems to be worsening based on subjective reports   Clinical Decision Making Low    Rehab Potential Fair    PT Frequency 2x / week    PT Duration 8 weeks    PT Treatment/Interventions Therapeutic activities;Therapeutic exercise;Neuromuscular  re-education;Patient/family education;Manual techniques;Dry needling;Electrical Stimulation;Iontophoresis 4mg /ml Dexamethasone    PT Next Visit Plan posture, scapular, anterior cervical, rotator cuff strengthening, manual techniques, modalities PRN    PT Home Exercise Plan Medbridge Access Code [email or web address]    Consulted and Agree with Plan of Care Patient             Patient will benefit from skilled therapeutic intervention in order to improve the following deficits and impairments:  Pain, Improper body mechanics, Postural dysfunction, Decreased range of motion  Visit Diagnosis: Cervicalgia  Left shoulder pain, unspecified chronicity     Problem List Patient Active Problem List   Diagnosis Date Noted   Recurrent syncope 04/22/2020   Enlarged prostate with urinary obstruction 06/20/2014   Cancer of sigmoid colon ([place]) 06/20/2014   Depression 11/24/2013   Hypertension 11/24/2013   Joneen Boers PT, DPT   11/30/2020, 4:55 PM  [place] [facility] [address] [place], [address] Phone: [phone]   Fax:  [phone]  Name: Matthew Webster MRN: [id number] Date of Birth: 1950-07-10

## 2020-12-05 ENCOUNTER — Ambulatory Visit: Payer: Medicare Other

## 2020-12-05 DIAGNOSIS — M25512 Pain in left shoulder: Secondary | ICD-10-CM

## 2020-12-05 DIAGNOSIS — M542 Cervicalgia: Secondary | ICD-10-CM | POA: Diagnosis not present

## 2020-12-05 NOTE — Therapy (Signed)
[facility] [address], [place], [address] Phone: [phone]   Fax:  [phone]  Physical Therapy Treatment  Patient Details  Name: Matthew Webster MRN: [id number] Date of Birth: 12-07-1950 Referring Provider (PT): Wayland Denis, [place]   Encounter Date: 12/05/2020   PT End of Session - 12/05/20 1503     Visit Number 9    Number of Visits 17    Date for PT Re-Evaluation 12/07/20    Authorization Type 9    Authorization Time Period 10    PT Start Time 1503    PT Stop Time 6073    PT Time Calculation (min) 40 min    Activity Tolerance Patient tolerated treatment well    Behavior During Therapy [facility] for tasks assessed/performed             Past Medical History:  Diagnosis Date   Actinic keratosis    Anxiety    Benign non-nodular prostatic hyperplasia with lower urinary tract symptoms    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/20/2014   Depression 11/24/2013   Overview:  Questionable bipolar   Diverticulosis    Fatty liver    on CT   Hemorrhoids    History of chicken pox    Hx of basal cell carcinoma 12/24/2016   Left anterior ear helix   Hx of basal cell carcinoma 06/24/2017   Left upper forehead   Hx of basal cell carcinoma 08/14/2017   Right upper forehead   Hypertension 11/24/2013   Personal history of colonic polyps 02/24/2014    Past Surgical History:  Procedure Laterality Date   CATARACT EXTRACTION W/ INTRAOCULAR LENS IMPLANT Left 2011   CATARACT EXTRACTION W/PHACO Right 05/26/2019   Procedure: CATARACT EXTRACTION PHACO AND INTRAOCULAR LENS PLACEMENT (IOC) RIGHT 10.64  01:10.2  15.1%;  Surgeon: Leandrew Koyanagi, MD;  Location: [place];  Service: Ophthalmology;  Laterality: Right;   COLON SURGERY     COLONOSCOPY WITH PROPOFOL N/A 07/22/2017   Procedure: COLONOSCOPY WITH PROPOFOL;  Surgeon: Lollie Sails, MD;  Location: [facility] ENDOSCOPY;  Service: Endoscopy;  Laterality:  N/A;   COLONOSCOPY WITH PROPOFOL N/A 07/21/2019   Procedure: COLONOSCOPY WITH PROPOFOL;  Surgeon: Toledo, Benay Pike, MD;  Location: [facility] ENDOSCOPY;  Service: Gastroenterology;  Laterality: N/A;   DIAGNOSTIC LAPAROSCOPY     ESOPHAGOGASTRODUODENOSCOPY N/A 07/21/2019   Procedure: ESOPHAGOGASTRODUODENOSCOPY (EGD);  Surgeon: Toledo, Benay Pike, MD;  Location: [facility] ENDOSCOPY;  Service: Gastroenterology;  Laterality: N/A;   EYE SURGERY     DETACHED RETINA   LAPAROSCOPIC BOWEL RESECTION     RETINAL DETACHMENT SURGERY     TONSILLECTOMY     WISDOM TOOTH EXTRACTION      There were no vitals filed for this visit.   Subjective Assessment - 12/05/20 1504     Subjective Neck and shoulder are doing pretty good. Not getting much pain. Just aware of a little bit of soreness in the base of his neck both side. 6-7/10 at most include last Wednesday, about 4/10 at most after last Wednessday. Pt states he feels like he can graduate PT next visit.    Pertinent History Cervicalgia. R shoulder feels better than the L. L arm feels week. Feels like a pinch in L shoulder. 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 Started feeling L anterior shoulder joint pain yesterday. Pt is R hand dominant. L UE used to be as strong as R UE. L UE now feels more weak. Turning his head to the  R and L as well as looking down bothers his neck. Neck is not really bothering him today but turning a certain way can bother him. L upper trap and lateral neck pain > R side. Had chiropractor and did not get better. PA prescribed muscle relaxer and anti inflammatory medicine which did not help. Had x-rays which revealed arthritis and scoliosis in neck. Neck pain became noticeable about 6 months ago. 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    Patient Stated Goals Decrease pain.    Currently in Pain? No/denies    Pain Onset More than a month ago                                          Objective        L shoulder joint pain. No difficulty gripping per pt.    No latex allergies       Medbridge Access Code [email or web address]      Therapeutic exercise   Scapular retraction targeting the lower trap muscles              R 10x5 seconds for 3 sets             L 10x5 seconds  for 3 sets  Cervical side bend stretch             L 30 seconds x 3  R 30 seconds x 3   Chin tuck 10x5 seconds for 3 sets   First rib stretch with R and L cervical rotation              R side 10x3             L side 10x3  Reviewed plan of care: Graduation from PT next visit secondary to overall good progress.           Improved exercise technique, movement at target joints, use of target muscles after mod verbal, visual, tactile cues.      Manual therapy Seated STM B upper trap muscles to decrease muscle tension and fascial restrictions    Response to treatment Pt tolerated session well without aggravation of symptoms.       Clinical impression Continued working on scapular strengthening, upper thoracic extension as well as decreasing upper trap muscle tension to promote better L shoulder mechanics as well as decrease tension to neck.  Pt tolerated session well without aggravation of symptoms. Pt will benefit from continued skilled physical therapy services to decrease pain, improve strength, ROM and function.            PT Short Term Goals - 10/11/20 1626       PT SHORT TERM GOAL #1   Title Pt will be independent with his initial HEP to decrease pain, improve ability to look around as well as raise his L arm up more comfortably.    Baseline Pt has started his HEP (10/11/2020)    Time 3    Period Weeks    Status New    Target Date 11/02/20               PT Long Term Goals - 10/11/20 1627       PT LONG TERM GOAL #1   Title Pt will have a decrease in neck pain to 3/10 or less at worst to promote ability to look around more comfortably.  Baseline 7/10 at worst for the past 3 months  (10/11/2020)    Time 8    Period Weeks    Status New    Target Date 12/07/20      PT LONG TERM GOAL #2   Title Pt will improve R and L cervical AROM to at least 65 degrees each direction to promote ability to look around such as when driving, more comfortably.    Baseline Cervical R and L rotation AROM 55 degrees (10/11/2020)    Time 8    Period Weeks    Status New    Target Date 12/07/20      PT LONG TERM GOAL #3   Title Pt will improve L shoulder flexion AROM by at least 10 degrees to promote ability to raise his arm with less diffiulty.    Baseline L shoulder flexion AROM 143 degrees with subacromial pain (10/11/2020)    Time 8    Period Weeks    Status New    Target Date 12/07/20      PT LONG TERM GOAL #4   Title Pt will improve his neck FOTO score by at least 10 points as a demonstration of improved function.    Baseline Neck FOTO 52 (10/11/2020)    Time 8    Period Weeks    Status New    Target Date 12/07/20                   Plan - 12/05/20 1501     Clinical Impression Statement Continued working on scapular strengthening, upper thoracic extension as well as decreasing upper trap muscle tension to promote better L shoulder mechanics as well as decrease tension to neck.  Pt tolerated session well without aggravation of symptoms. Pt will benefit from continued skilled physical therapy services to decrease pain, improve strength, ROM and function.    Personal Factors and Comorbidities Comorbidity 3+;Age;Fitness;Time since onset of injury/illness/exacerbation    Comorbidities Hx of CA, depression, HTN    Examination-Activity Limitations Lift;Other   looking around   Stability/Clinical Decision Making Stable/Uncomplicated   Pain seems to be worsening based on subjective reports   Clinical Decision Making Low    Rehab Potential Fair    PT Frequency 2x / week    PT Duration 8 weeks    PT Treatment/Interventions Therapeutic activities;Therapeutic exercise;Neuromuscular  re-education;Patient/family education;Manual techniques;Dry needling;Electrical Stimulation;Iontophoresis 4mg /ml Dexamethasone    PT Next Visit Plan posture, scapular, anterior cervical, rotator cuff strengthening, manual techniques, modalities PRN    PT Home Exercise Plan Medbridge Access Code [email or web address]    Consulted and Agree with Plan of Care Patient             Patient will benefit from skilled therapeutic intervention in order to improve the following deficits and impairments:  Pain, Improper body mechanics, Postural dysfunction, Decreased range of motion  Visit Diagnosis: Cervicalgia  Left shoulder pain, unspecified chronicity     Problem List Patient Active Problem List   Diagnosis Date Noted   Recurrent syncope 04/22/2020   Enlarged prostate with urinary obstruction 06/20/2014   Cancer of sigmoid colon ([place]) 06/20/2014   Depression 11/24/2013   Hypertension 11/24/2013    Joneen Boers PT, DPT   12/05/2020, 6:54 PM  [facility] [address], [place], [address] Phone: [phone]   Fax:  [phone]  Name: LENNIX KNEISEL MRN: [id number] Date of Birth: 12/08/1950

## 2020-12-07 ENCOUNTER — Ambulatory Visit: Payer: Medicare Other

## 2020-12-11 ENCOUNTER — Ambulatory Visit: Payer: Medicare Other

## 2020-12-11 ENCOUNTER — Other Ambulatory Visit: Payer: Self-pay

## 2020-12-11 DIAGNOSIS — M542 Cervicalgia: Secondary | ICD-10-CM | POA: Diagnosis not present

## 2020-12-11 DIAGNOSIS — M25512 Pain in left shoulder: Secondary | ICD-10-CM

## 2020-12-11 NOTE — Therapy (Addendum)
[facility] [address], [place], [address] Phone: [phone]   Fax:  [phone]  Physical Therapy Treatment And Discharge Summary  Patient Details  Name: Matthew Webster MRN: [id number] Date of Birth: 08-05-1950 Referring Provider (PT): Wayland Denis, [place]   Encounter Date: 12/11/2020   PT End of Session - 12/11/20 1549     Visit Number 10    Number of Visits 17    Date for PT Re-Evaluation 12/11/20    Authorization Type 10    Authorization Time Period 10    PT Start Time 4332    PT Stop Time 1624    PT Time Calculation (min) 34 min    Activity Tolerance Patient tolerated treatment well    Behavior During Therapy [facility] for tasks assessed/performed             Past Medical History:  Diagnosis Date   Actinic keratosis    Anxiety    Benign non-nodular prostatic hyperplasia with lower urinary tract symptoms    Cancer of sigmoid colon ([place]) 06/20/2014   Depression 11/24/2013   Overview:  Questionable bipolar   Diverticulosis    Fatty liver    on CT   Hemorrhoids    History of chicken pox    Hx of basal cell carcinoma 12/24/2016   Left anterior ear helix   Hx of basal cell carcinoma 06/24/2017   Left upper forehead   Hx of basal cell carcinoma 08/14/2017   Right upper forehead   Hypertension 11/24/2013   Personal history of colonic polyps 02/24/2014    Past Surgical History:  Procedure Laterality Date   CATARACT EXTRACTION W/ INTRAOCULAR LENS IMPLANT Left 2011   CATARACT EXTRACTION W/PHACO Right 05/26/2019   Procedure: CATARACT EXTRACTION PHACO AND INTRAOCULAR LENS PLACEMENT (IOC) RIGHT 10.64  01:10.2  15.1%;  Surgeon: Leandrew Koyanagi, MD;  Location: [place];  Service: Ophthalmology;  Laterality: Right;   COLON SURGERY     COLONOSCOPY WITH PROPOFOL N/A 07/22/2017   Procedure: COLONOSCOPY WITH PROPOFOL;  Surgeon: Lollie Sails, MD;  Location: [facility] ENDOSCOPY;   Service: Endoscopy;  Laterality: N/A;   COLONOSCOPY WITH PROPOFOL N/A 07/21/2019   Procedure: COLONOSCOPY WITH PROPOFOL;  Surgeon: Toledo, Benay Pike, MD;  Location: [facility] ENDOSCOPY;  Service: Gastroenterology;  Laterality: N/A;   DIAGNOSTIC LAPAROSCOPY     ESOPHAGOGASTRODUODENOSCOPY N/A 07/21/2019   Procedure: ESOPHAGOGASTRODUODENOSCOPY (EGD);  Surgeon: Toledo, Benay Pike, MD;  Location: [facility] ENDOSCOPY;  Service: Gastroenterology;  Laterality: N/A;   EYE SURGERY     DETACHED RETINA   LAPAROSCOPIC BOWEL RESECTION     RETINAL DETACHMENT SURGERY     TONSILLECTOMY     WISDOM TOOTH EXTRACTION      There were no vitals filed for this visit.   Subjective Assessment - 12/11/20 1551     Subjective Neck is doing pretty good. L shouler is weak. Trying to exercise it more. Not sure if it is weaker than it was. Did not notice it. Neck bothers him when he moves it. Just tighness. The neck/R upper trap area usually happens at night in bed.  Feels like he is ready to graduate after today's session.    Pertinent History Cervicalgia. R shoulder feels better than the L. L arm feels week. Feels like a pinch in L shoulder. 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 Started feeling L anterior shoulder joint pain yesterday. Pt is R hand dominant. L UE used to be as strong as R UE. L  UE now feels more weak. Turning his head to the R and L as well as looking down bothers his neck. Neck is not really bothering him today but turning a certain way can bother him. L upper trap and lateral neck pain > R side. Had chiropractor and did not get better. PA prescribed muscle relaxer and anti inflammatory medicine which did not help. Had x-rays which revealed arthritis and scoliosis in neck. Neck pain became noticeable about 6 months ago. 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    Patient Stated Goals Decrease pain.    Currently in Pain? No/denies    Pain Onset More than a month ago                                         PT Education - 12/11/20 1606     Education Details ther-ex, HEP    Person(s) Educated Patient    Methods Explanation;Demonstration;Tactile cues;Verbal cues;Handout    Comprehension Verbalized understanding;Returned demonstration           Objective       L shoulder joint pain. No difficulty gripping per pt.    No latex allergies       Medbridge Access Code [email or web address]      Therapeutic exercise   Cervical rotation R and L 1x each  Standing L shoulder flexion AROM 1x  Seated first rib stretch   R 30 seconds x 3  L 30 seconds x 3  Cervical flexion, no pain.   Scapular retraction targeting the lower trap muscles              R 10x5 seconds for 3 sets             L 10x5 seconds  for 3 sets       Improved exercise technique, movement at target joints, use of target muscles after mod verbal, visual, tactile cues.          Response to treatment Pt tolerated session well without aggravation of symptoms.       Clinical impression Pt demonstrates overall improved cervical rotation AROM and shoulder flexion, decreased L shoulder pain and possible decrease in neck pain and improved overall neck function since initial evaluation.Pt has made good progress with PT towards goals. Skilled physical therapy services discharged after re-certification for today's visit with pt continuing progress with his exercises at home.         PT Short Term Goals - 12/11/20 1553       PT SHORT TERM GOAL #1   Title Pt will be independent with his initial HEP to decrease pain, improve ability to look around as well as raise his L arm up more comfortably.    Baseline Pt has started his HEP (10/11/2020); no questions with his HEP (12/11/2020)    Time 3    Period Weeks    Status Achieved    Target Date 11/02/20               PT Long Term Goals - 12/11/20 1554       PT LONG TERM GOAL #1   Title Pt will have a decrease in  neck pain to 3/10 or less at worst to promote ability to look around more comfortably.    Baseline 7/10 at worst for the past 3 months (10/11/2020); Neck feels better since starting  PT (12/11/2020)    Time 8    Period Weeks    Status Partially Met    Target Date 12/07/20      PT LONG TERM GOAL #2   Title Pt will improve R and L cervical AROM to at least 65 degrees each direction to promote ability to look around such as when driving, more comfortably.    Baseline Cervical R and L rotation AROM 55 degrees (10/11/2020); R 62, L 60 degrees, no pain (12/11/2020)    Time 8    Period Weeks    Status Partially Met    Target Date 12/07/20      PT LONG TERM GOAL #3   Title Pt will improve L shoulder flexion AROM by at least 10 degrees to promote ability to raise his arm with less diffiulty.    Baseline L shoulder flexion AROM 143 degrees with subacromial pain (10/11/2020); 152 degrees, no pain (12/11/2020)    Time 8    Period Weeks    Status Partially Met    Target Date 12/07/20      PT LONG TERM GOAL #4   Title Pt will improve his neck FOTO score by at least 10 points as a demonstration of improved function.    Baseline Neck FOTO 52 (10/11/2020); 63 (12/11/2020)    Time 8    Period Weeks    Status Achieved    Target Date 12/07/20                   Plan - 12/11/20 1607     Clinical Impression Statement Pt demonstrates overall improved cervical rotation AROM and shoulder flexion, decreased L shoulder pain and possible decrease in neck pain and improved overall neck function since initial evaluation.Pt has made good progress with PT towards goals. Skilled physical therapy services discharged after re-certification for today's visit with pt continuing progress with his exercises at home.    Personal Factors and Comorbidities Comorbidity 3+;Age;Fitness;Time since onset of injury/illness/exacerbation    Comorbidities Hx of CA, depression, HTN    Examination-Activity Limitations  Lift;Other   looking around   Stability/Clinical Decision Making Stable/Uncomplicated   Pain seems to be worsening based on subjective reports   Clinical Decision Making Low    Rehab Potential Fair    PT Frequency 2x / week    PT Duration 8 weeks    PT Treatment/Interventions Therapeutic activities;Therapeutic exercise;Neuromuscular re-education;Patient/family education;Manual techniques;Dry needling;Electrical Stimulation;Iontophoresis 26m/ml Dexamethasone    PT Next Visit Plan posture, scapular, anterior cervical, rotator cuff strengthening, manual techniques, modalities PRN    PT Home Exercise Plan Medbridge Access Code [email or web address]    Consulted and Agree with Plan of Care Patient             Patient will benefit from skilled therapeutic intervention in order to improve the following deficits and impairments:  Pain, Improper body mechanics, Postural dysfunction, Decreased range of motion  Visit Diagnosis: Cervicalgia - Plan: PT plan of care cert/re-cert  Left shoulder pain, unspecified chronicity - Plan: PT plan of care cert/re-cert     Problem List Patient Active Problem List   Diagnosis Date Noted   Recurrent syncope 04/22/2020   Enlarged prostate with urinary obstruction 06/20/2014   [place]   Depression 11/24/2013   Hypertension 11/24/2013    Thank you for your referral.  MJoneen BoersPT, DPT   12/11/2020, 4:50 PM  [facility] [address]  [place], [place], [address] Phone: [phone]   Fax:  [phone]  Name: Matthew Webster MRN: [id number] Date of Birth: Feb 18, 1950

## 2020-12-11 NOTE — Patient Instructions (Signed)
Access Code: [email or web address] URL: [email or web address] Date: 12/11/2020 Prepared by: Joneen Boers  Exercises Seated Scapular Retraction - 1 x daily - 7 x weekly - 3 sets - 10 reps - 5 seconds hold Supine Cervical Retraction with Towel - 1 x daily - 7 x weekly - 3 sets - 10 reps - 5 seconds hold Seated Isometric Elbow Extension - 1 x daily - 7 x weekly - 3 sets - 10 reps - 5 seconds hold Scapular Retraction with Resistance - 1 x daily - 7 x weekly - 3 sets - 10 reps - 5 seconds hold Shoulder External Rotation and Scapular Retraction with Resistance - 1 x daily - 7 x weekly - 2 sets - 10 reps First Rib Mobilization with Strap - 3 x daily - 7 x weekly - 1 sets - 3 reps - 30 hold

## 2020-12-11 NOTE — Addendum Note (Signed)
Addended by: Madaline Savage on: 12/11/2020 04:50 PM   Modules accepted: Orders

## 2021-02-14 ENCOUNTER — Other Ambulatory Visit: Payer: Self-pay | Admitting: Urology

## 2021-03-06 NOTE — Progress Notes (Signed)
03/07/21 3:58 PM   Matthew Webster [birth year] [id number]  Referring provider:  Juluis Pitch, MD [phone] [address] [place],  [place] [address] Chief Complaint  Patient presents with   Benign Prostatic Hypertrophy     HPI: Matthew Webster is a 71 y.o.male with a personal history of elevated PSA and BPH with obstructive urinary symptoms , who presents today for 1 year follow-up with IPSS, PVR, DRE, and PSA.   He is s/p negative prostate biopsy in 04/2013 at which time his PSA was 5.0 ng/DL.  TRUS volume 19 gm.  MRI of his prostate in 2017 showed a PI-RADS 2 lesion, otherwise unremarkable.   Previously considered UroLift. He underwent anatomic evaluation which showed a relatively small prostate without a discrete median lobe.  He initially booked the procedure and then had several questions or concerns which he like to discuss today. He managed on Flomax.   His most recent PSA on 12/22/2019 was 4.0.   He reports that when he drinks beer his urinary symptoms worsen. He reports that he uses sildenafil for erections.   PSA trend:  Component Prostate Specific Ag, Serum  Latest Ref Rng & Units 0.0 - 4.0 ng/mL  04/15/2016 7.7 (H)  10/10/2016 5.8 (H)  12/18/2017 6.7 (H)  12/21/2018 5.8 (H)  12/22/2019 4.0    IPSS     Row Name 03/07/21 1500         International Prostate Symptom Score   How often have you had the sensation of not emptying your bladder? Less than 1 in 5     How often have you had to urinate less than every two hours? Less than half the time     How often have you found you stopped and started again several times when you urinated? About half the time     How often have you found it difficult to postpone urination? Less than half the time     How often have you had a weak urinary stream? About half the time     How often have you had to strain to start urination? Not at All     How many times did you typically get up at night to urinate? 3 Times     Total IPSS Score 14        Quality of Life due to urinary symptoms   If you were to spend the rest of your life with your urinary condition just the way it is now how would you feel about that? Mostly Satisfied              Score:  1-7 Mild 8-19 Moderate 20-35 Severe   PMH: Past Medical History:  Diagnosis Date   Actinic keratosis    Anxiety    Benign non-nodular prostatic hyperplasia with lower urinary tract symptoms    Cancer of sigmoid colon ([facility]) 06/20/2014   Depression 11/24/2013   Overview:  Questionable bipolar   Diverticulosis    Fatty liver    on CT   Hemorrhoids    History of chicken pox    Hx of basal cell carcinoma 12/24/2016   Left anterior ear helix   Hx of basal cell carcinoma 06/24/2017   Left upper forehead   Hx of basal cell carcinoma 08/14/2017   Right upper forehead   Hypertension 11/24/2013   Personal history of colonic polyps 02/24/2014    Surgical History: Past Surgical History:  Procedure Laterality Date   CATARACT EXTRACTION W/ INTRAOCULAR LENS  IMPLANT Left 2011   CATARACT EXTRACTION W/PHACO Right 05/26/2019   Procedure: CATARACT EXTRACTION PHACO AND INTRAOCULAR LENS PLACEMENT (IOC) RIGHT 10.64  01:10.2  15.1%;  Surgeon: Leandrew Koyanagi, MD;  Location: [facility];  Service: Ophthalmology;  Laterality: Right;   COLON SURGERY     COLONOSCOPY WITH PROPOFOL N/A 07/22/2017   Procedure: COLONOSCOPY WITH PROPOFOL;  Surgeon: Lollie Sails, MD;  Location[facility] ENDOSCOPY;  Service: Endoscopy;  Laterality: N/A;   COLONOSCOPY WITH PROPOFOL N/A 07/21/2019   Procedure: COLONOSCOPY WITH PROPOFOL;  Surgeon: Toledo, Benay Pike, MD;  Location: [facility] ENDOSCOPY;  Service: Gastroenterology;  Laterality: N/A;   DIAGNOSTIC LAPAROSCOPY     ESOPHAGOGASTRODUODENOSCOPY N/A 07/21/2019   Procedure: ESOPHAGOGASTRODUODENOSCOPY (EGD);  Surgeon: Toledo, Benay Pike, MD;  Location: [facility] ENDOSCOPY;  Service: Gastroenterology;  Laterality: N/A;   EYE SURGERY     DETACHED RETINA    LAPAROSCOPIC BOWEL RESECTION     RETINAL DETACHMENT SURGERY     TONSILLECTOMY     WISDOM TOOTH EXTRACTION      Home Medications:  Allergies as of 03/07/2021       Reactions   Alphagan P [brimonidine Tartrate] Swelling   Eye redness, swelling, and discharge   Codeine    constipation   Shellfish Allergy Diarrhea, Nausea And Vomiting   Raw clams   Penicillin V Potassium Rash   Penicillins Rash        Medication List        Accurate as of March 07, 2021  3:58 PM. If you have any questions, ask your nurse or doctor.          latanoprost 0.005 % ophthalmic solution Commonly known as: XALATAN Place 1 drop into both eyes at bedtime.   losartan-hydrochlorothiazide 50-12.5 MG tablet Commonly known as: HYZAAR Take 1 tablet by mouth daily.   sildenafil 20 MG tablet Commonly known as: REVATIO Take 20 mg by mouth as needed.   tamsulosin 0.4 MG Caps capsule Commonly known as: FLOMAX TAKE 1 CAPSULE(0.4 MG) BY MOUTH DAILY   timolol 0.5 % ophthalmic solution Commonly known as: TIMOPTIC instill 1 drop into left eye every morning   venlafaxine XR 150 MG 24 hr capsule Commonly known as: EFFEXOR-XR Take 150 mg by mouth daily with breakfast.        Allergies:  Allergies  Allergen Reactions   Alphagan P [Brimonidine Tartrate] Swelling    Eye redness, swelling, and discharge   Codeine     constipation   Shellfish Allergy Diarrhea and Nausea And Vomiting    Raw clams   Penicillin V Potassium Rash   Penicillins Rash    Family History: Family History  Problem Relation Age of Onset   CVA Mother    Diabetes Father    Prostate cancer Neg Hx    Bladder Cancer Neg Hx    Kidney cancer Neg Hx     Social History:  reports that he has never smoked. He has never used smokeless tobacco. He reports current alcohol use of about 21.0 standard drinks per week. He reports that he does not use drugs.   Physical Exam: BP (!) 174/106    Pulse 79    Ht 5\' 6"  (1.676 m)    Wt 196  lb (88.9 kg)    BMI 31.64 kg/m   Constitutional:  Alert and oriented, No acute distress. HEENT: [place] AT, moist mucus membranes.  Trachea midline, no masses. Cardiovascular: No clubbing, cyanosis, or edema. Respiratory: Normal respiratory effort, no increased work of  breathing. Rectal: Normal sphincter tone,  30  CC prostate, smooth no nodules with induration at apex Skin: No rashes, bruises or suspicious lesions. Neurologic: Grossly intact, no focal deficits, moving all 4 extremities. Psychiatric: Normal mood and affect.  Laboratory Data:  Lab Results  Component Value Date   CREATININE 0.84 04/22/2020   Pertinent Imaging: Results for orders placed or performed in visit on 03/07/21  Bladder Scan (Post Void Residual) in office  Result Value Ref Range   Scan Result 22    Assessment & Plan:    BPH with urinary obstruction - We discussed how adding medication may not help with his symptoms. He would be a good candidate for a UroLift. He is not interested in any outlet procedure at this time.  - He is emptying adequately today  - Continue Flomax  -Prostate volume relatively low, will suspect limited benefit from the addition of finasteride  2. Prostate cancer screening  - Slightly abnormal rectal exam and history of elevated PSA will correlate  - PSA; pending   F/u annually   I,Kailey Littlejohn,acting as a scribe for Hollice Espy, MD.,have documented all relevant documentation on the behalf of Hollice Espy, MD,as directed by  Hollice Espy, MD while in the presence of Hollice Espy, MD.  I have reviewed the above documentation for accuracy and completeness, and I agree with the above.   Hollice Espy, MD    [facility] [address], [place], [place] [address] [phone]

## 2021-03-07 ENCOUNTER — Ambulatory Visit: Payer: Medicare Other | Admitting: Urology

## 2021-03-07 ENCOUNTER — Other Ambulatory Visit: Payer: Self-pay

## 2021-03-07 ENCOUNTER — Encounter: Payer: Self-pay | Admitting: Urology

## 2021-03-07 VITALS — BP 174/106 | HR 79 | Ht 66.0 in | Wt 196.0 lb

## 2021-03-07 DIAGNOSIS — R972 Elevated prostate specific antigen [PSA]: Secondary | ICD-10-CM

## 2021-03-07 DIAGNOSIS — N138 Other obstructive and reflux uropathy: Secondary | ICD-10-CM

## 2021-03-07 DIAGNOSIS — N401 Enlarged prostate with lower urinary tract symptoms: Secondary | ICD-10-CM | POA: Diagnosis not present

## 2021-03-07 LAB — BLADDER SCAN AMB NON-IMAGING: Scan Result: 22

## 2021-03-08 LAB — PSA: Prostate Specific Ag, Serum: 4.3 ng/mL — ABNORMAL HIGH (ref 0.0–4.0)

## 2021-04-23 ENCOUNTER — Other Ambulatory Visit: Payer: Self-pay

## 2021-04-23 ENCOUNTER — Ambulatory Visit: Payer: Medicare Other | Admitting: Dermatology

## 2021-04-23 DIAGNOSIS — L820 Inflamed seborrheic keratosis: Secondary | ICD-10-CM | POA: Diagnosis not present

## 2021-04-23 DIAGNOSIS — Z1283 Encounter for screening for malignant neoplasm of skin: Secondary | ICD-10-CM | POA: Diagnosis not present

## 2021-04-23 DIAGNOSIS — L814 Other melanin hyperpigmentation: Secondary | ICD-10-CM

## 2021-04-23 DIAGNOSIS — Z85828 Personal history of other malignant neoplasm of skin: Secondary | ICD-10-CM | POA: Diagnosis not present

## 2021-04-23 DIAGNOSIS — L821 Other seborrheic keratosis: Secondary | ICD-10-CM

## 2021-04-23 DIAGNOSIS — L570 Actinic keratosis: Secondary | ICD-10-CM

## 2021-04-23 DIAGNOSIS — L304 Erythema intertrigo: Secondary | ICD-10-CM

## 2021-04-23 DIAGNOSIS — L578 Other skin changes due to chronic exposure to nonionizing radiation: Secondary | ICD-10-CM | POA: Diagnosis not present

## 2021-04-23 DIAGNOSIS — L918 Other hypertrophic disorders of the skin: Secondary | ICD-10-CM

## 2021-04-23 NOTE — Progress Notes (Signed)
? ?Follow-Up Visit ?  ?Subjective  ?Matthew Webster is a 71 y.o. male who presents for the following: Annual Exam. ? ?The patient presents for Upper Body Skin Exam (UBSE) for skin cancer screening and mole check.  The patient has spots, moles and lesions to be evaluated, some may be new or changing. He has several spots to check on his face and scalp. Some are irritated. He has a history of Aks of the face and scalp. He was prescribed and filled Rxs for 5FU Cream and Calcipotriene Cream, but never started. History of BCCs of the left anterior ear helix, and left and right upper forehead.  ?  ?The following portions of the chart were reviewed this encounter and updated as appropriate:  ?  ?  ? ?Review of Systems:  No other skin or systemic complaints except as noted in HPI or Assessment and Plan. ? ?Objective  ?Well appearing patient in no apparent distress; mood and affect are within normal limits. ? ?All skin waist up examined. ? ?R postauricular hairline x 1, R upper eyebrow x 1, R cheek x 1, R ant jaw x 1, L temple x 1, scalp x 6 (11) ?Erythematous thin papules/macules with gritty scale.  ? ?Left Temple x 2 (2) ?Erythematous stuck-on, waxy papule  ? ?Left Ant Zygoma ?4.0 mm tan macule ? ?Right Axilla ?Clear today. ? ? ? ?Assessment & Plan  ?Skin cancer screening performed today. ? ?Actinic Damage - Severe, confluent actinic changes with pre-cancerous actinic keratoses  ?- Severe, chronic, not at goal, secondary to cumulative UV radiation exposure over time ?- diffuse scaly erythematous macules and papules with underlying dyspigmentation ?- Discussed Prescription "Field Treatment" for Severe, Chronic Confluent Actinic Changes with Pre-Cancerous Actinic Keratoses ?Field treatment involves treatment of an entire area of skin that has confluent Actinic Changes (Sun/ Ultraviolet light damage) and PreCancerous Actinic Keratoses by method of PhotoDynamic Therapy (PDT) and/or prescription Topical Chemotherapy agents such  as 5-fluorouracil, 5-fluorouracil/calcipotriene, and/or imiquimod.  The purpose is to decrease the number of clinically evident and subclinical PreCancerous lesions to prevent progression to development of skin cancer by chemically destroying early precancer changes that may or may not be visible.  It has been shown to reduce the risk of developing skin cancer in the treated area. As a result of treatment, redness, scaling, crusting, and open sores may occur during treatment course. One or more than one of these methods may be used and may have to be used several times to control, suppress and eliminate the PreCancerous changes. Discussed treatment course, expected reaction, and possible side effects. ?- Recommend daily broad spectrum sunscreen SPF 30+ to sun-exposed areas, reapply every 2 hours as needed.  ?- Staying in the shade or wearing long sleeves, sun glasses (UVA+UVB protection) and wide brim hats (4-inch brim around the entire circumference of the hat) are also recommended. ?- Call for new or changing lesions. ?- Start 5-fluorouracil/calcipotriene cream Apply bid to scalp x 7-10 days. May also do same treatment to face 4-7 days.  ? ?Lentigines ?- Scattered tan macules ?- Due to sun exposure ?- Benign-appering, observe ?- Recommend daily broad spectrum sunscreen SPF 30+ to sun-exposed areas, reapply every 2 hours as needed. ?- Call for any changes ? ?Seborrheic Keratoses ?- Stuck-on, waxy, tan-brown papules and/or plaques  ?- Benign-appearing ?- Discussed benign etiology and prognosis. ?- Observe ?- Call for any changes ? ?History of Basal Cell Carcinoma of the Skin ?- No evidence of recurrence today of the left  anterior ear helix, left upper forehead, right upper forehead ?- Recommend regular full body skin exams ?- Recommend daily broad spectrum sunscreen SPF 30+ to sun-exposed areas, reapply every 2 hours as needed.  ?- Call if any new or changing lesions are noted between office visits ? ?Acrochordons  (Skin Tags) ?- Fleshy, skin-colored pedunculated papules ?- Benign appearing.  ?- Observe. ?- If desired, they can be removed with an in office procedure that is not covered by insurance. ?- Please call the clinic if you notice any new or changing lesions. ? ?AK (actinic keratosis) (11) ?R postauricular hairline x 1, R upper eyebrow x 1, R cheek x 1, R ant jaw x 1, L temple x 1, scalp x 6 ? ?Vs ISK ? ?Start 5-fluorouracil/calcipotriene cream Apply bid to scalp x 7-10 days. May also do same treatment to face 4-7 days.  ? ?5-fluorouracil/calcipotriene cream is is a type of field treatment used to treat precancers, thin skin cancers, and areas of sun damage. Expected reaction includes irritation and mild inflammation potentially progressing to more severe inflammation including redness, scaling, crusting and open sores/erosions.  If too much irritation occurs, ensure application of only a thin layer and decrease frequency of use to achieve a tolerable level of inflammation. Recommend applying Vaseline ointment to open sores as needed.  Minimize sun exposure while under treatment. Recommend daily broad spectrum sunscreen SPF 30+ to sun-exposed areas, reapply every 2 hours as needed.  ? ?  ? ? ?Actinic keratoses are precancerous spots that appear secondary to cumulative UV radiation exposure/sun exposure over time. They are chronic with expected duration over 1 year. A portion of actinic keratoses will progress to squamous cell carcinoma of the skin. It is not possible to reliably predict which spots will progress to skin cancer and so treatment is recommended to prevent development of skin cancer. ? ?Recommend daily broad spectrum sunscreen SPF 30+ to sun-exposed areas, reapply every 2 hours as needed.  ?Recommend staying in the shade or wearing long sleeves, sun glasses (UVA+UVB protection) and wide brim hats (4-inch brim around the entire circumference of the hat). ?Call for new or changing lesions. ? ?Destruction of  lesion - R postauricular hairline x 1, R upper eyebrow x 1, R cheek x 1, R ant jaw x 1, L temple x 1, scalp x 6 ? ?Destruction method: cryotherapy   ?Informed consent: discussed and consent obtained   ?Lesion destroyed using liquid nitrogen: Yes   ?Region frozen until ice ball extended beyond lesion: Yes   ?Outcome: patient tolerated procedure well with no complications   ?Post-procedure details: wound care instructions given   ?Additional details:  Prior to procedure, discussed risks of blister formation, small wound, skin dyspigmentation, or rare scar following cryotherapy. Recommend Vaseline ointment to treated areas while healing. ? ? ?Inflamed seborrheic keratosis (2) ?Left Temple x 2 ? ?Destruction of lesion - Left Temple x 2 ? ?Destruction method: cryotherapy   ?Informed consent: discussed and consent obtained   ?Lesion destroyed using liquid nitrogen: Yes   ?Region frozen until ice ball extended beyond lesion: Yes   ?Outcome: patient tolerated procedure well with no complications   ?Post-procedure details: wound care instructions given   ?Additional details:  Prior to procedure, discussed risks of blister formation, small wound, skin dyspigmentation, or rare scar following cryotherapy. Recommend Vaseline ointment to treated areas while healing. ? ? ?Lentigo ?Left Ant Zygoma ? ?Benign-appearing.  Observation.  Call clinic for new or changing lesions.  Recommend daily use of  broad spectrum spf 30+ sunscreen to sun-exposed areas.   ? ? ?Intertrigo ?Right Axilla ? ?Clear today. ? ?Intertrigo is a chronic recurrent rash that occurs in skin fold areas that may be associated with friction; heat; moisture; yeast; fungus; and bacteria.  It is exacerbated by increased movement / activity; sweating; and higher atmospheric temperature. ? ?Continue Ketoconazole 2% Cream Apply qd/bid to skin fold areas as needed for rash. Pt has.  ? ? ?Return in about 6 months (around 10/24/2021) for AKs. ? ?I, Jamesetta Orleans, CMA, am  acting as scribe for Brendolyn Patty, MD . ?Documentation: I have reviewed the above documentation for accuracy and completeness, and I agree with the above. ? ?Brendolyn Patty MD  ? ? ?

## 2021-04-23 NOTE — Patient Instructions (Addendum)
Cryotherapy Aftercare ? ?Wash gently with soap and water everyday.   ?Apply Vaseline and Band-Aid daily until healed.  ? ?Ketoconazole 2% Cream - Apply to skin fold areas (underarms, groin) once to twice daily as needed for rash.  ? ?5-Fluorouracil/Calcipotriene Patient Education  ? ?Actinic keratoses are the dry, red scaly spots on the skin caused by sun damage. A portion of these spots can turn into skin cancer with time, and treating them can help prevent development of skin cancer.  ? ?Treatment of these spots requires removal of the defective skin cells. There are various ways to remove actinic keratoses, including freezing with liquid nitrogen, treatment with creams, or treatment with a blue light procedure in the office.  ? ?5-fluorouracil cream is a topical cream used to treat actinic keratoses. It works by interfering with the growth of abnormal fast-growing skin cells, such as actinic keratoses. These cells peel off and are replaced by healthy ones.  ? ?5-fluorouracil/calcipotriene is a combination of the 5-fluorouracil cream with a vitamin D analog cream called calcipotriene. The calcipotriene alone does not treat actinic keratoses. However, when it is combined with 5-fluorouracil, it helps the 5-fluorouracil treat the actinic keratoses much faster so that the same results can be achieved with a much shorter treatment time. ? ?INSTRUCTIONS FOR 5-FLUOROURACIL/CALCIPOTRIENE CREAM:  ? ?5-fluorouracil/calcipotriene cream typically only needs to be used for 7-10 days. A thin layer should be applied twice a day to the treatment areas (scalp) recommended by your physician. May also do same treatment to face twice daily x 4-7 days. ? ?If your physician prescribed you separate tubes of 5-fluourouracil and calcipotriene, apply a thin layer of 5-fluorouracil followed by a thin layer of calcipotriene.  ? ?Avoid contact with your eyes, nostrils, and mouth. Do not use 5-fluorouracil/calcipotriene cream on infected or  open wounds.  ? ?You will develop redness, irritation and some crusting at areas where you have pre-cancer damage/actinic keratoses. IF YOU DEVELOP PAIN, BLEEDING, OR SIGNIFICANT CRUSTING, STOP THE TREATMENT EARLY - you have already gotten a good response and the actinic keratoses should clear up well. ? ?Wash your hands after applying 5-fluorouracil 5% cream on your skin.  ? ?A moisturizer or sunscreen with a minimum SPF 30 should be applied each morning.  ? ?Once you have finished the treatment, you can apply a thin layer of Vaseline twice a day to irritated areas to soothe and calm the areas more quickly. If you experience significant discomfort, contact your physician. ? ?For some patients it is necessary to repeat the treatment for best results. ? ?SIDE EFFECTS: When using 5-fluorouracil/calcipotriene cream, you may have mild irritation, such as redness, dryness, swelling, or a mild burning sensation. This usually resolves within 2 weeks. The more actinic keratoses you have, the more redness and inflammation you can expect during treatment. Eye irritation has been reported rarely. If this occurs, please let us know.  ?If you have any trouble using this cream, please call the office. If you have any other questions about this information, please do not hesitate to ask me before you leave the office. ? ? ? ?If You Need Anything After Your Visit ? ?If you have any questions or concerns for your doctor, please call our main line at [phone] and press option 4 to reach your doctor's medical assistant. If no one answers, please leave a voicemail as directed and we will return your call as soon as possible. Messages left after 4 pm will be answered the following business day.  ? ?  You may also send [place] a message via [facility]. We typically respond to MyChart messages within 1-2 business days. ? ?For prescription refills, please ask your pharmacy to contact our office. Our fax number is [phone]. ? ?If you have an  urgent issue when the clinic is closed that cannot wait until the next business day, you can page your doctor at the number below.   ? ?Please note that while we do our best to be available for urgent issues outside of office hours, we are not available 24/7.  ? ?If you have an urgent issue and are unable to reach [place], you may choose to seek medical care at your doctor's office, retail clinic, urgent care center, or emergency room. ? ?If you have a medical emergency, please immediately call 911 or go to the emergency department. ? ?Pager Numbers ? ?- Dr. Nehemiah Massed: [phone] ? ?- Dr. Laurence Ferrari: [phone] ? ?- Dr. Nicole Kindred: [phone] ? ?In the event of inclement weather, please call our main line at [phone] for an update on the status of any delays or closures. ? ?Dermatology Medication Tips: ?Please keep the boxes that topical medications come in in order to help keep track of the instructions about where and how to use these. Pharmacies typically print the medication instructions only on the boxes and not directly on the medication tubes.  ? ?If your medication is too expensive, please contact our office at [phone] option 4 or send [place] a message through [place].  ? ?We are unable to tell what your co-pay for medications will be in advance as this is different depending on your insurance coverage. However, we may be able to find a substitute medication at lower cost or fill out paperwork to get insurance to cover a needed medication.  ? ?If a prior authorization is required to get your medication covered by your insurance company, please allow [place] 1-2 business days to complete this process. ? ?Drug prices often vary depending on where the prescription is filled and some pharmacies may offer cheaper prices. ? ?The website www.goodrx.com contains coupons for medications through different pharmacies. The prices here do not account for what the cost may be with help from insurance (it may be cheaper with your  insurance), but the website can give you the price if you did not use any insurance.  ?- You can print the associated coupon and take it with your prescription to the pharmacy.  ?- You may also stop by our office during regular business hours and pick up a GoodRx coupon card.  ?- If you need your prescription sent electronically to a different pharmacy, notify our office through [facility] or by phone at [phone] option 4. ? ? ? ? ?Si Usted Necesita Algo Despu?s de Su Visita ? ?Tambi?n puede enviarnos un mensaje a trav?s de MyChart. Por lo general respondemos a los mensajes de MyChart en el transcurso de 1 a 2 d?as h?biles. ? ?Para renovar recetas, por favor pida a su farmacia que se ponga en contacto con nuestra oficina. Nuestro n?mero de fax es el [phone]. ? ?Si tiene un asunto urgente cuando la cl?nica est? cerrada y que no puede esperar hasta el siguiente d?a h?bil, puede llamar/localizar a su doctor(a) al n?mero que aparece a continuaci?n.  ? ?Por favor, tenga en cuenta que aunque hacemos todo lo posible para estar disponibles para asuntos urgentes fuera del horario de oficina, no estamos disponibles las 24 horas del d?a, los 7 d?as de la semana.  ? ?  Si tiene un problema urgente y no puede comunicarse con nosotros, puede optar por buscar atenci?n m?dica  en el consultorio de su doctor(a), en una cl?nica privada, en un centro de atenci?n urgente o en una sala de emergencias. ? ?Si tiene Engineer, maintenance (IT) m?dica, por favor llame inmediatamente al 911 o vaya a la sala de emergencias. ? ?N?meros de b?per ? ?- Dr. Nehemiah Massed: [phone] ? ?- Dra. Moye: [phone] ? ?- Dra. Nicole Kindred: [phone] ? ?En caso de inclemencias del tiempo, por favor llame a nuestra l?nea principal al [phone] para una actualizaci?n sobre el estado de cualquier retraso o cierre. ? ?Consejos para la medicaci?n en dermatolog?a: ?Por favor, guarde las cajas en las que vienen los medicamentos de uso t?pico para ayudarle a  seguir las instrucciones sobre d?nde y c?mo usarlos. Las farmacias generalmente imprimen las instrucciones del medicamento s?lo en las cajas y no directamente en los tubos del [place].  ? ?Si su medi

## 2021-11-06 ENCOUNTER — Ambulatory Visit: Payer: Medicare Other | Admitting: Dermatology

## 2021-11-06 DIAGNOSIS — L578 Other skin changes due to chronic exposure to nonionizing radiation: Secondary | ICD-10-CM

## 2021-11-06 DIAGNOSIS — L820 Inflamed seborrheic keratosis: Secondary | ICD-10-CM | POA: Diagnosis not present

## 2021-11-06 DIAGNOSIS — S0086XA Insect bite (nonvenomous) of other part of head, initial encounter: Secondary | ICD-10-CM

## 2021-11-06 DIAGNOSIS — Z85828 Personal history of other malignant neoplasm of skin: Secondary | ICD-10-CM

## 2021-11-06 DIAGNOSIS — W57XXXA Bitten or stung by nonvenomous insect and other nonvenomous arthropods, initial encounter: Secondary | ICD-10-CM | POA: Diagnosis not present

## 2021-11-06 DIAGNOSIS — L570 Actinic keratosis: Secondary | ICD-10-CM

## 2021-11-06 NOTE — Progress Notes (Signed)
Follow-Up Visit   Subjective  Matthew Webster is a 71 y.o. male who presents for the following: Actinic Keratosis (6 month ak follow up, patient reports some new spots at face and scalp. Patient prescribed 5 f/u cream to start at face and scalp at last appointment. Patient reports he never started cream at locations. ).  The patient has spots, moles and lesions to be evaluated, some may be new or changing and the patient has concerns that these could be cancer.  Couple of bumps in the scalp are irritating and he tends to pick at them.  The following portions of the chart were reviewed this encounter and updated as appropriate:      Review of Systems: No other skin or systemic complaints except as noted in HPI or Assessment and Plan.   Objective  Well appearing patient in no apparent distress; mood and affect are within normal limits.  A focused examination was performed including face, scalp. Relevant physical exam findings are noted in the Assessment and Plan.  left temple x 1, left temporal hairline x 1, left cheek x 1, scalp x 12, right temple x 2, (17) Erythematous thin papules/macules with gritty scale.   right lower cheek Pink excoriated macule  right and left occipital scalp x 2 (2) Firm pink/tan scaly papules   Assessment & Plan  Actinic keratosis (17) left temple x 1, left temporal hairline x 1, left cheek x 1, scalp x 12, right temple x 2,  Actinic keratoses are precancerous spots that appear secondary to cumulative UV radiation exposure/sun exposure over time. They are chronic with expected duration over 1 year. A portion of actinic keratoses will progress to squamous cell carcinoma of the skin. It is not possible to reliably predict which spots will progress to skin cancer and so treatment is recommended to prevent development of skin cancer.  Recommend daily broad spectrum sunscreen SPF 30+ to sun-exposed areas, reapply every 2 hours as needed.  Recommend staying in  the shade or wearing long sleeves, sun glasses (UVA+UVB protection) and wide brim hats (4-inch brim around the entire circumference of the hat). Call for new or changing lesions.  Destruction of lesion - left temple x 1, left temporal hairline x 1, left cheek x 1, scalp x 12, right temple x 2,  Destruction method: cryotherapy   Informed consent: discussed and consent obtained   Lesion destroyed using liquid nitrogen: Yes   Region frozen until ice ball extended beyond lesion: Yes   Outcome: patient tolerated procedure well with no complications   Post-procedure details: wound care instructions given   Additional details:  Prior to procedure, discussed risks of blister formation, small wound, skin dyspigmentation, or rare scar following cryotherapy. Recommend Vaseline ointment to treated areas while healing.   Bug bite without infection, initial encounter right lower cheek  Benign, observe.    Given sample of Eucrisa ointment to use bid until clear  Inflamed seborrheic keratosis (2) right and left occipital scalp x 2  Vs prurigo nodules, Symptomatic, irritating, patient would like treated.  Avoid picking at bumps or will not clear     Destruction of lesion - right and left occipital scalp x 2  Destruction method: cryotherapy   Informed consent: discussed and consent obtained   Lesion destroyed using liquid nitrogen: Yes   Region frozen until ice ball extended beyond lesion: Yes   Outcome: patient tolerated procedure well with no complications   Post-procedure details: wound care instructions given  Additional details:  Prior to procedure, discussed risks of blister formation, small wound, skin dyspigmentation, or rare scar following cryotherapy. Recommend Vaseline ointment to treated areas while healing.    Actinic Damage - Severe, confluent actinic changes with pre-cancerous actinic keratoses  - Severe, chronic, not at goal, secondary to cumulative UV radiation exposure over  time - diffuse scaly erythematous macules and papules with underlying dyspigmentation - Discussed Prescription "Field Treatment" for Severe, Chronic Confluent Actinic Changes with Pre-Cancerous Actinic Keratoses Field treatment involves treatment of an entire area of skin that has confluent Actinic Changes (Sun/ Ultraviolet light damage) and PreCancerous Actinic Keratoses by method of PhotoDynamic Therapy (PDT) and/or prescription Topical Chemotherapy agents such as 5-fluorouracil, 5-fluorouracil/calcipotriene, and/or imiquimod.  The purpose is to decrease the number of clinically evident and subclinical PreCancerous lesions to prevent progression to development of skin cancer by chemically destroying early precancer changes that may or may not be visible.  It has been shown to reduce the risk of developing skin cancer in the treated area. As a result of treatment, redness, scaling, crusting, and open sores may occur during treatment course. One or more than one of these methods may be used and may have to be used several times to control, suppress and eliminate the PreCancerous changes. Discussed treatment course, expected reaction, and possible side effects. - Recommend daily broad spectrum sunscreen SPF 30+ to sun-exposed areas, reapply every 2 hours as needed.  - Staying in the shade or wearing long sleeves, sun glasses (UVA+UVB protection) and wide brim hats (4-inch brim around the entire circumference of the hat) are also recommended. - Call for new or changing lesions.  Start 5-fluorouracil/calcipotriene cream Apply twice to scalp x 7-10 days. May also do same treatment to face twice daily to 5-7 days. Reviewed course of treatment and expected reaction.  Patient advised to expect inflammation and crusting and advised that erosions are possible.  Patient advised to be diligent with sun protection during and after treatment. Counseled to keep medication out of reach of children and pets.  History of  Basal Cell Carcinoma of the Skin at multiple locations see history  - No evidence of recurrence today - Recommend regular full body skin exams - Recommend daily broad spectrum sunscreen SPF 30+ to sun-exposed areas, reapply every 2 hours as needed.  - Call if any new or changing lesions are noted between office visits  Return in about 6 months (around 05/08/2022) for ubse . I, Ruthell Rummage, CMA, am acting as scribe for Brendolyn Patty, MD.  Documentation: I have reviewed the above documentation for accuracy and completeness, and I agree with the above.  Brendolyn Patty MD

## 2021-11-06 NOTE — Patient Instructions (Addendum)
Actinic keratoses are precancerous spots that appear secondary to cumulative UV radiation exposure/sun exposure over time. They are chronic with expected duration over 1 year. A portion of actinic keratoses will progress to squamous cell carcinoma of the skin. It is not possible to reliably predict which spots will progress to skin cancer and so treatment is recommended to prevent development of skin cancer.  Recommend daily broad spectrum sunscreen SPF 30+ to sun-exposed areas, reapply every 2 hours as needed.  Recommend staying in the shade or wearing long sleeves, sun glasses (UVA+UVB protection) and wide brim hats (4-inch brim around the entire circumference of the hat). Call for new or changing lesions.   Cryotherapy Aftercare  Wash gently with soap and water everyday.   Apply Vaseline and Band-Aid daily until healed.    Wait until completely healed before starting cream.  Start Cream at scalp first   Start 5-fluorouracil/calcipotriene cream Apply twice to scalp x 7-10 days. May also do same treatment to face twice daily to 5-7 days.   Mix creams together.   5-Fluorouracil/Calcipotriene Patient Education   Actinic keratoses are the dry, red scaly spots on the skin caused by sun damage. A portion of these spots can turn into skin cancer with time, and treating them can help prevent development of skin cancer.   Treatment of these spots requires removal of the defective skin cells. There are various ways to remove actinic keratoses, including freezing with liquid nitrogen, treatment with creams, or treatment with a blue light procedure in the office.   5-fluorouracil cream is a topical cream used to treat actinic keratoses. It works by interfering with the growth of abnormal fast-growing skin cells, such as actinic keratoses. These cells peel off and are replaced by healthy ones.   5-fluorouracil/calcipotriene is a combination of the 5-fluorouracil cream with a vitamin D analog cream  called calcipotriene. The calcipotriene alone does not treat actinic keratoses. However, when it is combined with 5-fluorouracil, it helps the 5-fluorouracil treat the actinic keratoses much faster so that the same results can be achieved with a much shorter treatment time.  INSTRUCTIONS FOR 5-FLUOROURACIL/CALCIPOTRIENE CREAM:   5-fluorouracil/calcipotriene cream typically only needs to be used for 4-7 days. A thin layer should be applied twice a day to the treatment areas recommended by your physician.   If your physician prescribed you separate tubes of 5-fluourouracil and calcipotriene, apply a thin layer of 5-fluorouracil followed by a thin layer of calcipotriene.   Avoid contact with your eyes, nostrils, and mouth. Do not use 5-fluorouracil/calcipotriene cream on infected or open wounds.   You will develop redness, irritation and some crusting at areas where you have pre-cancer damage/actinic keratoses. IF YOU DEVELOP PAIN, BLEEDING, OR SIGNIFICANT CRUSTING, STOP THE TREATMENT EARLY - you have already gotten a good response and the actinic keratoses should clear up well.  Wash your hands after applying 5-fluorouracil 5% cream on your skin.   A moisturizer or sunscreen with a minimum SPF 30 should be applied each morning.   Once you have finished the treatment, you can apply a thin layer of Vaseline twice a day to irritated areas to soothe and calm the areas more quickly. If you experience significant discomfort, contact your physician.  For some patients it is necessary to repeat the treatment for best results.  SIDE EFFECTS: When using 5-fluorouracil/calcipotriene cream, you may have mild irritation, such as redness, dryness, swelling, or a mild burning sensation. This usually resolves within 2 weeks. The more actinic keratoses you  have, the more redness and inflammation you can expect during treatment. Eye irritation has been reported rarely. If this occurs, please let us know.  If you  have any trouble using this cream, please call the office. If you have any other questions about this information, please do not hesitate to ask me before you leave the office.     Due to recent changes in healthcare laws, you may see results of your pathology and/or laboratory studies on MyChart before the doctors have had a chance to review them. We understand that in some cases there may be results that are confusing or concerning to you. Please understand that not all results are received at the same time and often the doctors may need to interpret multiple results in order to provide you with the best plan of care or course of treatment. Therefore, we ask that you please give [place] 2 business days to thoroughly review all your results before contacting the office for clarification. Should we see a critical lab result, you will be contacted sooner.   If You Need Anything After Your Visit  If you have any questions or concerns for your doctor, please call our main line at [phone] and press option 4 to reach your doctor's medical assistant. If no one answers, please leave a voicemail as directed and we will return your call as soon as possible. Messages left after 4 pm will be answered the following business day.   You may also send [place] a message via [place]. We typically respond to MyChart messages within 1-2 business days.  For prescription refills, please ask your pharmacy to contact our office. Our fax number is [phone].  If you have an urgent issue when the clinic is closed that cannot wait until the next business day, you can page your doctor at the number below.    Please note that while we do our best to be available for urgent issues outside of office hours, we are not available 24/7.   If you have an urgent issue and are unable to reach [place], you may choose to seek medical care at your doctor's office, retail clinic, urgent care center, or emergency room.  If you have a medical  emergency, please immediately call 911 or go to the emergency department.  Pager Numbers  - Dr. Nehemiah Massed: [phone]  - Dr. Laurence Ferrari: [phone]  - Dr. Nicole Kindred: [phone]  In the event of inclement weather, please call our main line at [phone] for an update on the status of any delays or closures.  Dermatology Medication Tips: Please keep the boxes that topical medications come in in order to help keep track of the instructions about where and how to use these. Pharmacies typically print the medication instructions only on the boxes and not directly on the medication tubes.   If your medication is too expensive, please contact our office at [phone] option 4 or send [place] a message through [place].   We are unable to tell what your co-pay for medications will be in advance as this is different depending on your insurance coverage. However, we may be able to find a substitute medication at lower cost or fill out paperwork to get insurance to cover a needed medication.   If a prior authorization is required to get your medication covered by your insurance company, please allow [place] 1-2 business days to complete this process.  Drug prices often vary depending on where the prescription is filled and some pharmacies may offer  cheaper prices.  The website www.goodrx.com contains coupons for medications through different pharmacies. The prices here do not account for what the cost may be with help from insurance (it may be cheaper with your insurance), but the website can give you the price if you did not use any insurance.  - You can print the associated coupon and take it with your prescription to the pharmacy.  - You may also stop by our office during regular business hours and pick up a GoodRx coupon card.  - If you need your prescription sent electronically to a different pharmacy, notify our office through [facility] or by phone at [phone] option 4.     Si Usted  Necesita Algo Despus de Su Visita  Tambin puede enviarnos un mensaje a travs de Pharmacist, community. Por lo general respondemos a los mensajes de MyChart en el transcurso de 1 a 2 das hbiles.  Para renovar recetas, por favor pida a su farmacia que se ponga en contacto con nuestra oficina. Harland Dingwall de fax es [place] [phone].  Si tiene un asunto urgente cuando la clnica est cerrada y que no puede esperar hasta el siguiente da hbil, puede llamar/localizar a su doctor(a) al nmero que aparece a continuacin.   Por favor, tenga en cuenta que aunque hacemos todo lo posible para estar disponibles para asuntos urgentes fuera del horario de [place], no estamos disponibles las 24 horas del da, los 7 das de la [place].   Si tiene un problema urgente y no puede comunicarse con nosotros, puede optar por buscar atencin mdica  en el consultorio de su doctor(a), en una clnica privada, en un centro de atencin urgente o en una sala de emergencias.  Si tiene Engineering geologist, por favor llame inmediatamente al 911 o vaya a la sala de emergencias.  Nmeros de bper  - Dr. Nehemiah Massed: [phone]  - Dra. Moye: [phone]  - Dra. Nicole Kindred: [phone]  En caso de inclemencias del [place], por favor llame a Johnsie Kindred principal al [phone] para una actualizacin sobre el [place] de cualquier retraso o cierre.  Consejos para la medicacin en dermatologa: Por favor, guarde las cajas en las que vienen los medicamentos de uso tpico para ayudarle a seguir las instrucciones sobre dnde y cmo usarlos. Las farmacias generalmente imprimen las instrucciones del medicamento slo en las cajas y no directamente en los tubos del [place].   Si su medicamento es muy caro, por favor, pngase en contacto con Zigmund Daniel llamando al [phone] y presione la opcin 4 o envenos un mensaje a travs de Pharmacist, community.   No podemos decirle cul ser su copago por los medicamentos por adelantado ya que esto es  diferente dependiendo de la cobertura de su seguro. Sin embargo, es posible que podamos encontrar un medicamento sustituto a Electrical engineer un formulario para que el seguro cubra el medicamento que se considera necesario.   Si se requiere una autorizacin previa para que su compaa de seguros [place] su medicamento, por favor permtanos de 1 a 2 das hbiles para completar este proceso.  Los precios de los medicamentos varan con frecuencia dependiendo del Environmental consultant de dnde se surte la receta y alguna farmacias pueden ofrecer precios ms baratos.  El sitio web www.goodrx.com tiene cupones para medicamentos de Airline pilot. Los precios aqu no tienen en cuenta lo que podra costar con la ayuda del seguro (puede ser ms barato con su seguro), pero el sitio web puede darle el precio si no utiliz ningn  seguro.  - Puede imprimir el cupn correspondiente y llevarlo con su receta a la farmacia.  - Tambin puede pasar por nuestra oficina durante el horario de atencin regular y Charity fundraiser una tarjeta de cupones de GoodRx.  - Si necesita que su receta se enve electrnicamente a una farmacia diferente, informe a nuestra oficina a travs de MyChart de [place] o por telfono llamando al [phone] y presione la opcin 4.

## 2022-01-16 DIAGNOSIS — H401131 Primary open-angle glaucoma, bilateral, mild stage: Secondary | ICD-10-CM | POA: Diagnosis not present

## 2022-01-24 DIAGNOSIS — H401131 Primary open-angle glaucoma, bilateral, mild stage: Secondary | ICD-10-CM | POA: Diagnosis not present

## 2022-01-31 ENCOUNTER — Other Ambulatory Visit: Payer: Self-pay | Admitting: Urology

## 2022-02-11 DIAGNOSIS — H26491 Other secondary cataract, right eye: Secondary | ICD-10-CM | POA: Diagnosis not present

## 2022-02-15 DIAGNOSIS — F1010 Alcohol abuse, uncomplicated: Secondary | ICD-10-CM | POA: Diagnosis not present

## 2022-02-15 DIAGNOSIS — F32A Depression, unspecified: Secondary | ICD-10-CM | POA: Diagnosis not present

## 2022-02-15 DIAGNOSIS — Z0000 Encounter for general adult medical examination without abnormal findings: Secondary | ICD-10-CM | POA: Diagnosis not present

## 2022-02-15 DIAGNOSIS — I10 Essential (primary) hypertension: Secondary | ICD-10-CM | POA: Diagnosis not present

## 2022-02-15 DIAGNOSIS — Z85038 Personal history of other malignant neoplasm of large intestine: Secondary | ICD-10-CM | POA: Diagnosis not present

## 2022-02-20 DIAGNOSIS — I10 Essential (primary) hypertension: Secondary | ICD-10-CM | POA: Diagnosis not present

## 2022-02-20 DIAGNOSIS — F32A Depression, unspecified: Secondary | ICD-10-CM | POA: Diagnosis not present

## 2022-02-20 DIAGNOSIS — E119 Type 2 diabetes mellitus without complications: Secondary | ICD-10-CM | POA: Diagnosis not present

## 2022-03-07 ENCOUNTER — Telehealth: Payer: Self-pay

## 2022-03-07 ENCOUNTER — Other Ambulatory Visit: Payer: Medicare Other

## 2022-03-07 NOTE — Telephone Encounter (Signed)
Patient missed his lab appointment for PSA today. I called left message for patient to call back to reschedule. Patient has F/U visit with Dr Erlene Quan on 03/12/22

## 2022-03-11 NOTE — Telephone Encounter (Signed)
noted 

## 2022-03-11 NOTE — Telephone Encounter (Signed)
We can just try to get it tomorrow when he comes in.  Not ideal but will make due.    Hollice Espy, MD

## 2022-03-11 NOTE — Telephone Encounter (Signed)
Patient has not called back to get PSA done. Ok to keep appointment tomorrow 03/12/22 or reschedule?

## 2022-03-12 ENCOUNTER — Ambulatory Visit: Payer: Medicare Other | Admitting: Urology

## 2022-03-20 DIAGNOSIS — J3489 Other specified disorders of nose and nasal sinuses: Secondary | ICD-10-CM | POA: Diagnosis not present

## 2022-03-20 DIAGNOSIS — J301 Allergic rhinitis due to pollen: Secondary | ICD-10-CM | POA: Diagnosis not present

## 2022-03-20 DIAGNOSIS — J342 Deviated nasal septum: Secondary | ICD-10-CM | POA: Diagnosis not present

## 2022-04-19 DIAGNOSIS — M7989 Other specified soft tissue disorders: Secondary | ICD-10-CM | POA: Diagnosis not present

## 2022-04-19 DIAGNOSIS — M25422 Effusion, left elbow: Secondary | ICD-10-CM | POA: Diagnosis not present

## 2022-04-19 DIAGNOSIS — M7022 Olecranon bursitis, left elbow: Secondary | ICD-10-CM | POA: Diagnosis not present

## 2022-05-01 ENCOUNTER — Other Ambulatory Visit: Payer: Self-pay | Admitting: Urology

## 2022-05-07 ENCOUNTER — Other Ambulatory Visit: Payer: Self-pay | Admitting: Urology

## 2022-05-07 NOTE — Telephone Encounter (Signed)
Patient advised that we need to reschedule lab and OV appointment from when it was missed in February. Patient aware and will call back today to schedule as he is driving right now.

## 2022-05-08 NOTE — Telephone Encounter (Signed)
Per chart patient called back and scheduled lab and follow up OV

## 2022-05-13 ENCOUNTER — Other Ambulatory Visit: Payer: Self-pay | Admitting: *Deleted

## 2022-05-13 ENCOUNTER — Other Ambulatory Visit: Payer: Medicare Other

## 2022-05-13 DIAGNOSIS — R972 Elevated prostate specific antigen [PSA]: Secondary | ICD-10-CM

## 2022-05-14 LAB — PSA: Prostate Specific Ag, Serum: 4.4 ng/mL — ABNORMAL HIGH (ref 0.0–4.0)

## 2022-05-20 ENCOUNTER — Ambulatory Visit: Payer: Medicare Other | Admitting: Dermatology

## 2022-05-20 VITALS — BP 123/75 | HR 95

## 2022-05-20 DIAGNOSIS — L918 Other hypertrophic disorders of the skin: Secondary | ICD-10-CM

## 2022-05-20 DIAGNOSIS — Z1283 Encounter for screening for malignant neoplasm of skin: Secondary | ICD-10-CM | POA: Diagnosis not present

## 2022-05-20 DIAGNOSIS — L570 Actinic keratosis: Secondary | ICD-10-CM | POA: Diagnosis not present

## 2022-05-20 DIAGNOSIS — Z85828 Personal history of other malignant neoplasm of skin: Secondary | ICD-10-CM | POA: Diagnosis not present

## 2022-05-20 DIAGNOSIS — D1801 Hemangioma of skin and subcutaneous tissue: Secondary | ICD-10-CM

## 2022-05-20 DIAGNOSIS — Z7189 Other specified counseling: Secondary | ICD-10-CM

## 2022-05-20 DIAGNOSIS — L814 Other melanin hyperpigmentation: Secondary | ICD-10-CM

## 2022-05-20 DIAGNOSIS — D229 Melanocytic nevi, unspecified: Secondary | ICD-10-CM

## 2022-05-20 DIAGNOSIS — D2339 Other benign neoplasm of skin of other parts of face: Secondary | ICD-10-CM

## 2022-05-20 DIAGNOSIS — L578 Other skin changes due to chronic exposure to nonionizing radiation: Secondary | ICD-10-CM | POA: Diagnosis not present

## 2022-05-20 DIAGNOSIS — Z5111 Encounter for antineoplastic chemotherapy: Secondary | ICD-10-CM

## 2022-05-20 DIAGNOSIS — L821 Other seborrheic keratosis: Secondary | ICD-10-CM

## 2022-05-20 DIAGNOSIS — D239 Other benign neoplasm of skin, unspecified: Secondary | ICD-10-CM

## 2022-05-20 DIAGNOSIS — L738 Other specified follicular disorders: Secondary | ICD-10-CM

## 2022-05-20 NOTE — Progress Notes (Signed)
Follow-Up Visit   Subjective  Matthew Webster is a 72 y.o. male who presents for the following: Skin Cancer Screening and Upper Body Skin Exam  The patient presents for Upper Body Skin Exam (UBSE) for skin cancer screening and mole check. The patient has spots, moles and lesions to be evaluated, some may be new or changing. He has a couple of scaly spots on the right forehead/hairline.     The following portions of the chart were reviewed this encounter and updated as appropriate: medications, allergies, medical history  Review of Systems:  No other skin or systemic complaints except as noted in HPI or Assessment and Plan.  Objective  Well appearing patient in no apparent distress; mood and affect are within normal limits.  All skin waist up examined. Relevant physical exam findings are noted in the Assessment and Plan.  upper forehead x 1, R lat forehead x 1, R cheek x 1, vertex scalp x 2 (5) Pink scaly macules.   Left Malar Cheek Dilated pore    Assessment & Plan   AK (actinic keratosis) (5) upper forehead x 1, R lat forehead x 1, R cheek x 1, vertex scalp x 2  Actinic keratoses are precancerous spots that appear secondary to cumulative UV radiation exposure/sun exposure over time. They are chronic with expected duration over 1 year. A portion of actinic keratoses will progress to squamous cell carcinoma of the skin. It is not possible to reliably predict which spots will progress to skin cancer and so treatment is recommended to prevent development of skin cancer.  Recommend daily broad spectrum sunscreen SPF 30+ to sun-exposed areas, reapply every 2 hours as needed.  Recommend staying in the shade or wearing long sleeves, sun glasses (UVA+UVB protection) and wide brim hats (4-inch brim around the entire circumference of the hat). Call for new or changing lesions.  Destruction of lesion - upper forehead x 1, R lat forehead x 1, R cheek x 1, vertex scalp x 2  Destruction  method: cryotherapy   Informed consent: discussed and consent obtained   Lesion destroyed using liquid nitrogen: Yes   Region frozen until ice ball extended beyond lesion: Yes   Outcome: patient tolerated procedure well with no complications   Post-procedure details: wound care instructions given   Additional details:  Prior to procedure, discussed risks of blister formation, small wound, skin dyspigmentation, or rare scar following cryotherapy. Recommend Vaseline ointment to treated areas while healing.   Dilated pore of Winer Left Malar Cheek  Benign, observe.     Lentigines, Seborrheic Keratoses, Hemangiomas - Benign normal skin lesions - Benign-appearing - Call for any changes  Melanocytic Nevi - Tan-brown and/or pink-flesh-colored symmetric macules and papules - Benign appearing on exam today - Observation - Call clinic for new or changing moles - Recommend daily use of broad spectrum spf 30+ sunscreen to sun-exposed areas.   Sebaceous Hyperplasia - Small yellow papules with a central dell - Benign-appearing - Observe. Call for changes.  ACTINIC DAMAGE WITH PRECANCEROUS ACTINIC KERATOSES Counseling for Topical Chemotherapy Management: Patient exhibits: - Severe, confluent actinic changes with pre-cancerous actinic keratoses that is secondary to cumulative UV radiation exposure over time - Condition that is severe; chronic, not at goal. - diffuse scaly erythematous macules and papules with underlying dyspigmentation - Discussed Prescription "Field Treatment" topical Chemotherapy for Severe, Chronic Confluent Actinic Changes with Pre-Cancerous Actinic Keratoses Field treatment involves treatment of an entire area of skin that has confluent Actinic Changes (Sun/ Ultraviolet  light damage) and PreCancerous Actinic Keratoses by method of PhotoDynamic Therapy (PDT) and/or prescription Topical Chemotherapy agents such as 5-fluorouracil, 5-fluorouracil/calcipotriene, and/or  imiquimod.  The purpose is to decrease the number of clinically evident and subclinical PreCancerous lesions to prevent progression to development of skin cancer by chemically destroying early precancer changes that may or may not be visible.  It has been shown to reduce the risk of developing skin cancer in the treated area. As a result of treatment, redness, scaling, crusting, and open sores may occur during treatment course. One or more than one of these methods may be used and may have to be used several times to control, suppress and eliminate the PreCancerous changes. Discussed treatment course, expected reaction, and possible side effects. - Recommend daily broad spectrum sunscreen SPF 30+ to sun-exposed areas, reapply every 2 hours as needed.  - Staying in the shade or wearing long sleeves, sun glasses (UVA+UVB protection) and wide brim hats (4-inch brim around the entire circumference of the hat) are also recommended. - Call for new or changing lesions. - Patient has a prescription for 5-fluorouracil/calcipotriene cream Apply bid to scalp x 7-10 days. May also do same treatment to face 5-7 days.   Skin cancer screening performed today.  HISTORY OF BASAL CELL CARCINOMA OF THE SKIN - No evidence of recurrence today of the left upper forehead, right upper forehead, left anterior ear helix - Recommend regular full body skin exams - Recommend daily broad spectrum sunscreen SPF 30+ to sun-exposed areas, reapply every 2 hours as needed.  - Call if any new or changing lesions are noted between office visits  Acrochordons (Skin Tags) - Fleshy, skin-colored pedunculated papules - Benign appearing.  - Observe. - If desired, they can be removed with an in office procedure that is not covered by insurance. - Please call the clinic if you notice any new or changing lesions.   Return in about 6 months (around 11/19/2022) for AKs. Also soonest appt for skin tag removal..  I, Cherlyn Labella, CMA, am  acting as scribe for Willeen Niece, MD .   Documentation: I have reviewed the above documentation for accuracy and completeness, and I agree with the above.  Willeen Niece, MD

## 2022-05-20 NOTE — Patient Instructions (Addendum)
Cryotherapy Aftercare  Wash gently with soap and water everyday.   Apply Vaseline and Band-Aid daily until healed.    Start 5-fluorouracil/calcipotriene cream Apply twice a day to scalp x 7-10 days. May also do same treatment to face twice daily for 5-7 days.   5-Fluorouracil/Calcipotriene Patient Education   Actinic keratoses are the dry, red scaly spots on the skin caused by sun damage. A portion of these spots can turn into skin cancer with time, and treating them can help prevent development of skin cancer.   Treatment of these spots requires removal of the defective skin cells. There are various ways to remove actinic keratoses, including freezing with liquid nitrogen, treatment with creams, or treatment with a blue light procedure in the office.   5-fluorouracil cream is a topical cream used to treat actinic keratoses. It works by interfering with the growth of abnormal fast-growing skin cells, such as actinic keratoses. These cells peel off and are replaced by healthy ones.   5-fluorouracil/calcipotriene is a combination of the 5-fluorouracil cream with a vitamin D analog cream called calcipotriene. The calcipotriene alone does not treat actinic keratoses. However, when it is combined with 5-fluorouracil, it helps the 5-fluorouracil treat the actinic keratoses much faster so that the same results can be achieved with a much shorter treatment time.  INSTRUCTIONS FOR 5-FLUOROURACIL/CALCIPOTRIENE CREAM:   5-fluorouracil/calcipotriene cream typically only needs to be used for 4-7 days. A thin layer should be applied twice a day to the treatment areas recommended by your physician.   If your physician prescribed you separate tubes of 5-fluourouracil and calcipotriene, apply a thin layer of 5-fluorouracil followed by a thin layer of calcipotriene.   Avoid contact with your eyes, nostrils, and mouth. Do not use 5-fluorouracil/calcipotriene cream on infected or open wounds.   You will  develop redness, irritation and some crusting at areas where you have pre-cancer damage/actinic keratoses. IF YOU DEVELOP PAIN, BLEEDING, OR SIGNIFICANT CRUSTING, STOP THE TREATMENT EARLY - you have already gotten a good response and the actinic keratoses should clear up well.  Wash your hands after applying 5-fluorouracil 5% cream on your skin.   A moisturizer or sunscreen with a minimum SPF 30 should be applied each morning.   Once you have finished the treatment, you can apply a thin layer of Vaseline twice a day to irritated areas to soothe and calm the areas more quickly. If you experience significant discomfort, contact your physician.  For some patients it is necessary to repeat the treatment for best results.  SIDE EFFECTS: When using 5-fluorouracil/calcipotriene cream, you may have mild irritation, such as redness, dryness, swelling, or a mild burning sensation. This usually resolves within 2 weeks. The more actinic keratoses you have, the more redness and inflammation you can expect during treatment. Eye irritation has been reported rarely. If this occurs, please let us know.  If you have any trouble using this cream, please call the office. If you have any other questions about this information, please do not hesitate to ask me before you leave the office.  Seborrheic Keratosis  What causes seborrheic keratoses? Seborrheic keratoses are harmless, common skin growths that first appear during adult life.  As time goes by, more growths appear.  Some people may develop a large number of them.  Seborrheic keratoses appear on both covered and uncovered body parts.  They are not caused by sunlight.  The tendency to develop seborrheic keratoses can be inherited.  They vary in color from skin-colored to gray,  brown, or even black.  They can be either smooth or have a rough, warty surface.   Seborrheic keratoses are superficial and look as if they were stuck on the skin.  Under the microscope this  type of keratosis looks like layers upon layers of skin.  That is why at times the top layer may seem to fall off, but the rest of the growth remains and re-grows.    Treatment Seborrheic keratoses do not need to be treated, but can easily be removed in the office.  Seborrheic keratoses often cause symptoms when they rub on clothing or jewelry.  Lesions can be in the way of shaving.  If they become inflamed, they can cause itching, soreness, or burning.  Removal of a seborrheic keratosis can be accomplished by freezing, burning, or surgery. If any spot bleeds, scabs, or grows rapidly, please return to have it checked, as these can be an indication of a skin cancer.    Due to recent changes in healthcare laws, you may see results of your pathology and/or laboratory studies on MyChart before the doctors have had a chance to review them. We understand that in some cases there may be results that are confusing or concerning to you. Please understand that not all results are received at the same time and often the doctors may need to interpret multiple results in order to provide you with the best plan of care or course of treatment. Therefore, we ask that you please give [place] 2 business days to thoroughly review all your results before contacting the office for clarification. Should we see a critical lab result, you will be contacted sooner.   If You Need Anything After Your Visit  If you have any questions or concerns for your doctor, please call our main line at [phone] and press option 4 to reach your doctor's medical assistant. If no one answers, please leave a voicemail as directed and we will return your call as soon as possible. Messages left after 4 pm will be answered the following business day.   You may also send [place] a message via MyChart. We typically respond to MyChart messages within 1-2 business days.  For prescription refills, please ask your pharmacy to contact our office. Our fax  number is [phone].  If you have an urgent issue when the clinic is closed that cannot wait until the next business day, you can page your doctor at the number below.    Please note that while we do our best to be available for urgent issues outside of office hours, we are not available 24/7.   If you have an urgent issue and are unable to reach [place], you may choose to seek medical care at your doctor's office, retail clinic, urgent care center, or emergency room.  If you have a medical emergency, please immediately call 911 or go to the emergency department.  Pager Numbers  - Dr. Gwen Pounds: [phone]  - Dr. Neale Burly: [phone]  - Dr. Roseanne Reno: [phone]  In the event of inclement weather, please call our main line at [phone] for an update on the status of any delays or closures.  Dermatology Medication Tips: Please keep the boxes that topical medications come in in order to help keep track of the instructions about where and how to use these. Pharmacies typically print the medication instructions only on the boxes and not directly on the medication tubes.   If your medication is too expensive, please contact our office at [phone]  option 4 or send [place] a message through MyChart.   We are unable to tell what your co-pay for medications will be in advance as this is different depending on your insurance coverage. However, we may be able to find a substitute medication at lower cost or fill out paperwork to get insurance to cover a needed medication.   If a prior authorization is required to get your medication covered by your insurance company, please allow [place] 1-2 business days to complete this process.  Drug prices often vary depending on where the prescription is filled and some pharmacies may offer cheaper prices.  The website www.goodrx.com contains coupons for medications through different pharmacies. The prices here do not account for what the cost may be with help  from insurance (it may be cheaper with your insurance), but the website can give you the price if you did not use any insurance.  - You can print the associated coupon and take it with your prescription to the pharmacy.  - You may also stop by our office during regular business hours and pick up a GoodRx coupon card.  - If you need your prescription sent electronically to a different pharmacy, notify our office through [facility] or by phone at [phone] option 4.     Si Usted Necesita Algo Despus de Su Visita  Tambin puede enviarnos un mensaje a travs de Clinical cytogeneticist. Por lo general respondemos a los mensajes de MyChart en el transcurso de 1 a 2 das hbiles.  Para renovar recetas, por favor pida a su farmacia que se ponga en contacto con nuestra oficina. Annie Sable de fax es [place] [phone].  Si tiene un asunto urgente cuando la clnica est cerrada y que no puede esperar hasta el siguiente da hbil, puede llamar/localizar a su doctor(a) al nmero que aparece a continuacin.   Por favor, tenga en cuenta que aunque hacemos todo lo posible para estar disponibles para asuntos urgentes fuera del horario de [place], no estamos disponibles las 24 horas del da, los 7 [address]  [address] de la [place].   Si tiene un problema urgente y no puede comunicarse con nosotros, puede optar por buscar atencin mdica  en el consultorio de su doctor(a), en una clnica privada, en un centro de atencin urgente o en una sala de emergencias.  Si tiene Engineer, drilling, por favor llame inmediatamente al 911 o vaya a la sala de emergencias.  Nmeros de bper  - Dr. Gwen Pounds: [phone]  - Dra. Moye: [phone]  - Dra. Roseanne Reno: [phone]  En caso de inclemencias del [place], por favor llame a Lacy Duverney principal al [phone] para una actualizacin sobre el [place] de cualquier retraso o cierre.  Consejos para la medicacin en dermatologa: Por favor, guarde las cajas en las que vienen los  medicamentos de uso tpico para ayudarle a seguir las instrucciones sobre dnde y cmo usarlos. Las farmacias generalmente imprimen las instrucciones del medicamento slo en las cajas y no directamente en los tubos del [place].   Si su medicamento es muy caro, por favor, pngase en contacto con Rolm [place]ala llamando al [phone] y presione la opcin 4 o envenos un mensaje a travs de Clinical cytogeneticist.   No podemos decirle cul ser su copago por los medicamentos por adelantado ya que esto es diferente dependiendo de la cobertura de su seguro. Sin embargo, es posible que podamos encontrar un medicamento sustituto a Audiological scientist un formulario para que el seguro cubra el medicamento que se considera necesario.  Si se requiere una autorizacin previa para que su compaa de seguros cubra su medicamento, por favor permtanos de 1 a 2 das hbiles para completar este proceso.  Los precios de los medicamentos varan con frecuencia dependiendo del lugar de dnde se surte la receta y alguna farmacias pueden ofrecer precios ms baratos.  El sitio web www.goodrx.com tiene cupones para medicamentos de diferentes farmacias. Los precios aqu no tienen en cuenta lo que podra costar con la ayuda del seguro (puede ser ms barato con su seguro), pero el sitio web puede darle el precio si no utiliz ningn seguro.  - Puede imprimir el cupn correspondiente y llevarlo con su receta a la farmacia.  - Tambin puede pasar por nuestra oficina durante el horario de atencin regular y recoger una tarjeta de cupones de GoodRx.  - Si necesita que su receta se enve electrnicamente a una farmacia diferente, informe a nuestra oficina a travs de MyChart de [place] o por telfono llamando al [phone] y presione la opcin 4.  

## 2022-06-05 ENCOUNTER — Ambulatory Visit: Payer: Medicare Other | Admitting: Urology

## 2022-06-05 VITALS — BP 155/87 | HR 69 | Ht 66.0 in | Wt 196.0 lb

## 2022-06-05 DIAGNOSIS — N401 Enlarged prostate with lower urinary tract symptoms: Secondary | ICD-10-CM

## 2022-06-05 DIAGNOSIS — R972 Elevated prostate specific antigen [PSA]: Secondary | ICD-10-CM

## 2022-06-05 LAB — BLADDER SCAN AMB NON-IMAGING: Scan Result: 41

## 2022-06-05 NOTE — Progress Notes (Signed)
Marcelle Overlie Plume,acting as a scribe for Vanna Scotland, MD.,have documented all relevant documentation on the behalf of Vanna Scotland, MD,as directed by  Vanna Scotland, MD while in the presence of Vanna Scotland, MD.  06/05/2022 3:38 PM   Maxcine Ham Jan 23, 1951 [id number]  Referring provider: Dorothey Baseman, MD [phone] [address] [place],  [place] [address]  Chief Complaint  Patient presents with   Follow-up    HPI: 72 year-old male who returns today for his routine annual follow up. He has a personal history of a negative prostate biopsy in 2015 when his PSA was 5 and his TRUS volume was 19. An MRI in 2017 showed a PI-RADS 5 lesion.   His most recent PSA on 05/13/2022 was 4.4.   He is currently on Flomax and takes tadalafil as needed.   He reports considering the UroLift procedure due to persistent urinary symptoms despite being on Flomax, which he states does not fully alleviate his symptoms. He has previously undergone cystoscopy and ultrasound evaluations as part of his diagnostic workup.    Results for orders placed or performed in visit on 06/05/22  BLADDER SCAN AMB NON-IMAGING  Result Value Ref Range   Scan Result 41 ml       IPSS     Row Name 06/05/22 1500         International Prostate Symptom Score   How often have you had the sensation of not emptying your bladder? About half the time     How often have you had to urinate less than every two hours? Less than 1 in 5 times     How often have you found you stopped and started again several times when you urinated? More than half the time     How often have you found it difficult to postpone urination? Less than half the time     How often have you had a weak urinary stream? About half the time     How often have you had to strain to start urination? Less than half the time     How many times did you typically get up at night to urinate? 2 Times     Total IPSS Score 17       Quality of Life due to urinary  symptoms   If you were to spend the rest of your life with your urinary condition just the way it is now how would you feel about that? Mixed              Score:  1-7 Mild 8-19 Moderate 20-35 Severe   PMH: Past Medical History:  Diagnosis Date   Actinic keratosis    Anxiety    Benign non-nodular prostatic hyperplasia with lower urinary tract symptoms    Cancer of sigmoid colon (HCC) 06/20/2014   Depression 11/24/2013   Overview:  Questionable bipolar   Diverticulosis    Fatty liver    on CT   Hemorrhoids    History of chicken pox    Hx of basal cell carcinoma 12/24/2016   Left anterior ear helix   Hx of basal cell carcinoma 06/24/2017   Left upper forehead   Hx of basal cell carcinoma 08/14/2017   Right upper forehead   Hypertension 11/24/2013   Personal history of colonic polyps 02/24/2014    Surgical History: Past Surgical History:  Procedure Laterality Date   CATARACT EXTRACTION W/ INTRAOCULAR LENS IMPLANT Left 2011   CATARACT EXTRACTION W/PHACO Right 05/26/2019  Procedure: CATARACT EXTRACTION PHACO AND INTRAOCULAR LENS PLACEMENT (IOC) RIGHT 10.64  01:10.2  15.1%;  Surgeon: Lockie Mola, MD;  Location: [facility];  Service: Ophthalmology;  Laterality: Right;   COLON SURGERY     COLONOSCOPY WITH PROPOFOL N/A 07/22/2017   Procedure: COLONOSCOPY WITH PROPOFOL;  Surgeon: Christena Deem, MD;  Location: [facility];  Service: Endoscopy;  Laterality: N/A;   COLONOSCOPY WITH PROPOFOL N/A 07/21/2019   Procedure: COLONOSCOPY WITH PROPOFOL;  Surgeon: Toledo, Boykin Nearing, MD;  Location: [facility] ENDOSCOPY;  Service: Gastroenterology;  Laterality: N/A;   DIAGNOSTIC LAPAROSCOPY     ESOPHAGOGASTRODUODENOSCOPY N/A 07/21/2019   Procedure: ESOPHAGOGASTRODUODENOSCOPY (EGD);  Surgeon: Toledo, Boykin Nearing, MD;  Location: [facility] ENDOSCOPY;  Service: Gastroenterology;  Laterality: N/A;   EYE SURGERY     DETACHED RETINA   LAPAROSCOPIC BOWEL RESECTION     RETINAL  DETACHMENT SURGERY     TONSILLECTOMY     WISDOM TOOTH EXTRACTION      Home Medications:  Allergies as of 06/05/2022       Reactions   Alphagan P [brimonidine Tartrate] Swelling   Eye redness, swelling, and discharge   Codeine    constipation   Shellfish Allergy Diarrhea, Nausea And Vomiting   Raw clams   Penicillin V Potassium Rash   Penicillins Rash        Medication List        Accurate as of Jun 05, 2022  3:38 PM. If you have any questions, ask your nurse or doctor.          latanoprost 0.005 % ophthalmic solution Commonly known as: XALATAN Place 1 drop into both eyes at bedtime.   losartan-hydrochlorothiazide 50-12.5 MG tablet Commonly known as: HYZAAR Take 1 tablet by mouth daily.   sildenafil 20 MG tablet Commonly known as: REVATIO Take 20 mg by mouth as needed.   tamsulosin 0.4 MG Caps capsule Commonly known as: FLOMAX TAKE 1 CAPSULE(0.4 MG) BY MOUTH DAILY   timolol 0.5 % ophthalmic solution Commonly known as: TIMOPTIC instill 1 drop into left eye every morning   venlafaxine XR 150 MG 24 hr capsule Commonly known as: EFFEXOR-XR Take 150 mg by mouth daily with breakfast.        Allergies:  Allergies  Allergen Reactions   Alphagan P [Brimonidine Tartrate] Swelling    Eye redness, swelling, and discharge   Codeine     constipation   Shellfish Allergy Diarrhea and Nausea And Vomiting    Raw clams   Penicillin V Potassium Rash   Penicillins Rash    Family History: Family History  Problem Relation Age of Onset   CVA Mother    Diabetes Father    Prostate cancer Neg Hx    Bladder Cancer Neg Hx    Kidney cancer Neg Hx     Social History:  reports that he has never smoked. He has never used smokeless tobacco. He reports current alcohol use of about 21.0 standard drinks of alcohol per week. He reports that he does not use drugs.   Physical Exam: BP (!) 155/87   Pulse 69   Ht 5\' 6"  (1.676 m)   Wt 196 lb (88.9 kg)   BMI 31.64 kg/m    Constitutional:  Alert and oriented, No acute distress. HEENT: [place] AT, moist mucus membranes.  Trachea midline, no masses. Neurologic: Grossly intact, no focal deficits, moving all 4 extremities. Psychiatric: Normal mood and affect.   Assessment & Plan:    1. BPH with LUTs -  DRE deferred today - He continues to have refractory symptoms despite being on Flomax. He has previously considered Urolift. He has had a full workup and is an excellent candidate for this and is now interested in proceeding. We discussed risks and benefits including risk of pelvic pain, failure of the procedure, infection and bleeding, amongst others. We discussed the pre-op and post-op course. All questions were answered and we will proceed as planned.   Return for Urolift.   [facility] [address] [place], [place] [address] [phone]

## 2022-06-06 ENCOUNTER — Other Ambulatory Visit: Payer: Self-pay | Admitting: Urology

## 2022-06-06 DIAGNOSIS — N138 Other obstructive and reflux uropathy: Secondary | ICD-10-CM

## 2022-06-06 NOTE — Progress Notes (Signed)
Surgical Physician Order Form [facility] [place]  * Scheduling expectation : Next Available  *Length of Case:   *Clearance needed: no  *Anticoagulation Instructions: Hold all anticoagulants  *Aspirin Instructions: Hold Aspirin  *Post-op visit Date/Instructions:  4-6 week w/PVR/ IPSS  *Diagnosis: BPH w/urinary obstruction  *Procedure:  UroLIFT     Additional orders: N/A  -Admit type: OUTpatient  -Anesthesia: MAC  -VTE Prophylaxis Standing Order SCD's       Other:   -Standing Lab Orders Per Anesthesia    Lab other: UA&Urine Culture  -Standing Test orders EKG/Chest x-ray per Anesthesia       Test other:   - Medications:  Ancef 2gm IV   ok with allergy  -Other orders:  N/A

## 2022-06-07 ENCOUNTER — Telehealth: Payer: Self-pay

## 2022-06-07 NOTE — Telephone Encounter (Signed)
Tried to call patient to schedule surgery, no answer. Did leave a detailed message to return my call to schedule. Will Try again.

## 2022-06-12 DIAGNOSIS — K080 Exfoliation of teeth due to systemic causes: Secondary | ICD-10-CM | POA: Diagnosis not present

## 2022-06-13 ENCOUNTER — Telehealth: Payer: Self-pay

## 2022-06-13 NOTE — Progress Notes (Signed)
   [facility] Surgical Posting Form  Surgery Date: Date: 07/08/2022  Surgeon: Dr. Vanna Scotland, MD  Inpt ( No  )   Outpt (Yes)   Obs ( No  )   Diagnosis: N40.1, N13.8 Benign Prostatic Hyperplasia with Urinary Obstruction  -CPT: 52441, [phone]  Surgery: Cystoscopy with Insertion of Urolift  Stop Anticoagulations: Yes  Cardiac/Medical/Pulmonary Clearance needed: no  *Orders entered into EPIC  Date: 06/13/22   *Case booked in EPIC  Date: 06/12/2022  *Notified pt of Surgery: Date: 06/12/2022  PRE-OP UA & CX: yes, will obtain in clinic on 06/27/2022  *Placed into Prior Authorization Work Angela Nevin Date: 06/13/22  Assistant/laser/rep:No

## 2022-06-13 NOTE — Telephone Encounter (Signed)
I spoke with Matthew Webster. We have discussed possible surgery dates and Monday June 10th, 2024 was agreed upon by all parties. Patient given information about surgery date, what to expect pre-operatively and post operatively.  We discussed that a Pre-Admission Testing office will be calling to set up the pre-op visit that will take place prior to surgery, and that these appointments are typically done over the phone with a Pre-Admissions RN. Informed patient that our office will communicate any additional care to be provided after surgery. Patients questions or concerns were discussed during our call. Advised to call our office should there be any additional information, questions or concerns that arise. Patient verbalized understanding.

## 2022-06-25 ENCOUNTER — Ambulatory Visit: Payer: Medicare Other | Admitting: Dermatology

## 2022-06-25 VITALS — BP 134/84

## 2022-06-25 DIAGNOSIS — W908XXA Exposure to other nonionizing radiation, initial encounter: Secondary | ICD-10-CM

## 2022-06-25 DIAGNOSIS — L578 Other skin changes due to chronic exposure to nonionizing radiation: Secondary | ICD-10-CM | POA: Diagnosis not present

## 2022-06-25 DIAGNOSIS — L918 Other hypertrophic disorders of the skin: Secondary | ICD-10-CM

## 2022-06-25 DIAGNOSIS — X32XXXA Exposure to sunlight, initial encounter: Secondary | ICD-10-CM

## 2022-06-25 DIAGNOSIS — Z872 Personal history of diseases of the skin and subcutaneous tissue: Secondary | ICD-10-CM

## 2022-06-25 NOTE — Progress Notes (Signed)
   Follow-Up Visit   Subjective  Matthew Webster is a 72 y.o. male who presents for the following: skin tags, bil axilla, pt would like removed.  They get irritated and sore.   The following portions of the chart were reviewed this encounter and updated as appropriate: medications, allergies, medical history  Review of Systems:  No other skin or systemic complaints except as noted in HPI or Assessment and Plan.  Objective  Well appearing patient in no apparent distress; mood and affect are within normal limits.   A focused examination was performed of the following areas: Bil axilla  Relevant exam findings are noted in the Assessment and Plan.  L axilla x 9, R axilla x 6 (15) Fleshy, skin-colored pedunculated papules.      Assessment & Plan     Skin tag (15) L axilla x 9, R axilla x 6  Symptomatic  Epidermal / dermal shaving - L axilla x 9, R axilla x 6  Informed consent: discussed and consent obtained   Anesthesia: the lesion was anesthetized in a standard fashion   Anesthetic:  1% lidocaine w/ epinephrine 1-100,000 buffered w/ 8.4% NaHCO3 Instrument used: scissors   Hemostasis achieved with: pressure, aluminum chloride and electrodesiccation   Outcome: patient tolerated procedure well   Post-procedure details: wound care instructions given   Additional details:  L axilla x 9, R axilla x 6 removed   ACTINIC DAMAGE - chronic, secondary to cumulative UV radiation exposure/sun exposure over time - diffuse scaly erythematous macules with underlying dyspigmentation - Recommend daily broad spectrum sunscreen SPF 30+ to sun-exposed areas, reapply every 2 hours as needed.  - Recommend staying in the shade or wearing long sleeves, sun glasses (UVA+UVB protection) and wide brim hats (4-inch brim around the entire circumference of the hat). - Call for new or changing lesions.   HISTORY OF PRECANCEROUS ACTINIC KERATOSIS - site(s) of PreCancerous Actinic Keratosis clear  today. - these may recur and new lesions may form requiring treatment to prevent transformation into skin cancer - observe for new or changing spots and contact [facility] for appointment if occur - photoprotection with sun protective clothing; sunglasses and broad spectrum sunscreen with SPF of at least 30 + and frequent self skin exams recommended - yearly exams by a dermatologist recommended for persons with history of PreCancerous Actinic Keratoses   Return for as scheduled for [place] f/u.  I, Ardis Rowan, RMA, am acting as scribe for Willeen Niece, MD .   Documentation: I have reviewed the above documentation for accuracy and completeness, and I agree with the above.  Willeen Niece, MD

## 2022-06-25 NOTE — Patient Instructions (Addendum)
Due to recent changes in healthcare laws, you may see results of your pathology and/or laboratory studies on MyChart before the doctors have had a chance to review them. We understand that in some cases there may be results that are confusing or concerning to you. Please understand that not all results are received at the same time and often the doctors may need to interpret multiple results in order to provide you with the best plan of care or course of treatment. Therefore, we ask that you please give us 2 business days to thoroughly review all your results before contacting the office for clarification. Should we see a critical lab result, you will be contacted sooner.   If You Need Anything After Your Visit  If you have any questions or concerns for your doctor, please call our main line at [phone] and press option 4 to reach your doctor's medical assistant. If no one answers, please leave a voicemail as directed and we will return your call as soon as possible. Messages left after 4 pm will be answered the following business day.   You may also send us a message via MyChart. We typically respond to MyChart messages within 1-2 business days.  For prescription refills, please ask your pharmacy to contact our office. Our fax number is [phone].  If you have an urgent issue when the clinic is closed that cannot wait until the next business day, you can page your doctor at the number below.    Please note that while we do our best to be available for urgent issues outside of office hours, we are not available 24/7.   If you have an urgent issue and are unable to reach us, you may choose to seek medical care at your doctor's office, retail clinic, urgent care center, or emergency room.  If you have a medical emergency, please immediately call 911 or go to the emergency department.  Pager Numbers  - Dr. Kowalski: [phone]  - Dr. Moye: [phone]  - Dr. Stewart:  [phone]  In the event of inclement weather, please call our main line at [phone] for an update on the status of any delays or closures.  Dermatology Medication Tips: Please keep the boxes that topical medications come in in order to help keep track of the instructions about where and how to use these. Pharmacies typically print the medication instructions only on the boxes and not directly on the medication tubes.   If your medication is too expensive, please contact our office at [phone] option 4 or send us a message through MyChart.   We are unable to tell what your co-pay for medications will be in advance as this is different depending on your insurance coverage. However, we may be able to find a substitute medication at lower cost or fill out paperwork to get insurance to cover a needed medication.   If a prior authorization is required to get your medication covered by your insurance company, please allow us 1-2 business days to complete this process.  Drug prices often vary depending on where the prescription is filled and some pharmacies may offer cheaper prices.  The website www.goodrx.com contains coupons for medications through different pharmacies. The prices here do not account for what the cost may be with help from insurance (it may be cheaper with your insurance), but the website can give you the price if you did not use any insurance.  - You can print the associated coupon and take it with   your prescription to the pharmacy.  - You may also stop by our office during regular business hours and pick up a GoodRx coupon card.  - If you need your prescription sent electronically to a different pharmacy, notify our office through [facility] MyChart or by phone at [phone] option 4.     Si Usted Necesita Algo Despus de Su Visita  Tambin puede enviarnos un mensaje a travs de MyChart. Por lo general respondemos a los mensajes de MyChart en el transcurso de 1 a 2  das hbiles.  Para renovar recetas, por favor pida a su farmacia que se ponga en contacto con nuestra oficina. Nuestro nmero de fax es el [phone].  Si tiene un asunto urgente cuando la clnica est cerrada y que no puede esperar hasta el siguiente da hbil, puede llamar/localizar a su doctor(a) al nmero que aparece a continuacin.   Por favor, tenga en cuenta que aunque hacemos todo lo posible para estar disponibles para asuntos urgentes fuera del horario de oficina, no estamos disponibles las 24 horas del da, los 7 das de la semana.   Si tiene un problema urgente y no puede comunicarse con nosotros, puede optar por buscar atencin mdica  en el consultorio de su doctor(a), en una clnica privada, en un centro de atencin urgente o en una sala de emergencias.  Si tiene una emergencia mdica, por favor llame inmediatamente al 911 o vaya a la sala de emergencias.  Nmeros de bper  - Dr. Kowalski: [phone]  - Dra. Moye: [phone]  - Dra. Stewart: [phone]  En caso de inclemencias del tiempo, por favor llame a nuestra lnea principal al [phone] para una actualizacin sobre el estado de cualquier retraso o cierre.  Consejos para la medicacin en dermatologa: Por favor, guarde las cajas en las que vienen los medicamentos de uso tpico para ayudarle a seguir las instrucciones sobre dnde y cmo usarlos. Las farmacias generalmente imprimen las instrucciones del medicamento slo en las cajas y no directamente en los tubos del medicamento.   Si su medicamento es muy caro, por favor, pngase en contacto con nuestra oficina llamando al [phone] y presione la opcin 4 o envenos un mensaje a travs de MyChart.   No podemos decirle cul ser su copago por los medicamentos por adelantado ya que esto es diferente dependiendo de la cobertura de su seguro. Sin embargo, es posible que podamos encontrar un medicamento sustituto a menor costo o llenar un formulario para que el  seguro cubra el medicamento que se considera necesario.   Si se requiere una autorizacin previa para que su compaa de seguros cubra su medicamento, por favor permtanos de 1 a 2 das hbiles para completar este proceso.  Los precios de los medicamentos varan con frecuencia dependiendo del lugar de dnde se surte la receta y alguna farmacias pueden ofrecer precios ms baratos.  El sitio web www.goodrx.com tiene cupones para medicamentos de diferentes farmacias. Los precios aqu no tienen en cuenta lo que podra costar con la ayuda del seguro (puede ser ms barato con su seguro), pero el sitio web puede darle el precio si no utiliz ningn seguro.  - Puede imprimir el cupn correspondiente y llevarlo con su receta a la farmacia.  - Tambin puede pasar por nuestra oficina durante el horario de atencin regular y recoger una tarjeta de cupones de GoodRx.  - Si necesita que su receta se enve electrnicamente a una farmacia diferente, informe a nuestra oficina a travs de MyChart de [facility]   o por telfono llamando al [phone] y presione la opcin 4.  

## 2022-06-27 ENCOUNTER — Encounter
Admission: RE | Admit: 2022-06-27 | Discharge: 2022-06-27 | Disposition: A | Discharge status: Home / Self Care | Payer: Medicare Other | Source: Ambulatory Visit | Attending: Urology | Admitting: Urology

## 2022-06-27 ENCOUNTER — Other Ambulatory Visit: Payer: Medicare Other

## 2022-06-27 VITALS — Ht 66.0 in | Wt 200.0 lb

## 2022-06-27 DIAGNOSIS — R001 Bradycardia, unspecified: Secondary | ICD-10-CM | POA: Insufficient documentation

## 2022-06-27 DIAGNOSIS — N138 Other obstructive and reflux uropathy: Secondary | ICD-10-CM | POA: Diagnosis not present

## 2022-06-27 DIAGNOSIS — I10 Essential (primary) hypertension: Secondary | ICD-10-CM

## 2022-06-27 DIAGNOSIS — Z01818 Encounter for other preprocedural examination: Secondary | ICD-10-CM | POA: Insufficient documentation

## 2022-06-27 DIAGNOSIS — N401 Enlarged prostate with lower urinary tract symptoms: Secondary | ICD-10-CM

## 2022-06-27 DIAGNOSIS — Z01812 Encounter for preprocedural laboratory examination: Secondary | ICD-10-CM

## 2022-06-27 DIAGNOSIS — Z01810 Encounter for preprocedural cardiovascular examination: Secondary | ICD-10-CM | POA: Diagnosis not present

## 2022-06-27 HISTORY — DX: Syncope and collapse: R55

## 2022-06-27 LAB — MICROSCOPIC EXAMINATION

## 2022-06-27 LAB — CBC
HCT: 48.5 % (ref 39.0–52.0)
Hemoglobin: 17.2 g/dL — ABNORMAL HIGH (ref 13.0–17.0)
MCH: 33.7 pg (ref 26.0–34.0)
MCHC: 35.5 g/dL (ref 30.0–36.0)
MCV: 95.1 fL (ref 80.0–100.0)
Platelets: 155 10*3/uL (ref 150–400)
RBC: 5.1 MIL/uL (ref 4.22–5.81)
RDW: 12.9 % (ref 11.5–15.5)
WBC: 6 10*3/uL (ref 4.0–10.5)
nRBC: 0 % (ref 0.0–0.2)

## 2022-06-27 LAB — BASIC METABOLIC PANEL
Anion gap: 7 (ref 5–15)
BUN: 12 mg/dL (ref 8–23)
CO2: 28 mmol/L (ref 22–32)
Calcium: 8.9 mg/dL (ref 8.9–10.3)
Chloride: 99 mmol/L (ref 98–111)
Creatinine, Ser: 0.94 mg/dL (ref 0.61–1.24)
GFR, Estimated: >60 mL/min (ref >60)
Glucose, Bld: 173 mg/dL — ABNORMAL HIGH (ref 70–99)
Potassium: 3.5 mmol/L (ref 3.5–5.1)
Sodium: 134 mmol/L — ABNORMAL LOW (ref 135–145)

## 2022-06-27 LAB — URINALYSIS, COMPLETE
Bilirubin, UA: NEGATIVE
Glucose, UA: NEGATIVE
Ketones, UA: NEGATIVE
Nitrite, UA: NEGATIVE
Protein,UA: NEGATIVE
RBC, UA: NEGATIVE
Specific Gravity, UA: 1.02 (ref 1.005–1.030)
Urobilinogen, Ur: 1 mg/dL (ref 0.2–1.0)
pH, UA: 7 (ref 5.0–7.5)

## 2022-06-27 NOTE — Patient Instructions (Addendum)
Your procedure is scheduled on: Monday, June 10 Report to the Registration Desk on the 1st floor of the [facility] To find out your arrival time, please call [phone] between 1PM - 3PM on: Friday, June 7 If your arrival time is 6:00 am, do not arrive before that time as the [facility] entrance doors do not open until 6:00 am.  REMEMBER: Instructions that are not followed completely may result in serious medical risk, up to and including death; or upon the discretion of your surgeon and anesthesiologist your surgery may need to be rescheduled.  Do not eat or drink after midnight the night before surgery.  No gum chewing or hard candies.  One week prior to surgery: starting June 3 Stop Anti-inflammatories (NSAIDS) such as Advil, Aleve, Ibuprofen, Motrin, Naproxen, Naprosyn and Aspirin based products such as Excedrin, Goody's Powder, BC Powder. Stop ANY OVER THE COUNTER supplements until after surgery. You may however, continue to take Tylenol if needed for pain up until the day of surgery.  Continue taking all prescribed medications with the exception of the following:  Sildenafil - hold for 2 days before surgery.  TAKE ONLY THESE MEDICATIONS THE MORNING OF SURGERY WITH A SIP OF WATER:  Timolol eye drops  No Alcohol for 24 hours before or after surgery.  No Smoking including e-cigarettes for 24 hours before surgery.  No chewable tobacco products for at least 6 hours before surgery.  No nicotine patches on the day of surgery.  Do not use any "recreational" drugs for at least a week (preferably 2 weeks) before your surgery.  Please be advised that the combination of cocaine and anesthesia may have negative outcomes, up to and including death. If you test positive for cocaine, your surgery will be cancelled.  On the morning of surgery brush your teeth with toothpaste and water, you may rinse your mouth with mouthwash if you wish. Do not swallow any toothpaste or  mouthwash.  Do not wear jewelry, make-up, hairpins, clips or nail polish.  Do not wear lotions, powders, or perfumes.   Do not shave body hair from the neck down 48 hours before surgery.  Contact lenses, hearing aids and dentures may not be worn into surgery.  Do not bring valuables to the hospital. [facility] is not responsible for any missing/lost belongings or valuables.   Notify your doctor if there is any change in your medical condition (cold, fever, infection).  Wear comfortable clothing (specific to your surgery type) to the hospital.  After surgery, you can help prevent lung complications by doing breathing exercises.  Take deep breaths and cough every 1-2 hours. Your doctor may order a device called an Incentive Spirometer to help you take deep breaths.  If you are being discharged the day of surgery, you will not be allowed to drive home. You will need a responsible individual to drive you home and stay with you for 24 hours after surgery.   If you are taking public transportation, you will need to have a responsible individual with you.  Please call the Pre-admissions Testing Dept. at [phone] if you have any questions about these instructions.  Surgery Visitation Policy:  Patients having surgery or a procedure may have two visitors.  Children under the age of 43 must have an adult with them who is not the patient.

## 2022-06-29 LAB — CULTURE, URINE COMPREHENSIVE

## 2022-07-02 LAB — CULTURE, URINE COMPREHENSIVE

## 2022-07-07 MED ORDER — FAMOTIDINE 20 MG PO TABS
20.0000 mg | ORAL_TABLET | Freq: Once | ORAL | Status: AC
  Administered 2022-07-08: 20 mg via ORAL

## 2022-07-07 MED ORDER — CEFAZOLIN SODIUM-DEXTROSE 2-4 GM/100ML-% IV SOLN
2.0000 g | INTRAVENOUS | Status: AC
  Administered 2022-07-08: 2 g via INTRAVENOUS

## 2022-07-07 MED ORDER — CHLORHEXIDINE GLUCONATE 0.12 % MT SOLN
15.0000 mL | Freq: Once | OROMUCOSAL | Status: AC
  Administered 2022-07-08: 15 mL via OROMUCOSAL

## 2022-07-07 MED ORDER — LACTATED RINGERS IV SOLN: INTRAVENOUS | Status: DC

## 2022-07-07 MED ORDER — ORAL CARE MOUTH RINSE: 15.0000 mL | Freq: Once | OROMUCOSAL | Status: AC

## 2022-07-08 ENCOUNTER — Encounter
Admission: RE | Disposition: A | Discharge status: Home / Self Care | Payer: Self-pay | Source: Home / Self Care | Attending: Urology

## 2022-07-08 ENCOUNTER — Other Ambulatory Visit: Payer: Self-pay

## 2022-07-08 ENCOUNTER — Ambulatory Visit
Admission: RE | Admit: 2022-07-08 | Discharge: 2022-07-08 | Disposition: A | Discharge status: Home / Self Care | Payer: Medicare Other | Attending: Urology | Admitting: Urology

## 2022-07-08 ENCOUNTER — Ambulatory Visit: Payer: Medicare Other | Admitting: Urgent Care

## 2022-07-08 ENCOUNTER — Encounter: Payer: Self-pay | Admitting: Urology

## 2022-07-08 DIAGNOSIS — I10 Essential (primary) hypertension: Secondary | ICD-10-CM | POA: Diagnosis not present

## 2022-07-08 DIAGNOSIS — F32A Depression, unspecified: Secondary | ICD-10-CM | POA: Diagnosis not present

## 2022-07-08 DIAGNOSIS — F419 Anxiety disorder, unspecified: Secondary | ICD-10-CM | POA: Diagnosis not present

## 2022-07-08 DIAGNOSIS — N401 Enlarged prostate with lower urinary tract symptoms: Secondary | ICD-10-CM

## 2022-07-08 DIAGNOSIS — N138 Other obstructive and reflux uropathy: Secondary | ICD-10-CM | POA: Diagnosis not present

## 2022-07-08 DIAGNOSIS — Z79899 Other long term (current) drug therapy: Secondary | ICD-10-CM | POA: Diagnosis not present

## 2022-07-08 DIAGNOSIS — N139 Obstructive and reflux uropathy, unspecified: Secondary | ICD-10-CM | POA: Diagnosis not present

## 2022-07-08 DIAGNOSIS — Z08 Encounter for follow-up examination after completed treatment for malignant neoplasm: Secondary | ICD-10-CM | POA: Diagnosis not present

## 2022-07-08 DIAGNOSIS — Z85828 Personal history of other malignant neoplasm of skin: Secondary | ICD-10-CM | POA: Diagnosis not present

## 2022-07-08 HISTORY — PX: CYSTOSCOPY WITH INSERTION OF UROLIFT: SHX6678

## 2022-07-08 SURGERY — CYSTOSCOPY WITH INSERTION OF UROLIFT
Anesthesia: Monitor Anesthesia Care | Site: Prostate

## 2022-07-08 MED ORDER — FAMOTIDINE 20 MG PO TABS
ORAL_TABLET | ORAL | Status: AC
  Filled 2022-07-08: qty 1

## 2022-07-08 MED ORDER — ACETAMINOPHEN 10 MG/ML IV SOLN: 1000.0000 mg | Freq: Once | INTRAVENOUS | Status: DC | PRN

## 2022-07-08 MED ORDER — OXYCODONE HCL 5 MG PO TABS
5.0000 mg | ORAL_TABLET | Freq: Once | ORAL | Status: AC | PRN
  Administered 2022-07-08: 5 mg via ORAL

## 2022-07-08 MED ORDER — PROPOFOL 10 MG/ML IV BOLUS
INTRAVENOUS | Status: AC
  Filled 2022-07-08: qty 40

## 2022-07-08 MED ORDER — LIDOCAINE HCL (PF) 2 % IJ SOLN
INTRAMUSCULAR | Status: AC
  Filled 2022-07-08: qty 5

## 2022-07-08 MED ORDER — DEXMEDETOMIDINE HCL IN NACL 80 MCG/20ML IV SOLN
INTRAVENOUS | Status: DC | PRN
  Administered 2022-07-08: 8 ug via INTRAVENOUS

## 2022-07-08 MED ORDER — ACETAMINOPHEN 500 MG PO TABS
ORAL_TABLET | ORAL | Status: AC
  Filled 2022-07-08: qty 2

## 2022-07-08 MED ORDER — LACTATED RINGERS IV SOLN: Freq: Once | INTRAVENOUS | Status: DC

## 2022-07-08 MED ORDER — PROPOFOL 10 MG/ML IV BOLUS
INTRAVENOUS | Status: DC | PRN
  Administered 2022-07-08: 20 mg via INTRAVENOUS
  Administered 2022-07-08: 80 mg via INTRAVENOUS

## 2022-07-08 MED ORDER — FENTANYL CITRATE (PF) 100 MCG/2ML IJ SOLN: 25.0000 ug | INTRAMUSCULAR | Status: DC | PRN

## 2022-07-08 MED ORDER — DROPERIDOL 2.5 MG/ML IJ SOLN: 0.6250 mg | Freq: Once | INTRAMUSCULAR | Status: DC | PRN

## 2022-07-08 MED ORDER — ACETAMINOPHEN 500 MG PO TABS: 1000.0000 mg | ORAL_TABLET | Freq: Once | ORAL | Status: DC

## 2022-07-08 MED ORDER — SODIUM CHLORIDE 0.9 % IR SOLN
Status: DC | PRN
  Administered 2022-07-08: 3000 mL

## 2022-07-08 MED ORDER — PROPOFOL 500 MG/50ML IV EMUL
INTRAVENOUS | Status: DC | PRN
  Administered 2022-07-08: 100 ug/kg/min via INTRAVENOUS

## 2022-07-08 MED ORDER — LIDOCAINE HCL (CARDIAC) PF 100 MG/5ML IV SOSY
PREFILLED_SYRINGE | INTRAVENOUS | Status: DC | PRN
  Administered 2022-07-08: 80 mg via INTRAVENOUS

## 2022-07-08 MED ORDER — ACETAMINOPHEN 500 MG PO TABS
1000.0000 mg | ORAL_TABLET | Freq: Once | ORAL | Status: AC
  Administered 2022-07-08: 1000 mg via ORAL

## 2022-07-08 MED ORDER — OXYCODONE HCL 5 MG/5ML PO SOLN: 5.0000 mg | Freq: Once | ORAL | Status: AC | PRN

## 2022-07-08 MED ORDER — PROMETHAZINE HCL 25 MG/ML IJ SOLN: 6.2500 mg | INTRAMUSCULAR | Status: DC | PRN

## 2022-07-08 MED ORDER — CEFAZOLIN SODIUM-DEXTROSE 2-4 GM/100ML-% IV SOLN
INTRAVENOUS | Status: AC
  Filled 2022-07-08: qty 100

## 2022-07-08 MED ORDER — OXYCODONE HCL 5 MG PO TABS
ORAL_TABLET | ORAL | Status: AC
  Filled 2022-07-08: qty 1

## 2022-07-08 MED ORDER — CHLORHEXIDINE GLUCONATE 0.12 % MT SOLN
OROMUCOSAL | Status: AC
  Filled 2022-07-08: qty 15

## 2022-07-08 SURGICAL SUPPLY — 14 items
BAG DRAIN SIEMENS DORNER NS (MISCELLANEOUS) ×1 IMPLANT
BAG DRN NS LF (MISCELLANEOUS) ×1
GLOVE BIO SURGEON STRL SZ 6.5 (GLOVE) ×1 IMPLANT
GOWN STRL REUS W/ TWL LRG LVL3 (GOWN DISPOSABLE) ×2 IMPLANT
GOWN STRL REUS W/TWL LRG LVL3 (GOWN DISPOSABLE) ×2
KIT TURNOVER CYSTO (KITS) ×1 IMPLANT
PACK CYSTO AR (MISCELLANEOUS) ×1 IMPLANT
SET CYSTO W/LG BORE CLAMP LF (SET/KITS/TRAYS/PACK) ×1 IMPLANT
SURGILUBE 2OZ TUBE FLIPTOP (MISCELLANEOUS) IMPLANT
SYSTEM UROLIFT 2 CART W/ HNDL (Male Continence) ×1 IMPLANT
SYSTEM UROLIFT 2 CARTRIDGE (Male Continence) IMPLANT
WATER STERILE IRR 1000ML POUR (IV SOLUTION) ×1 IMPLANT
WATER STERILE IRR 3000ML UROMA (IV SOLUTION) ×1 IMPLANT
WATER STERILE IRR 500ML POUR (IV SOLUTION) ×1 IMPLANT

## 2022-07-08 NOTE — Anesthesia Preprocedure Evaluation (Addendum)
Anesthesia Evaluation  Patient identified by MRN, date of birth, ID band Patient awake    Reviewed: Allergy & Precautions, H&P , NPO status , Patient's Chart, lab work & pertinent test results  History of Anesthesia Complications Negative for: history of anesthetic complications  Airway Mallampati: II  TM Distance: <3 FB Neck ROM: full    Dental no notable dental hx. (+) Chipped, Missing, Poor Dentition   Pulmonary neg pulmonary ROS, neg shortness of breath   Pulmonary exam normal        Cardiovascular Exercise Tolerance: Good hypertension, (-) angina Normal cardiovascular exam     Neuro/Psych  PSYCHIATRIC DISORDERS  Depression    negative neurological ROS     GI/Hepatic negative GI ROS, Neg liver ROS,neg GERD  ,,  Endo/Other  negative endocrine ROS    Renal/GU negative Renal ROS  negative genitourinary   Musculoskeletal   Abdominal   Peds  Hematology negative hematology ROS (+)   Anesthesia Other Findings BPH w/urinary obstruction  Past Medical History: No date: Actinic keratosis No date: Anxiety No date: Benign non-nodular prostatic hyperplasia with lower urinary  tract symptoms 06/20/2014: Cancer of sigmoid colon (HCC) 11/24/2013: Depression     Comment:  Overview:  Questionable bipolar 2010: Detached retina, left No date: Diverticulosis No date: Fatty liver     Comment:  on CT No date: Hemorrhoids No date: History of chicken pox 12/24/2016: Hx of basal cell carcinoma     Comment:  Left anterior ear helix 06/24/2017: Hx of basal cell carcinoma     Comment:  Left upper forehead 08/14/2017: Hx of basal cell carcinoma     Comment:  Right upper forehead 11/24/2013: Hypertension 02/24/2014: Personal history of colonic polyps No date: Recurrent syncope  Past Surgical History: 2011: CATARACT EXTRACTION W/ INTRAOCULAR LENS IMPLANT; Left 05/26/2019: CATARACT EXTRACTION W/PHACO; Right     Comment:   Procedure: CATARACT EXTRACTION PHACO AND INTRAOCULAR               LENS PLACEMENT (IOC) RIGHT 10.64  01:10.2  15.1%;                Surgeon: Lockie Mola, MD;  Location: [facility];  Service: Ophthalmology;  Laterality:               Right; 2016: COLONOSCOPY 07/22/2017: COLONOSCOPY WITH PROPOFOL; N/A     Comment:  Procedure: COLONOSCOPY WITH PROPOFOL;  Surgeon:               Christena Deem, MD;  Location[facility] END[facility]COPY;                Service: Endoscopy;  Laterality: N/A; 07/21/2019: COLONOSCOPY WITH PROPOFOL; N/A     Comment:  Procedure: COLONOSCOPY WITH PROPOFOL;  Surgeon: Toledo,               Boykin Nearing, MD;  Location: [facility] ENDOSCOPY;  Service:               Gastroenterology;  Laterality: N/A; No date: DIAGNOSTIC LAPAROSCOPY No date: ELBOW FRACTURE SURGERY; Left     Comment:  screws and plate 16/10/9602: ESOPHAGOGASTRODUODENOSCOPY; N/A     Comment:  Procedure: ESOPHAGOGASTRODUODENOSCOPY (EGD);  Surgeon:               Toledo, Boykin Nearing, MD;  Location: [facility] ENDOSCOPY;                Service:  Gastroenterology;  Laterality: N/A; No date: LAPAROSCOPIC BOWEL RESECTION     Comment:  8 inches colon removed with polyps 10/24/2008: RETINAL DETACHMENT SURGERY; Left No date: TONSILLECTOMY No date: WISDOM TOOTH EXTRACTION     Reproductive/Obstetrics negative OB ROS                             Anesthesia Physical Anesthesia Plan  ASA: 2  Anesthesia Plan: General   Post-op Pain Management:    Induction: Intravenous  PONV Risk Score and Plan: Propofol infusion and TIVA  Airway Management Planned: Natural Airway and Nasal Cannula  Additional Equipment:   Intra-op Plan:   Post-operative Plan:   Informed Consent: I have reviewed the patients History and Physical, chart, labs and discussed the procedure including the risks, benefits and alternatives for the proposed anesthesia with the patient or authorized representative  who has indicated his/her understanding and acceptance.     Dental Advisory Given  Plan Discussed with: CRNA and Surgeon  Anesthesia Plan Comments: (Patient consented for risks of anesthesia including but not limited to:  - adverse reactions to medications - risk of airway placement if required - damage to eyes, teeth, lips or other oral mucosa - nerve damage due to positioning  - sore throat or hoarseness - Damage to heart, brain, nerves, lungs, other parts of body or loss of life  Patient voiced understanding.)        Anesthesia Quick Evaluation

## 2022-07-08 NOTE — Discharge Instructions (Addendum)
Urolift Post-Operative Instructions     Patient Expectations   1. Mild blood in your urine for about 1 week.  2. Urinary buring, frequency, and urgency for 10 days.  3. Mild pelvic pain 1-2 weeks.     Return to Activity     1. Drink water post procedure.  2. Take meds as needed.  Tylenol and/or Motrin is most helpful.  You may also by Pyridium/Azo over-the-counter for urinary burning.  3. No lifting or straining 48hrs.  4. Other activity when they feel up to it.  5. STOP flomax after 2 weeks post op.    AMBULATORY SURGERY  DISCHARGE INSTRUCTIONS   The drugs that you were given will stay in your system until tomorrow so for the next 24 hours you should not:  Drive an automobile Make any legal decisions Drink any alcoholic beverage   You may resume regular meals tomorrow.  Today it is better to start with liquids and gradually work up to solid foods.  You may eat anything you prefer, but it is better to start with liquids, then soup and crackers, and gradually work up to solid foods.   Please notify your doctor immediately if you have any unusual bleeding, trouble breathing, redness and pain at the surgery site, drainage, fever, or pain not relieved by medication.    Additional Instructions:        Please contact your physician with any problems or Same Day Surgery at [phone], Monday through Friday 6 am to 4 pm, or [place] [facility] number at [phone].

## 2022-07-08 NOTE — H&P (Signed)
07/08/22  RRR CTAB  Matthew Webster April 26, 1950 [id number]   Referring provider: Dorothey Baseman, MD [phone] [place],  [place] [address]      Chief Complaint  Patient presents with   Follow-up      HPI: 72 year-old male who returns today for his routine annual follow up.    He has a personal history of a negative prostate biopsy in 2015 when his PSA was 5 and his TRUS volume was 19. An MRI in 2017 showed a PI-RADS 2 lesion.    His most recent PSA on 05/13/2022 was 4.4.    He is currently on Flomax and takes tadalafil as needed.    He reports considering the UroLift procedure due to persistent urinary symptoms despite being on Flomax, which he states does not fully alleviate his symptoms.    He has previously undergone cystoscopy and ultrasound evaluations as part of his diagnostic workup.           Results for orders placed or performed in visit on 06/05/22  BLADDER SCAN AMB NON-IMAGING  Result Value Ref Range    Scan Result 41 ml            IPSS       Row Name 06/05/22 1500                   International Prostate Symptom Score    How often have you had the sensation of not emptying your bladder? About half the time        How often have you had to urinate less than every two hours? Less than 1 in 5 times        How often have you found you stopped and started again several times when you urinated? More than half the time        How often have you found it difficult to postpone urination? Less than half the time        How often have you had a weak urinary stream? About half the time        How often have you had to strain to start urination? Less than half the time        How many times did you typically get up at night to urinate? 2 Times        Total IPSS Score 17               Quality of Life due to urinary symptoms    If you were to spend the rest of your life with your urinary condition just the way it is now how would you feel about that? Mixed                       Score:  1-7 Mild 8-19 Moderate 20-35 Severe     PMH:     Past Medical History:  Diagnosis Date   Actinic keratosis     Anxiety     Benign non-nodular prostatic hyperplasia with lower urinary tract symptoms     Cancer of sigmoid colon (HCC) 06/20/2014   Depression 11/24/2013    Overview:  Questionable bipolar   Diverticulosis     Fatty liver      on CT   Hemorrhoids     History of chicken pox     Hx of basal cell carcinoma 12/24/2016    Left anterior ear helix   Hx of basal cell carcinoma 06/24/2017  Left upper forehead   Hx of basal cell carcinoma 08/14/2017    Right upper forehead   Hypertension 11/24/2013   Personal history of colonic polyps 02/24/2014      Surgical History:      Past Surgical History:  Procedure Laterality Date   CATARACT EXTRACTION W/ INTRAOCULAR LENS IMPLANT Left 2011   CATARACT EXTRACTION W/PHACO Right 05/26/2019    Procedure: CATARACT EXTRACTION PHACO AND INTRAOCULAR LENS PLACEMENT (IOC) RIGHT 10.64  01:10.2  15.1%;  Surgeon: Lockie Mola, MD;  Location: [facility];  Service: Ophthalmology;  Laterality: Right;   COLON SURGERY       COLONOSCOPY WITH PROPOFOL N/A 07/22/2017    Procedure: COLONOSCOPY WITH PROPOFOL;  Surgeon: Christena Deem, MD;  Location: [facility] ENDOSCOPY;  Service: Endoscopy;  Laterality: N/A;   COLONOSCOPY WITH PROPOFOL N/A 07/21/2019    Procedure: COLONOSCOPY WITH PROPOFOL;  Surgeon: Toledo, Boykin Nearing, MD;  Location: [facility] ENDOSCOPY;  Service: Gastroenterology;  Laterality: N/A;   DIAGNOSTIC LAPAROSCOPY       ESOPHAGOGASTRODUODENOSCOPY N/A 07/21/2019    Procedure: ESOPHAGOGASTRODUODENOSCOPY (EGD);  Surgeon: Toledo, Boykin Nearing, MD;  Location: [facility] ENDOSCOPY;  Service: Gastroenterology;  Laterality: N/A;   EYE SURGERY        DETACHED RETINA   LAPAROSCOPIC BOWEL RESECTION       RETINAL DETACHMENT SURGERY       TONSILLECTOMY       WISDOM TOOTH EXTRACTION          Home Medications:   Allergies as of 06/05/2022         Reactions    Alphagan P [brimonidine Tartrate] Swelling    Eye redness, swelling, and discharge    Codeine      constipation    Shellfish Allergy Diarrhea, Nausea And Vomiting    Raw clams    Penicillin V Potassium Rash    Penicillins Rash            Medication List           Accurate as of Jun 05, 2022  3:38 PM. If you have any questions, ask your nurse or doctor.              latanoprost 0.005 % ophthalmic solution Commonly known as: XALATAN Place 1 drop into both eyes at bedtime.    losartan-hydrochlorothiazide 50-12.5 MG tablet Commonly known as: HYZAAR Take 1 tablet by mouth daily.    sildenafil 20 MG tablet Commonly known as: REVATIO Take 20 mg by mouth as needed.    tamsulosin 0.4 MG Caps capsule Commonly known as: FLOMAX TAKE 1 CAPSULE(0.4 MG) BY MOUTH DAILY    timolol 0.5 % ophthalmic solution Commonly known as: TIMOPTIC instill 1 drop into left eye every morning    venlafaxine XR 150 MG 24 hr capsule Commonly known as: EFFEXOR-XR Take 150 mg by mouth daily with breakfast.             Allergies:       Allergies  Allergen Reactions   Alphagan P [Brimonidine Tartrate] Swelling      Eye redness, swelling, and discharge   Codeine        constipation   Shellfish Allergy Diarrhea and Nausea And Vomiting      Raw clams   Penicillin V Potassium Rash   Penicillins Rash      Family History:      Family History  Problem Relation Age of Onset   CVA Mother     Diabetes Father  Prostate cancer Neg Hx     Bladder Cancer Neg Hx     Kidney cancer Neg Hx        Social History:  reports that he has never smoked. He has never used smokeless tobacco. He reports current alcohol use of about 21.0 standard drinks of alcohol per week. He reports that he does not use drugs.     Physical Exam: BP (!) 155/87   Pulse 69   Ht 5\' 6"  (1.676 m)   Wt 196 lb (88.9 kg)   BMI 31.64 kg/m   Constitutional:  Alert and  oriented, No acute distress. HEENT: [place] AT, moist mucus membranes.  Trachea midline, no masses. Neurologic: Grossly intact, no focal deficits, moving all 4 extremities. Psychiatric: Normal mood and affect.     Assessment & Plan:     1. BPH with LUTs - DRE deferred today given PSA stability   - He continues to have refractory symptoms despite being on Flomax. He has previously considered Urolift. He has had a full workup and is an excellent candidate for this and is now interested in proceeding.    -We discussed risks and benefits including risk of pelvic pain, failure of the procedure, infection and bleeding, amongst others.  We discussed the risk of clinical Gratian, functional efficacy.  We discussed the pre-op and post-op course. All questions were answered and we will proceed as planned.    Return for Urolift.     [facility] [address] [place], [place] [address] [phone]

## 2022-07-08 NOTE — Transfer of Care (Signed)
Immediate Anesthesia Transfer of Care Note  Patient: Matthew Webster  Procedure(s) Performed: CYSTOSCOPY WITH INSERTION OF UROLIFT (Prostate)  Patient Location: PACU  Anesthesia Type:MAC  Level of Consciousness: awake, alert , and oriented  Airway & Oxygen Therapy: Patient Spontanous Breathing and Patient connected to face mask oxygen  Post-op Assessment: Report given to RN and Post -op Vital signs reviewed and stable  Post vital signs: Reviewed, stable  Last Vitals:  Vitals Value Taken Time  BP 118/98 07/08/22 1500  Temp 35.8 1500  Pulse 78 07/08/22 1504  Resp 17 07/08/22 1504  SpO2 96 % 07/08/22 1503  Vitals shown include unvalidated device data.  Last Pain:  Vitals:   07/08/22 1241  TempSrc: Oral  PainSc: 0-No pain         Complications: No notable events documented.

## 2022-07-08 NOTE — Op Note (Signed)
Preoperative diagnosis: BPH with obstructive symptomatology   Postoperative diagnosis: BPH with obstructive symptomatology   Principal procedure: Urolift procedure, with the placement of 6 implants.   Surgeon: Vanna Scotland   Anesthesia: MAC   Complications: None   Drains: None   Estimated blood loss: < 5 mL   Indications: 72 year-old male with obstructive symptomatology secondary to BPH.  The patient's symptoms have progressed, and he has requested further management.  Management options including TURP with resection/ablation of the prostate as well as Urolift were discussed.  The patient has chosen to have a Urolift procedure.  He has been instructed to the procedure as well as risks and complications which include but are not limited to infection, bleeding, and inadequate treatment with the Urolift procedure alone, anesthetic complications, among others.  He understands these and desires to proceed.   Findings: Using the 17 French cystoscope, urethra and bladder were inspected.  There were no urethral lesions.  Prostatic urethra was obstructed secondary to bilobar hypertrophy.  The bladder was inspected circumferentially.  This revealed normal findings.   Description of procedure: The patient was properly identified in the holding area.  He received preoperative IV antibiotics.  He was taken to the operating room where MAC.  He is placed in the dorsolithotomy position.  Genitalia and perineum were prepped and draped.  Proper timeout was performed.   A 74F cystoscope was inserted into the bladder with findings as described above.  The 1st pair of implants were placed at the bladder neck ~1.5 cm from the bladder Neck. The 2nd pair of implants were placed at the level of the verumontanum.   A repeat cysto was performed and another pair of implants in between the prior pairs.    A final cystoscopy was conducted first to inspect the location and state of each implant and second, to confirm  the presence of a continuous anterior channel was present through the prostatic urethra with irrigation flow turned off.    6 implants were delivered in total.    Following this, the scope was removed.  After anesthetic reversal he was transported to the PACU in stable condition.  He tolerated the procedure well.   Plan: He will follow-up with me in 4 to 6 weeks for IPSS/PVR.

## 2022-07-09 ENCOUNTER — Encounter: Payer: Self-pay | Admitting: Urology

## 2022-07-09 NOTE — Anesthesia Postprocedure Evaluation (Signed)
Anesthesia Post Note  Patient: Matthew Webster  Procedure(s) Performed: CYSTOSCOPY WITH INSERTION OF UROLIFT (Prostate)  Patient location during evaluation: PACU Anesthesia Type: MAC Level of consciousness: awake and alert Pain management: pain level controlled Vital Signs Assessment: post-procedure vital signs reviewed and stable Respiratory status: spontaneous breathing, nonlabored ventilation, respiratory function stable and patient connected to nasal cannula oxygen Cardiovascular status: stable and blood pressure returned to baseline Postop Assessment: no apparent nausea or vomiting Anesthetic complications: no   No notable events documented.   Last Vitals:  Vitals:   07/08/22 1530 07/08/22 1559  BP: (!) 151/90   Pulse: 79   Resp: 17   Temp: (!) 36.3 C (!) 36.3 C  SpO2: 97%     Last Pain:  Vitals:   07/08/22 1530  TempSrc: Temporal  PainSc: 5                  Corinda Gubler

## 2022-08-12 ENCOUNTER — Other Ambulatory Visit: Payer: Self-pay | Admitting: Urology

## 2022-08-14 ENCOUNTER — Ambulatory Visit (INDEPENDENT_AMBULATORY_CARE_PROVIDER_SITE_OTHER): Payer: Medicare Other | Admitting: Urology

## 2022-08-14 VITALS — BP 156/91 | HR 78 | Ht 66.0 in | Wt 199.0 lb

## 2022-08-14 DIAGNOSIS — R972 Elevated prostate specific antigen [PSA]: Secondary | ICD-10-CM | POA: Diagnosis not present

## 2022-08-14 DIAGNOSIS — N401 Enlarged prostate with lower urinary tract symptoms: Secondary | ICD-10-CM

## 2022-08-14 LAB — BLADDER SCAN AMB NON-IMAGING: Scan Result: 90

## 2022-08-14 MED ORDER — TAMSULOSIN HCL 0.4 MG PO CAPS
0.4000 mg | ORAL_CAPSULE | Freq: Every day | ORAL | 3 refills | Status: DC
Start: 2022-08-14 — End: 2023-05-09

## 2022-08-14 NOTE — Progress Notes (Signed)
Marcelle Overlie Plume,acting as a scribe for Vanna Scotland, MD.,have documented all relevant documentation on the behalf of Vanna Scotland, MD,as directed by  Vanna Scotland, MD while in the presence of Vanna Scotland, MD.  08/14/2022 2:30 PM   Maxcine Ham 1950/02/01 [id number]  Referring provider: Dorothey Baseman, MD [phone] [place],  [place] [address]  Chief Complaint Patient presents with  Follow-up  Elevated PSA   HPI: 72 year-old male who presents today for 6 week follow up UroLift.   He has a personal history of elevated PSA and poorly controlled urinary symptoms. He is on Flomax and tadalafil as needed. His TRUS volume was only 19 cc's. He also has a personal history of elevated PSA up to 5 in 2015 and underwent a negative prostate biopsy in 2015. He had a prostate MRI in 2017, which had a PI-RADS 2 lesion.   His most recent PSA on 05/13/2022 was 4.4, which is within his normal range.   Today, he reports that he had stopped taking Flomax but found that it was more effective then the procedure and has resumed taking it every other day. He experienced significant burning and blood in the urine immediately post-procedure, which has since resolved. He reports that the surgery has provided some improvement but not as much as expected.   Results for orders placed or performed in visit on 08/14/22 BLADDER SCAN AMB NON-IMAGING Result Value Ref Range Scan Result 90 ml  IPSS  Row Name 08/14/22 1300 International Prostate Symptom Score How often have you had the sensation of not emptying your bladder? About half the time How often have you had to urinate less than every two hours? About half the time How often have you found you stopped and started again several times when you urinated? More than half the time How often have you found it difficult to postpone urination? More than half the time How often have you had a weak urinary stream? Almost always How often  have you had to strain to start urination? Not at All How many times did you typically get up at night to urinate? 3 Times Total IPSS Score 22 Quality of Life due to urinary symptoms If you were to spend the rest of your life with your urinary condition just the way it is now how would you feel about that? Mixed    Score:  1-7 Mild 8-19 Moderate 20-35 Severe    PMH: Past Medical History: Diagnosis Date  Actinic keratosis  Anxiety  Benign non-nodular prostatic hyperplasia with lower urinary tract symptoms  Cancer of sigmoid colon (HCC) 06/20/2014  Depression 11/24/2013 Overview:  Questionable bipolar  Detached retina, left 2010  Diverticulosis  Fatty liver on CT  Hemorrhoids  History of chicken pox  Hx of basal cell carcinoma 12/24/2016 Left anterior ear helix  Hx of basal cell carcinoma 06/24/2017 Left upper forehead  Hx of basal cell carcinoma 08/14/2017 Right upper forehead  Hypertension 11/24/2013  Personal history of colonic polyps 02/24/2014  Recurrent syncope   Surgical History: Past Surgical History: Procedure Laterality Date  CATARACT EXTRACTION W/ INTRAOCULAR LENS IMPLANT Left 2011  CATARACT EXTRACTION W/PHACO Right 05/26/2019 Procedure: CATARACT EXTRACTION PHACO AND INTRAOCULAR LENS PLACEMENT (IOC) RIGHT 10.64  01:10.2  15.1%;  Surgeon: Lockie Mola, MD;  Location: [facility];  Service: Ophthalmology;  Laterality: Right;  COLONOSCOPY 2016  COLONOSCOPY WITH PROPOFOL N/A 07/22/2017 Procedure: COLONOSCOPY WITH PROPOFOL;  Surgeon: Christena Deem, MD;  Location: [facility] ENDOSCOPY;  Service:  Endoscopy;  Laterality: N/A;  COLONOSCOPY WITH PROPOFOL N/A 07/21/2019 Procedure: COLONOSCOPY WITH PROPOFOL;  Surgeon: Toledo, Boykin Nearing, MD;  Location: [facility] ENDOSCOPY;  Service: Gastroenterology;  Laterality: N/A;  CYSTOSCOPY WITH INSERTION OF UROLIFT N/A 07/08/2022 Procedure: CYSTOSCOPY WITH INSERTION OF  UROLIFT;  Surgeon: Vanna Scotland, MD;  Location: [facility] ORS;  Service: Urology;  Laterality: N/A;  DIAGNOSTIC LAPAROSCOPY  ELBOW FRACTURE SURGERY Left screws and plate  ESOPHAGOGASTRODUODENOSCOPY N/A 07/21/2019 Procedure: ESOPHAGOGASTRODUODENOSCOPY (EGD);  Surgeon: Toledo, Boykin Nearing, MD;  Location: [facility] ENDOSCOPY;  Service: Gastroenterology;  Laterality: N/A;  LAPAROSCOPIC BOWEL RESECTION 8 inches colon removed with polyps  RETINAL DETACHMENT SURGERY Left 10/24/2008  TONSILLECTOMY  WISDOM TOOTH EXTRACTION   Home Medications:  Allergies as of 08/14/2022     Reactions Alphagan P [brimonidine Tartrate] Swelling Eye redness, swelling, and discharge Codeine constipation Shellfish Allergy Diarrhea, Nausea And Vomiting Raw clams Penicillin V Potassium Rash Penicillins Rash   Medication List   Accurate as of August 14, 2022  2:30 PM. If you have any questions, ask your nurse or doctor.   latanoprost 0.005 % ophthalmic solution Commonly known as: XALATAN Place 1 drop into both eyes at bedtime. losartan-hydrochlorothiazide 50-12.5 MG tablet Commonly known as: HYZAAR Take 1 tablet by mouth daily. sildenafil 20 MG tablet Commonly known as: REVATIO Take 20 mg by mouth as needed. tamsulosin 0.4 MG Caps capsule Commonly known as: FLOMAX Take 1 capsule (0.4 mg total) by mouth daily. What changed: See the new instructions. timolol 0.5 % ophthalmic solution Commonly known as: TIMOPTIC instill 1 drop into left eye every morning venlafaxine XR 150 MG 24 hr capsule Commonly known as: EFFEXOR-XR Take 150 mg by mouth at bedtime.    Allergies:  Allergies Allergen Reactions  Alphagan P [Brimonidine Tartrate] Swelling Eye redness, swelling, and discharge  Codeine constipation  Shellfish Allergy Diarrhea and Nausea And Vomiting Raw clams  Penicillin V Potassium Rash  Penicillins Rash   Family History: Family History Problem Relation Age of  Onset  CVA Mother  Diabetes Father  Prostate cancer Neg Hx  Bladder Cancer Neg Hx  Kidney cancer Neg Hx   Social History:  reports that he has never smoked. He has never used smokeless tobacco. He reports current alcohol use of about 21.0 standard drinks of alcohol per week. He reports that he does not use drugs.   Physical Exam: BP (!) 156/91   Pulse 78   Ht 5\' 6"  (1.676 m)   Wt 199 lb (90.3 kg)   BMI 32.12 kg/m   Constitutional:  Alert and oriented, No acute distress. HEENT: [place] AT, moist mucus membranes.  Trachea midline, no masses. Neurologic: Grossly intact, no focal deficits, moving all 4 extremities. Psychiatric: Normal mood and affect.  Assessment & Plan:    1. BPH - His PVR today is 90 mL -Overall, he does think that the UroLift was somewhat effective but its better combination with the Flomax which he would like to continue - Continue taking Flomax daily and a refill was provided - Discussed the importance of taking Flomax daily for optimal effectiveness.  2. History of elevated PSA - We will continue to check it annually as he has had extensive evaluation and was previously higher  Return in about 1 year (around 08/14/2023) for IPSS, PVR, PSA, and DRE.  I have reviewed the above documentation for accuracy and completeness, and I agree with the above.   Vanna Scotland, MD    [facility] [address], [address] [place], [place]  [address] [phone]   

## 2022-09-05 DIAGNOSIS — H401131 Primary open-angle glaucoma, bilateral, mild stage: Secondary | ICD-10-CM | POA: Diagnosis not present

## 2022-09-05 DIAGNOSIS — Z961 Presence of intraocular lens: Secondary | ICD-10-CM | POA: Diagnosis not present

## 2022-09-11 IMAGING — CT CT HEAD W/O CM
3 series · 15 of 47 positions shown, 18 images · non-contrast
Comparison: None.

CLINICAL DATA: Recent syncopal episode

EXAM:
CT HEAD WITHOUT CONTRAST
CT CERVICAL SPINE WITHOUT CONTRAST
TECHNIQUE: Multidetector CT imaging of the head and cervical spine was
performed following the standard protocol without intravenous
contrast. Multiplanar CT image reconstructions of the cervical spine
were also generated.

[Series 2: head wo · axial · 0.41mm/px · z∈[-162,-17]mm · 9 of 35 slices shown, 12 images]
[im 3/35  brain]
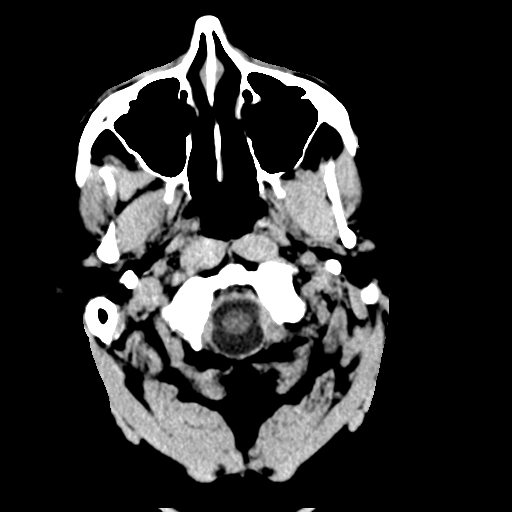
[im 3/35  bone]
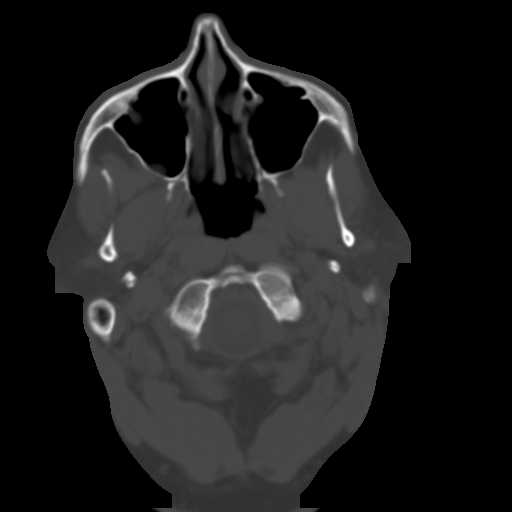
[im 6/35  brain]
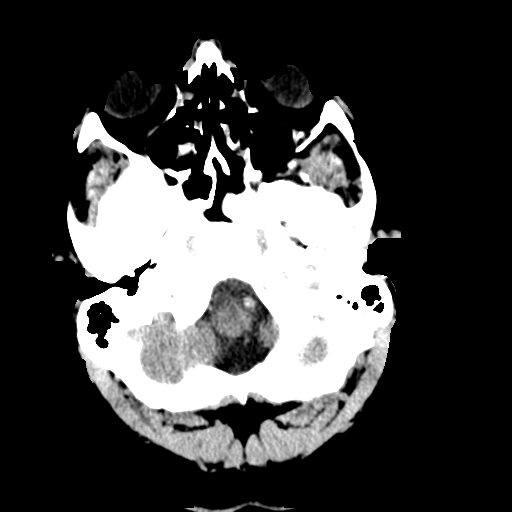
[im 10/35  brain]
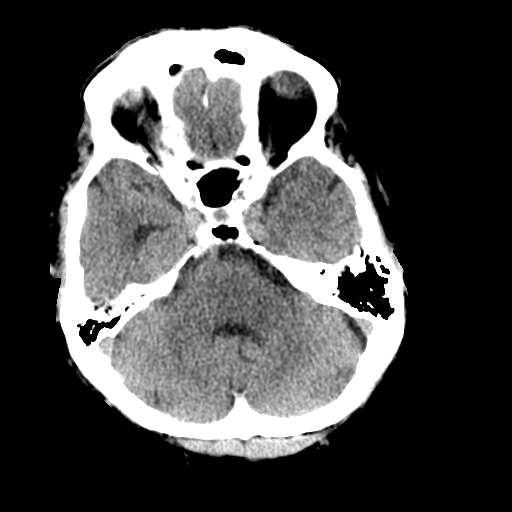
[im 13/35  brain]
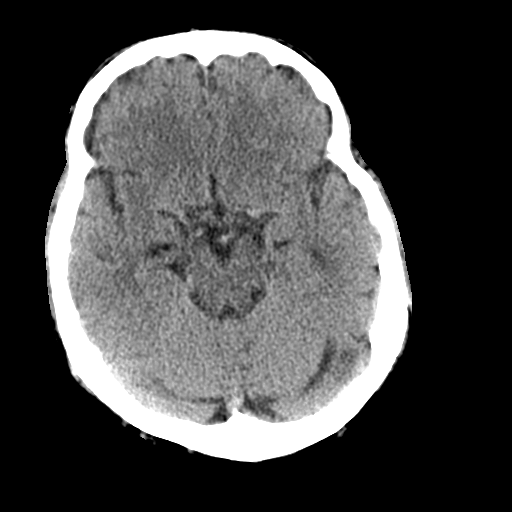
[im 18/35  brain]
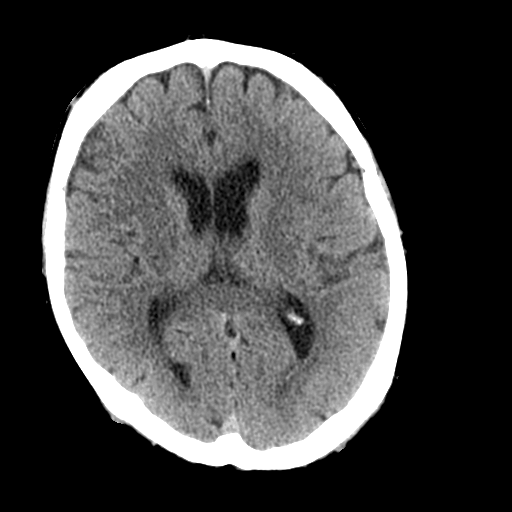
[im 18/35  bone]
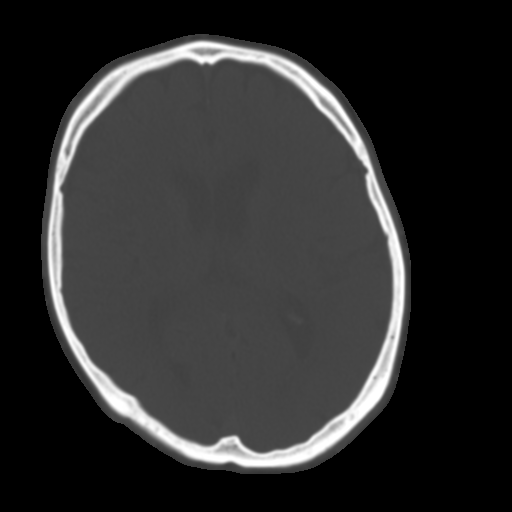
[im 22/35  brain]
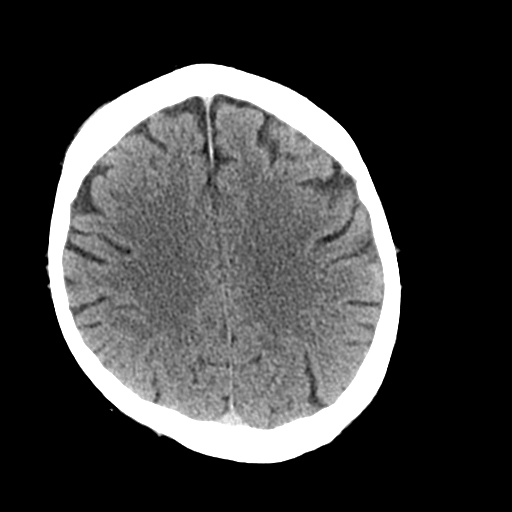
[im 25/35  brain]
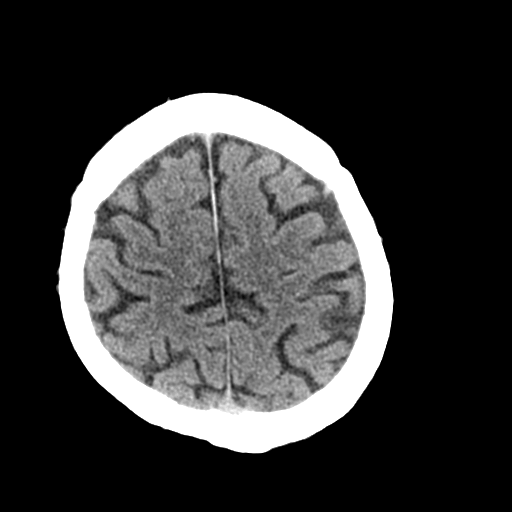
[im 29/35  brain]
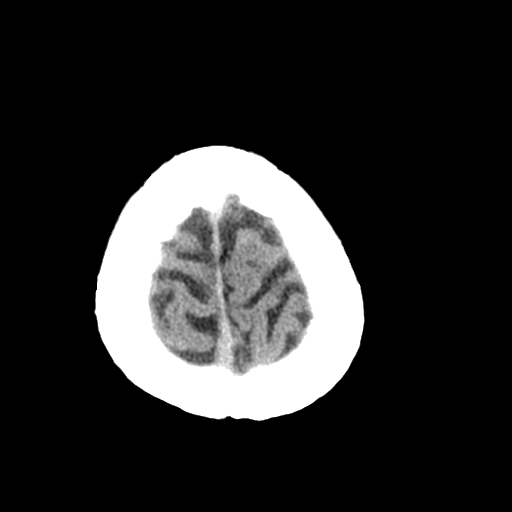
[im 32/35  brain]
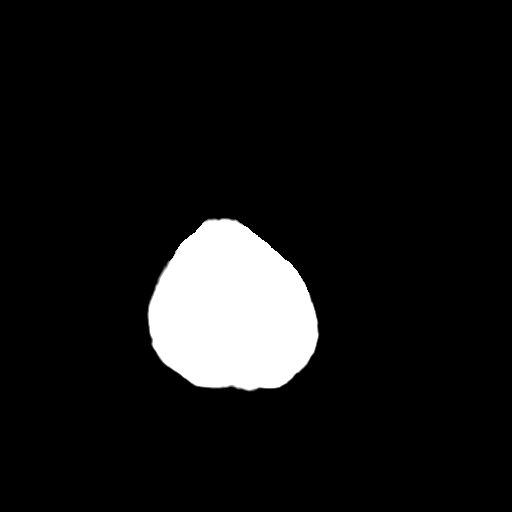
[im 32/35  bone]
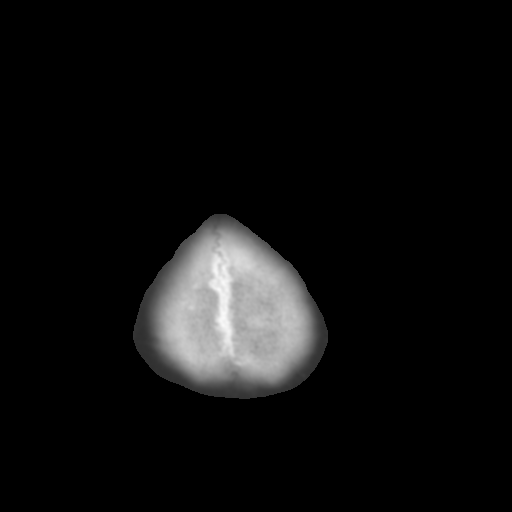

[Series 4: coronal soft tissue · coronal · 0.36mm/px · 3 of 72 slices shown]
[im 24/72  brain]
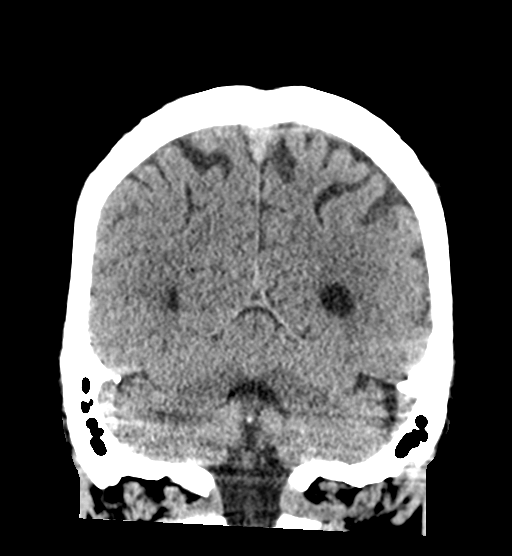
[im 32/72  brain]
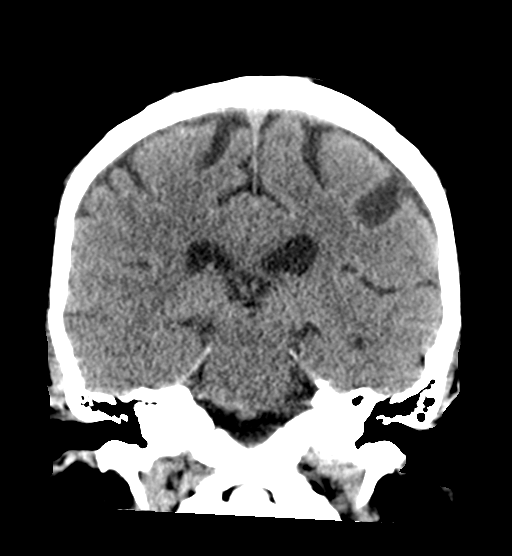
[im 40/72  brain]
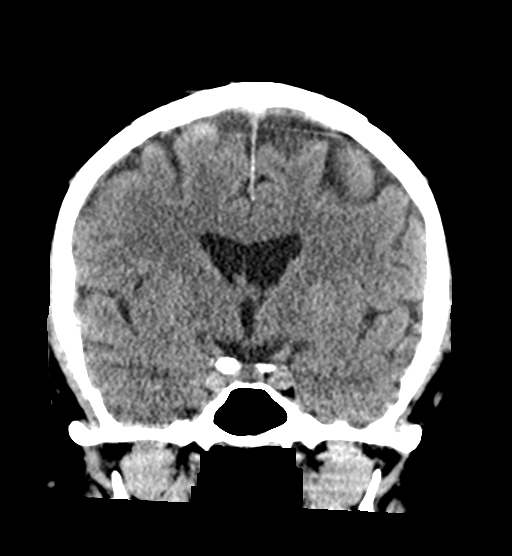

[Series 5: sagittal soft tissue · sagittal · 0.35mm/px · 3 of 58 slices shown]
[im 20/58  brain]
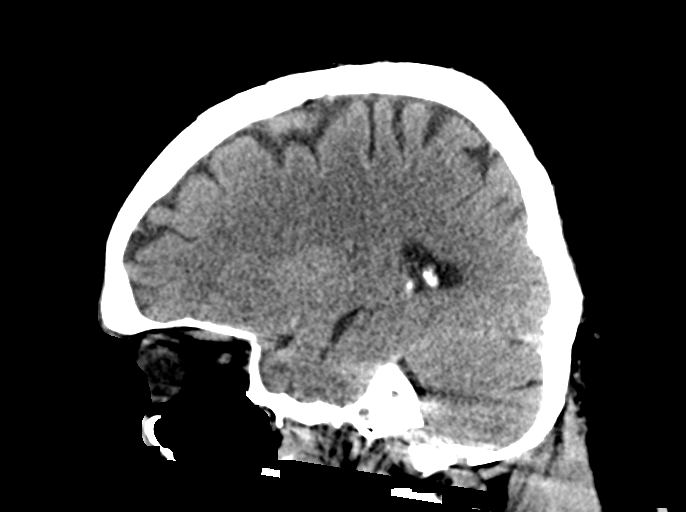
[im 29/58  brain]
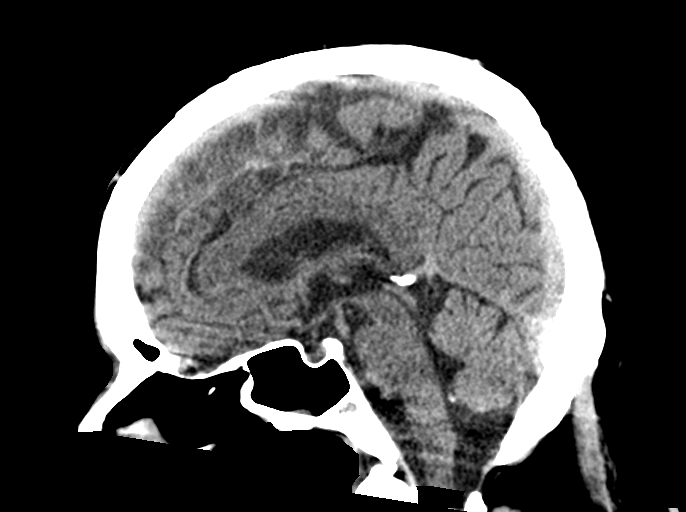
[im 39/58  brain]
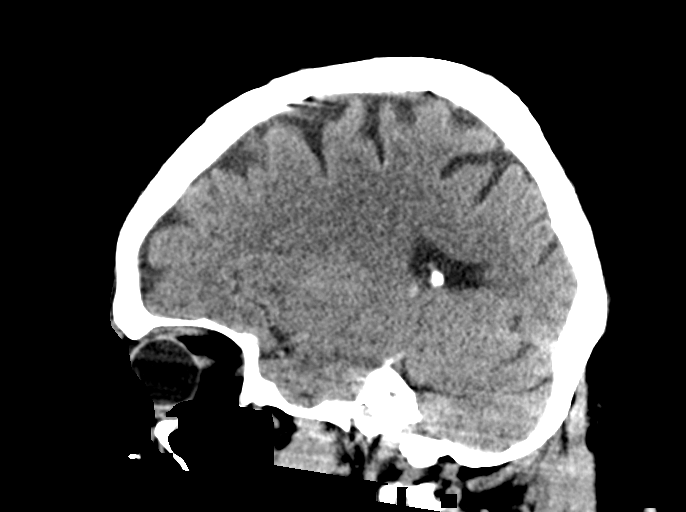

[15 of 47 positions shown; findings below may reference images not displayed]

FINDINGS: CT HEAD FINDINGS

Brain: No evidence of acute infarction, hemorrhage, hydrocephalus,
extra-axial collection or mass lesion/mass effect.

Vascular: No hyperdense vessel or unexpected calcification.

Skull: Normal. Negative for fracture or focal lesion.

Sinuses/Orbits: Mild mucosal thickening is noted within the ethmoid
and maxillary sinuses.

Other: None.

CT CERVICAL SPINE FINDINGS

Alignment: Within normal limits.

Skull base and vertebrae: 7 cervical segments are well visualized.
Vertebral body height is well maintained. Osteophytic changes are
noted throughout the cervical spine. Multilevel facet hypertrophic
changes are noted as well. No acute fracture or acute facet
abnormality is noted. Multilevel neural foraminal narrowing is seen.

Soft tissues and spinal canal: Surrounding soft tissue structures
show no acute abnormality.

Upper chest: Visualized lung apices are unremarkable.

Other: None
IMPRESSION: CT of the head: No acute intracranial abnormality noted.

Mild mucosal thickening in the paranasal sinuses.

CT of the cervical spine:

Multilevel degenerative changes without acute abnormality.

## 2022-09-11 IMAGING — DX DG HAND COMPLETE 3+V*R*
3 series · 3 of 3 positions shown · non-contrast
Comparison: Left hand radiograph August 04, 2017.

CLINICAL DATA: Thumb pain after fall.

EXAM:
RIGHT HAND - COMPLETE 3+ VIEW

[hand ap]
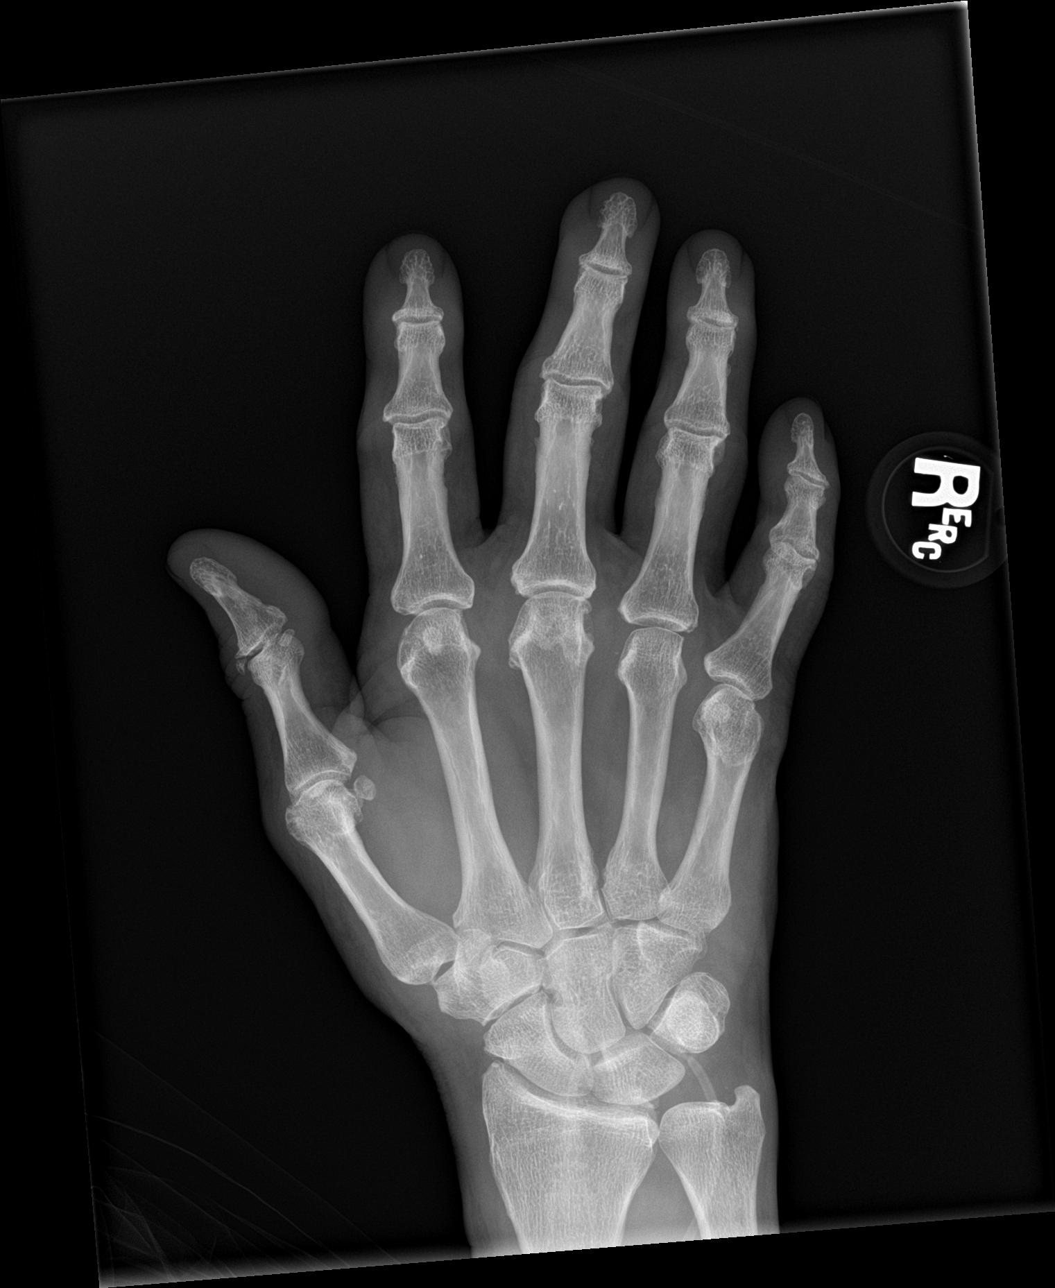

[hand obl]
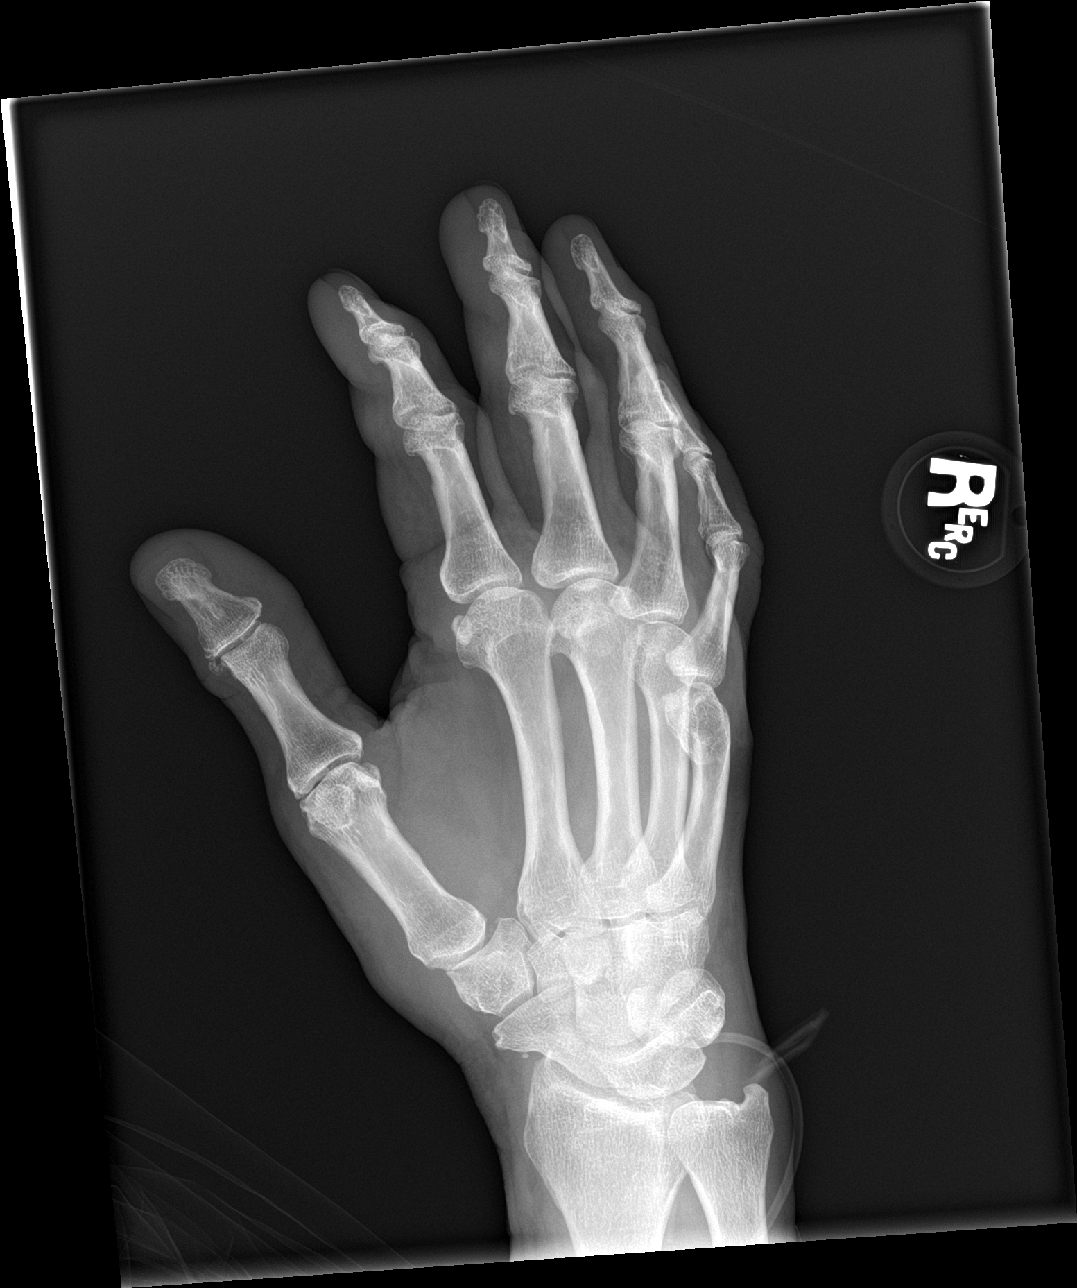

[hand lat]
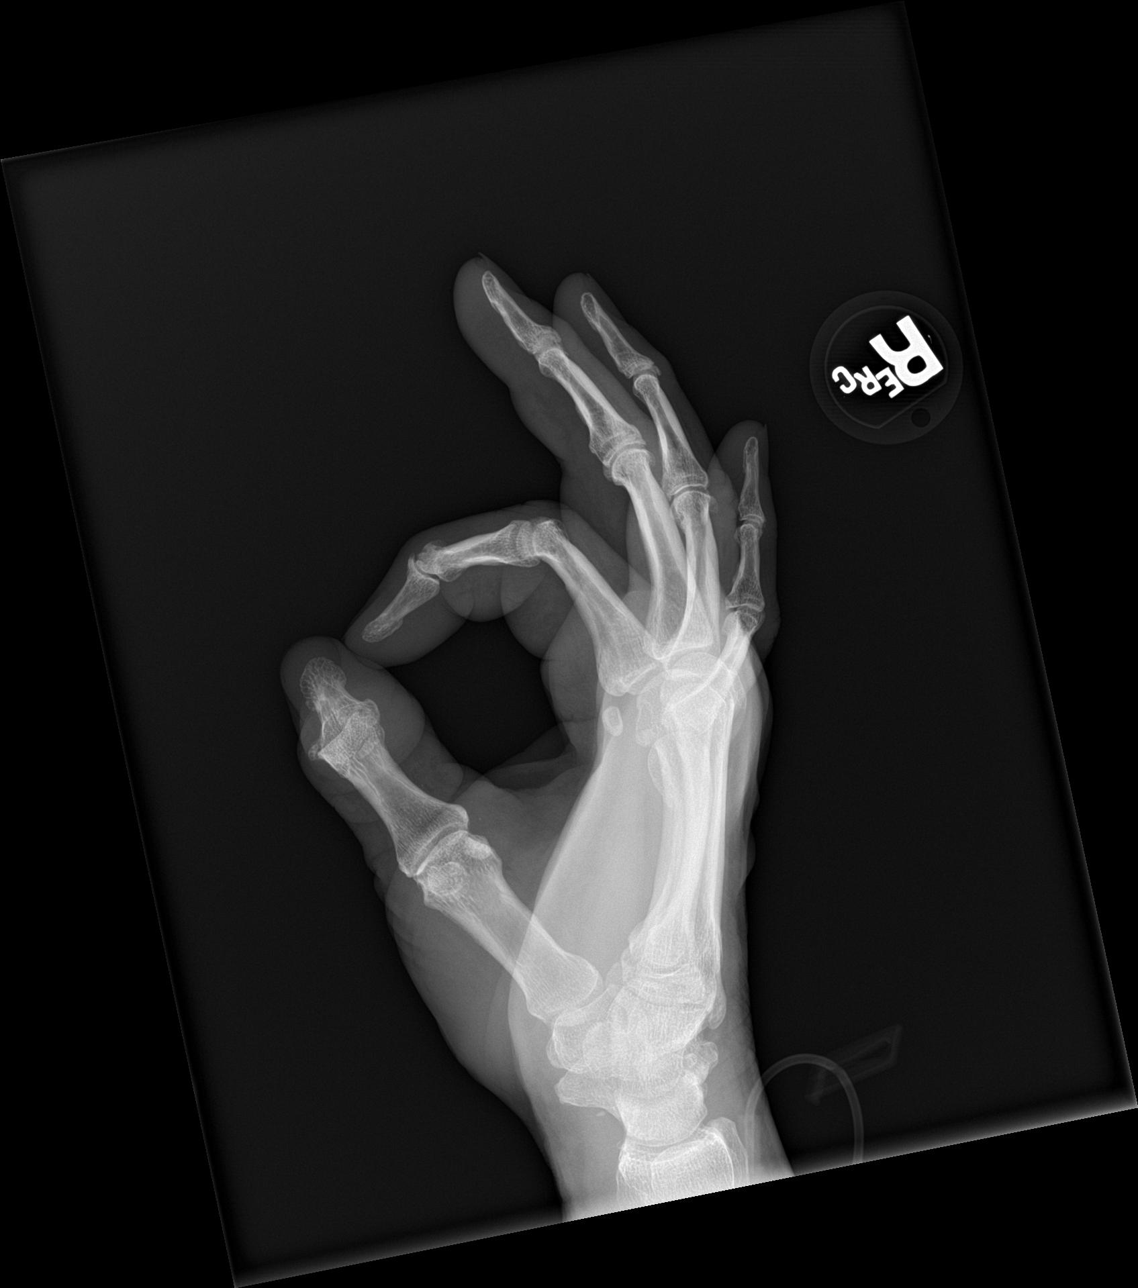

[3 of 3 positions shown; findings below may reference images not displayed]

FINDINGS: There is no evidence of fracture or dislocation. Mild degenerative
change of the interphalangeal joints. Soft tissues are unremarkable.
IMPRESSION: No fracture or dislocation of the right hand.

## 2022-09-12 IMAGING — US US CAROTID DUPLEX BILAT
1 series · 13 of 24 positions shown · non-contrast
Comparison: None

CLINICAL DATA: Recent syncopal episode

EXAM:
BILATERAL CAROTID DUPLEX ULTRASOUND
TECHNIQUE: Gray scale imaging, color Doppler and duplex ultrasound were
performed of bilateral carotid and vertebral arteries in the neck.

[Series 1: us carotid bilateral · 13 of 113 slices shown]
[im 1/113]
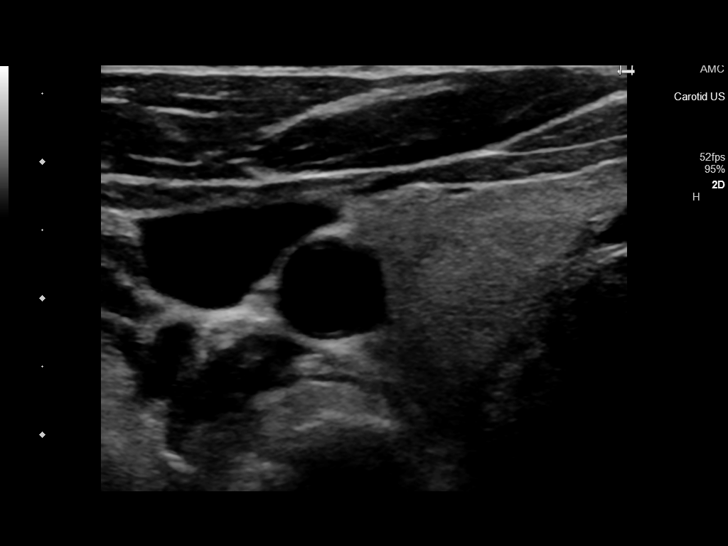
[im 10/113]
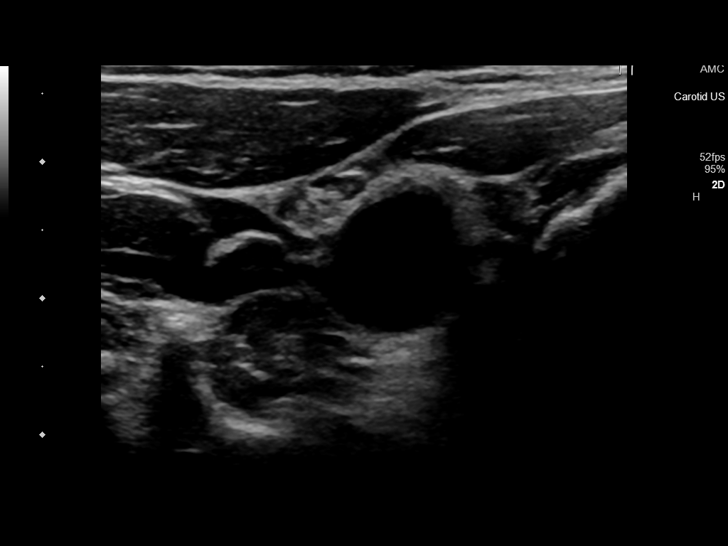
[im 20/113]
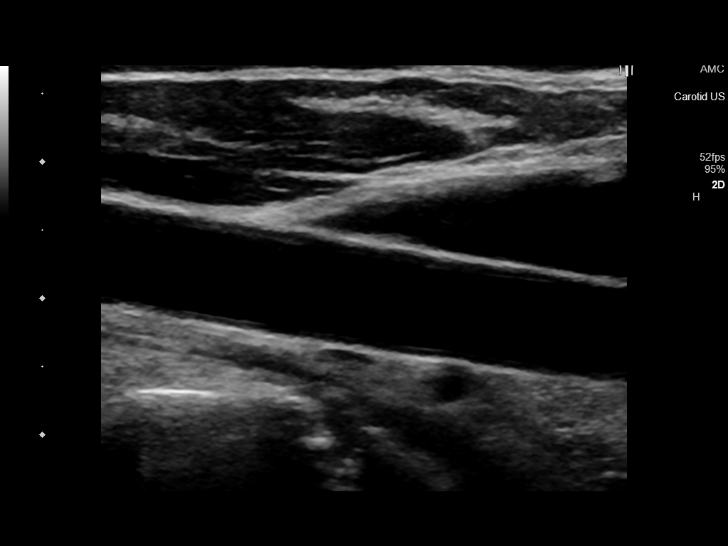
[im 30/113]
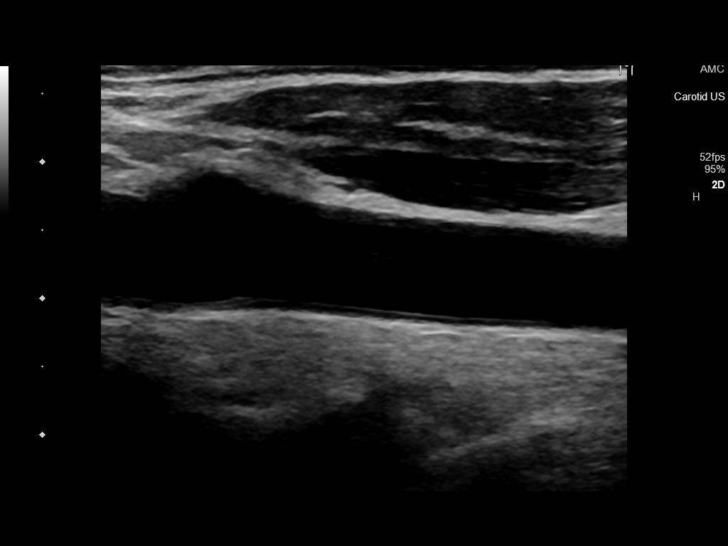
[im 39/113]
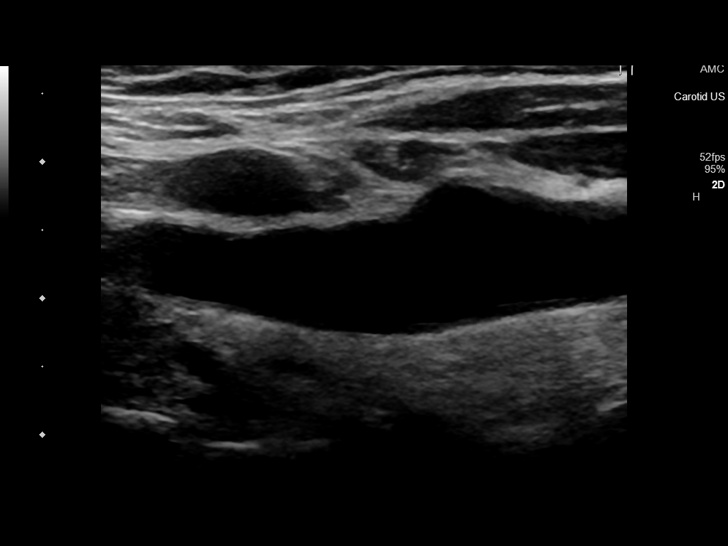
[im 49/113]
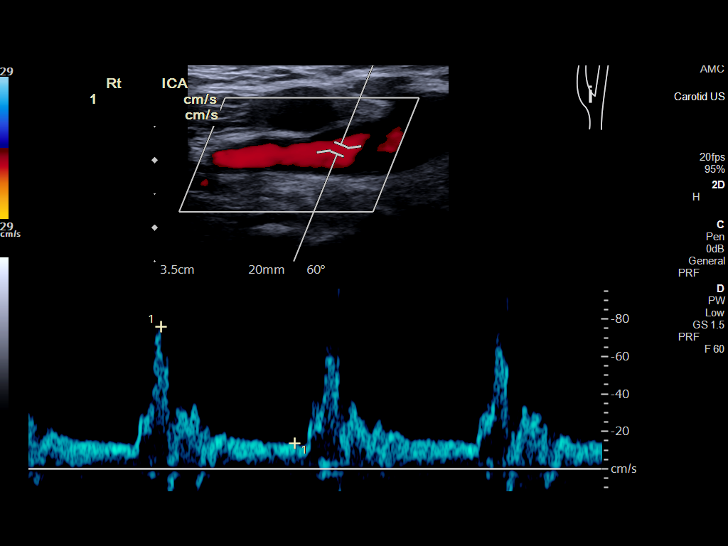
[im 59/113]
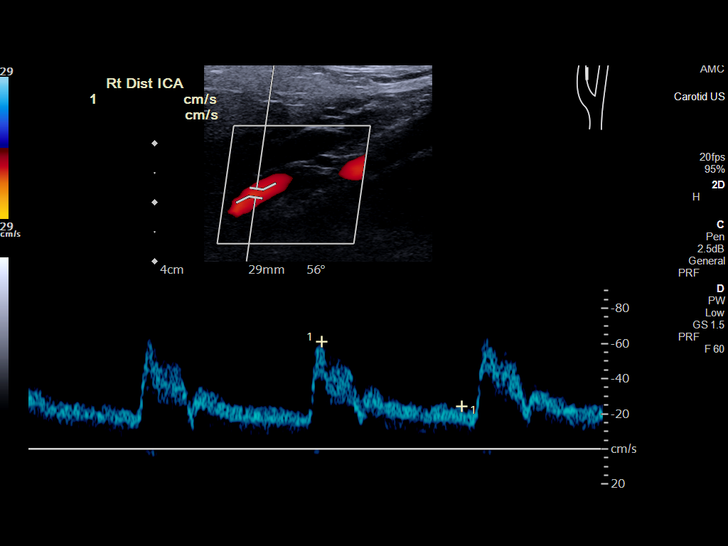
[im 64/113]
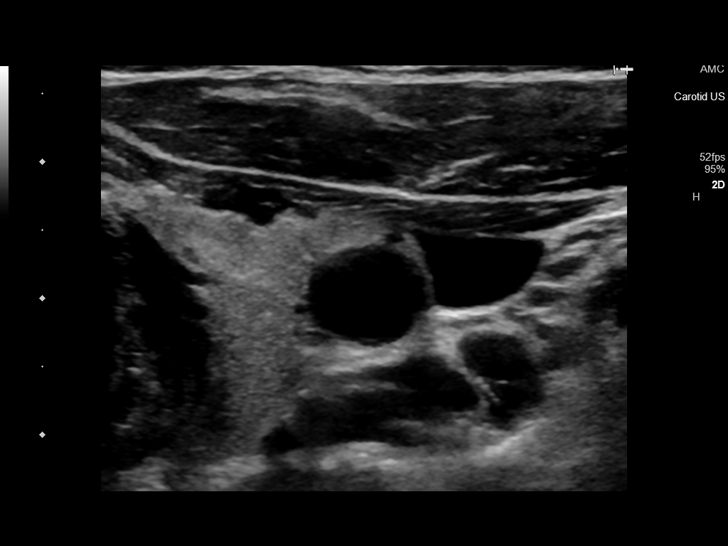
[im 74/113]
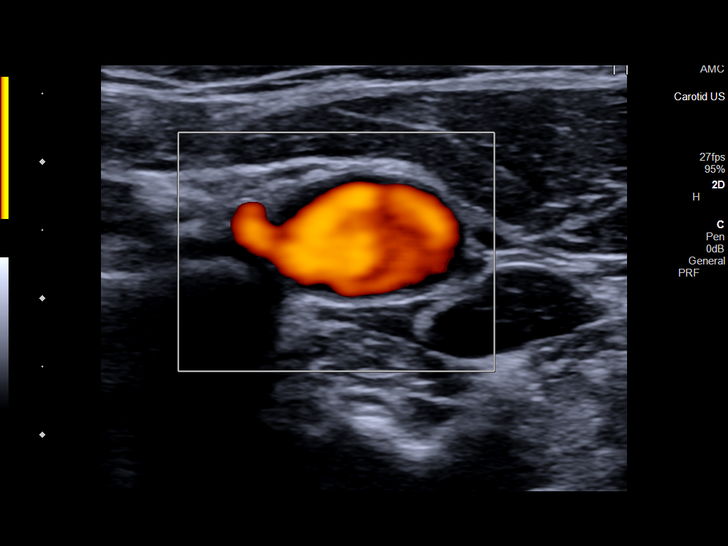
[im 83/113]
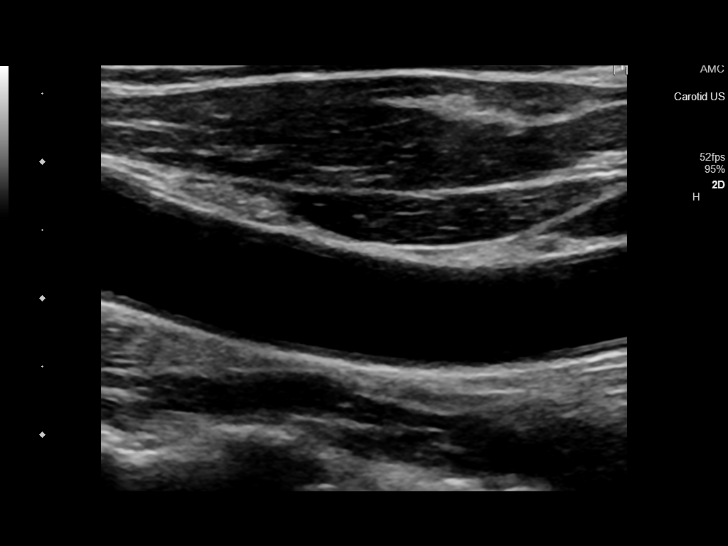
[im 93/113]
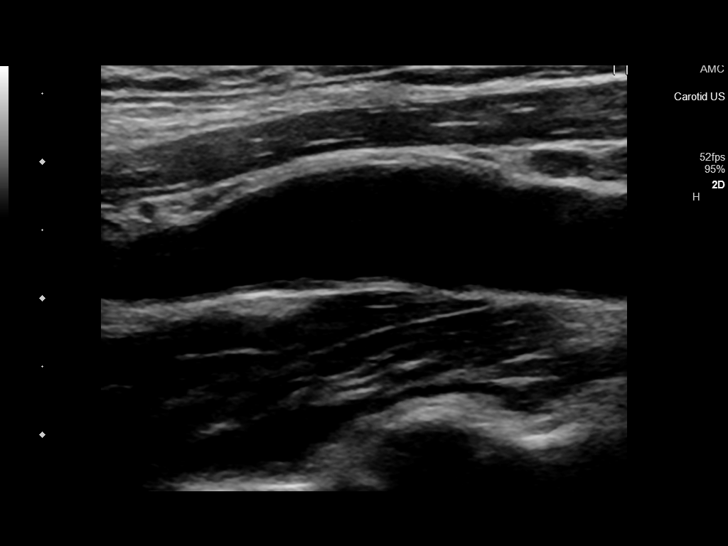
[im 103/113]
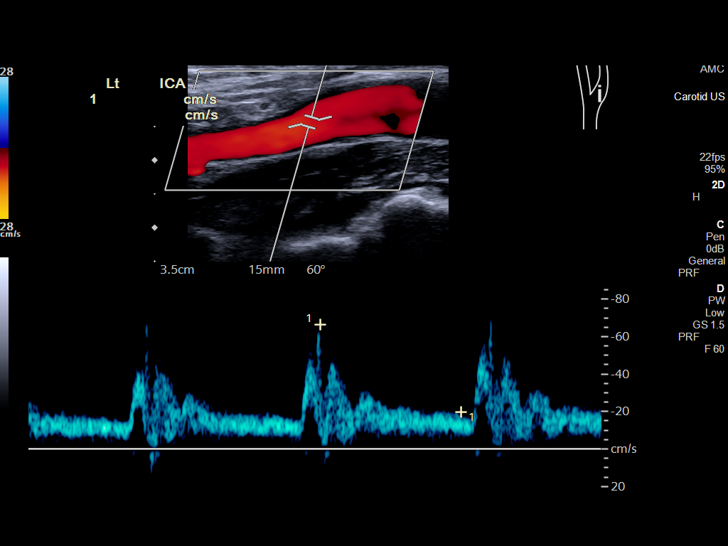
[im 113/113]
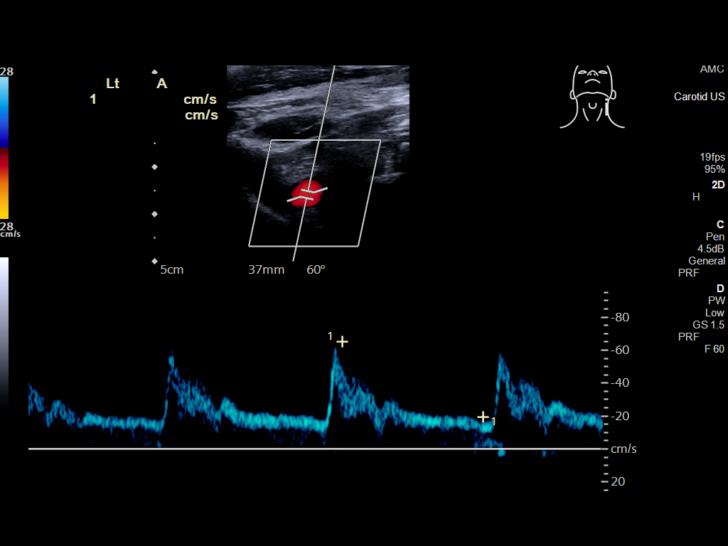

[13 of 24 positions shown; findings below may reference images not displayed]

FINDINGS: Criteria: Quantification of carotid stenosis is based on velocity
parameters that correlate the residual internal carotid diameter
with NASCET-based stenosis levels, using the diameter of the distal
internal carotid lumen as the denominator for stenosis measurement.

The following velocity measurements were obtained:

RIGHT

ICA: 76/14 cm/sec

CCA: 88/19 cm/sec

SYSTOLIC ICA/CCA RATIO:

ECA: 101 cm/sec

LEFT

ICA: 80/31 cm/sec

CCA: 88/25 cm/sec

SYSTOLIC ICA/CCA RATIO:

ECA: 136 cm/sec

RIGHT CAROTID ARTERY: Evaluation of preliminary grayscale images
show no significant atherosclerotic plaque. Evaluation the
waveforms, velocities and flow velocity ratios show no evidence of
focal hemodynamically significant stenosis.

RIGHT VERTEBRAL ARTERY:  Antegrade in nature.

LEFT CAROTID ARTERY: Evaluation preliminary grayscale images
demonstrate no significant atherosclerotic plaque. The waveforms,
velocities and flow velocity ratios show no evidence of focal
hemodynamically significant stenosis.

LEFT VERTEBRAL ARTERY:  Antegrade in nature.
IMPRESSION: No evidence of hemodynamically significant stenosis in the carotid
arteries.

## 2022-10-16 ENCOUNTER — Other Ambulatory Visit: Payer: Self-pay | Admitting: Urology

## 2022-10-16 DIAGNOSIS — N138 Other obstructive and reflux uropathy: Secondary | ICD-10-CM

## 2022-11-06 DIAGNOSIS — N400 Enlarged prostate without lower urinary tract symptoms: Secondary | ICD-10-CM | POA: Diagnosis not present

## 2022-11-06 DIAGNOSIS — F32A Depression, unspecified: Secondary | ICD-10-CM | POA: Diagnosis not present

## 2022-11-06 DIAGNOSIS — I10 Essential (primary) hypertension: Secondary | ICD-10-CM | POA: Diagnosis not present

## 2022-11-06 DIAGNOSIS — F1010 Alcohol abuse, uncomplicated: Secondary | ICD-10-CM | POA: Diagnosis not present

## 2022-11-06 DIAGNOSIS — E119 Type 2 diabetes mellitus without complications: Secondary | ICD-10-CM | POA: Diagnosis not present

## 2022-11-11 ENCOUNTER — Other Ambulatory Visit: Payer: Self-pay | Admitting: Urology

## 2022-11-11 DIAGNOSIS — N401 Enlarged prostate with lower urinary tract symptoms: Secondary | ICD-10-CM

## 2022-11-19 ENCOUNTER — Ambulatory Visit: Payer: Medicare Other | Admitting: Dermatology

## 2022-11-19 VITALS — BP 141/71 | HR 77

## 2022-11-19 DIAGNOSIS — L570 Actinic keratosis: Secondary | ICD-10-CM | POA: Diagnosis not present

## 2022-11-19 DIAGNOSIS — W908XXA Exposure to other nonionizing radiation, initial encounter: Secondary | ICD-10-CM

## 2022-11-19 DIAGNOSIS — L821 Other seborrheic keratosis: Secondary | ICD-10-CM

## 2022-11-19 DIAGNOSIS — L720 Epidermal cyst: Secondary | ICD-10-CM | POA: Diagnosis not present

## 2022-11-19 DIAGNOSIS — Z1283 Encounter for screening for malignant neoplasm of skin: Secondary | ICD-10-CM

## 2022-11-19 DIAGNOSIS — L738 Other specified follicular disorders: Secondary | ICD-10-CM

## 2022-11-19 DIAGNOSIS — L578 Other skin changes due to chronic exposure to nonionizing radiation: Secondary | ICD-10-CM | POA: Diagnosis not present

## 2022-11-19 DIAGNOSIS — Z85828 Personal history of other malignant neoplasm of skin: Secondary | ICD-10-CM

## 2022-11-19 NOTE — Patient Instructions (Addendum)
Cryotherapy Aftercare  Wash gently with soap and water everyday.   Apply Vaseline and Band-Aid daily until healed.   Seborrheic Keratosis  What causes seborrheic keratoses? Seborrheic keratoses are harmless, common skin growths that first appear during adult life.  As time goes by, more growths appear.  Some people may develop a large number of them.  Seborrheic keratoses appear on both covered and uncovered body parts.  They are not caused by sunlight.  The tendency to develop seborrheic keratoses can be inherited.  They vary in color from skin-colored to gray, brown, or even black.  They can be either smooth or have a rough, warty surface.   Seborrheic keratoses are superficial and look as if they were stuck on the skin.  Under the microscope this type of keratosis looks like layers upon layers of skin.  That is why at times the top layer may seem to fall off, but the rest of the growth remains and re-grows.    Treatment Seborrheic keratoses do not need to be treated, but can easily be removed in the office.  Seborrheic keratoses often cause symptoms when they rub on clothing or jewelry.  Lesions can be in the way of shaving.  If they become inflamed, they can cause itching, soreness, or burning.  Removal of a seborrheic keratosis can be accomplished by freezing, burning, or surgery. If any spot bleeds, scabs, or grows rapidly, please return to have it checked, as these can be an indication of a skin cancer.  Due to recent changes in healthcare laws, you may see results of your pathology and/or laboratory studies on MyChart before the doctors have had a chance to review them. We understand that in some cases there may be results that are confusing or concerning to you. Please understand that not all results are received at the same time and often the doctors may need to interpret multiple results in order to provide you with the best plan of care or course of treatment. Therefore, we ask that you  please give [place] 2 business days to thoroughly review all your results before contacting the office for clarification. Should we see a critical lab result, you will be contacted sooner.   If You Need Anything After Your Visit  If you have any questions or concerns for your doctor, please call our main line at [phone] and press option 4 to reach your doctor's medical assistant. If no one answers, please leave a voicemail as directed and we will return your call as soon as possible. Messages left after 4 pm will be answered the following business day.   You may also send [place] a message via MyChart. We typically respond to MyChart messages within 1-2 business days.  For prescription refills, please ask your pharmacy to contact our office. Our fax number is [phone].  If you have an urgent issue when the clinic is closed that cannot wait until the next business day, you can page your doctor at the number below.    Please note that while we do our best to be available for urgent issues outside of office hours, we are not available 24/7.   If you have an urgent issue and are unable to reach [place], you may choose to seek medical care at your doctor's office, retail clinic, urgent care center, or emergency room.  If you have a medical emergency, please immediately call 911 or go to the emergency department.  Pager Numbers  - Dr. Gwen Pounds: [phone]  -  Dr. Roseanne Reno: [phone]  - Dr. Katrinka Blazing: [phone]   In the event of inclement weather, please call our main line at [phone] for an update on the status of any delays or closures.  Dermatology Medication Tips: Please keep the boxes that topical medications come in in order to help keep track of the instructions about where and how to use these. Pharmacies typically print the medication instructions only on the boxes and not directly on the medication tubes.   If your medication is too expensive, please contact our office at  [phone] option 4 or send [place] a message through MyChart.   We are unable to tell what your co-pay for medications will be in advance as this is different depending on your insurance coverage. However, we may be able to find a substitute medication at lower cost or fill out paperwork to get insurance to cover a needed medication.   If a prior authorization is required to get your medication covered by your insurance company, please allow [place] 1-2 business days to complete this process.  Drug prices often vary depending on where the prescription is filled and some pharmacies may offer cheaper prices.  The website www.goodrx.com contains coupons for medications through different pharmacies. The prices here do not account for what the cost may be with help from insurance (it may be cheaper with your insurance), but the website can give you the price if you did not use any insurance.  - You can print the associated coupon and take it with your prescription to the pharmacy.  - You may also stop by our office during regular business hours and pick up a GoodRx coupon card.  - If you need your prescription sent electronically to a different pharmacy, notify our office through [facility] or by phone at [phone] option 4.     Si Usted Necesita Algo Despus de Su Visita  Tambin puede enviarnos un mensaje a travs de Clinical cytogeneticist. Por lo general respondemos a los mensajes de MyChart en el transcurso de 1 a 2 das hbiles.  Para renovar recetas, por favor pida a su farmacia que se ponga en contacto con nuestra oficina. Annie Sable de fax es [place] [phone].  Si tiene un asunto urgente cuando la clnica est cerrada y que no puede esperar hasta el siguiente da hbil, puede llamar/localizar a su doctor(a) al nmero que aparece a continuacin.   Por favor, tenga en cuenta que aunque hacemos todo lo posible para estar disponibles para asuntos urgentes fuera del horario de [place], no estamos  disponibles las 24 horas del da, los 7 [address]  [address] de la [place].   Si tiene un problema urgente y no puede comunicarse con nosotros, puede optar por buscar atencin mdica  en el consultorio de su doctor(a), en una clnica privada, en un centro de atencin urgente o en una sala de emergencias.  Si tiene Engineer, drilling, por favor llame inmediatamente al 911 o vaya a la sala de emergencias.  Nmeros de bper  - Dr. Gwen Pounds: [phone]  - Dra. Roseanne Reno: [phone]  - Dr. Katrinka Blazing: [phone]   En caso de inclemencias del tiempo, por favor llame a Lacy Duverney principal al [phone] para una actualizacin sobre el [place] de cualquier retraso o cierre.  Consejos para la medicacin en dermatologa: Por favor, guarde las cajas en las que vienen los medicamentos de uso tpico para ayudarle a seguir las instrucciones sobre dnde y cmo usarlos. Las farmacias generalmente imprimen las instrucciones del medicamento slo en las  cajas y no directamente en los tubos del medicamento.   Si su medicamento es muy caro, por favor, pngase en contacto con Rolm Gala llamando al [phone] y presione la opcin 4 o envenos un mensaje a travs de Clinical cytogeneticist.   No podemos decirle cul ser su copago por los medicamentos por adelantado ya que esto es diferente dependiendo de la cobertura de su seguro. Sin embargo, es posible que podamos encontrar un medicamento sustituto a Audiological scientist un formulario para que el seguro cubra el medicamento que se considera necesario.   Si se requiere una autorizacin previa para que su compaa de seguros [place] su medicamento, por favor permtanos de 1 a 2 das hbiles para completar [address]  Los precios de los medicamentos varan con frecuencia dependiendo del Environmental consultant de dnde se surte la receta y alguna farmacias pueden ofrecer precios ms baratos.  El sitio web www.goodrx.com tiene cupones para medicamentos de Health and safety inspector. Los precios aqu no  tienen en cuenta lo que podra costar con la ayuda del seguro (puede ser ms barato con su seguro), pero el sitio web puede darle el precio si no utiliz Tourist information centre manager.  - Puede imprimir el cupn correspondiente y llevarlo con su receta a la farmacia.  - Tambin puede pasar por nuestra oficina durante el horario de atencin regular y Education officer, museum una tarjeta de cupones de GoodRx.  - Si necesita que su receta se enve electrnicamente a una farmacia diferente, informe a nuestra oficina a travs de MyChart de [place] o por telfono llamando al [phone] y presione la opcin 4.

## 2022-11-19 NOTE — Progress Notes (Signed)
Follow-Up Visit   Subjective  Matthew Webster is a 72 y.o. male who presents for the following: Actinic keratosis, 6 month follow-up.  The patient has spots, moles and lesions to be evaluated, some may be new or changing, including right clavicle area.  History of BCCs.  The following portions of the chart were reviewed this encounter and updated as appropriate: medications, allergies, medical history  Review of Systems:  No other skin or systemic complaints except as noted in HPI or Assessment and Plan.  Objective  Well appearing patient in no apparent distress; mood and affect are within normal limits.  A focused examination was performed of the following areas: Face, arms, scalp  Relevant exam findings are noted in the Assessment and Plan.  R lower cheek x 4, R temple x 1, L ant upper ear helix x 1, R forehead x 1, vertex scalp x 2, L malar cheek x 1 (10) Pink scaly macules.     Assessment & Plan   AK (actinic keratosis) (10) R lower cheek x 4, R temple x 1, L ant upper ear helix x 1, R forehead x 1, vertex scalp x 2, L malar cheek x 1  Actinic keratoses are precancerous spots that appear secondary to cumulative UV radiation exposure/sun exposure over time. They are chronic with expected duration over 1 year. A portion of actinic keratoses will progress to squamous cell carcinoma of the skin. It is not possible to reliably predict which spots will progress to skin cancer and so treatment is recommended to prevent development of skin cancer.  Recommend daily broad spectrum sunscreen SPF 30+ to sun-exposed areas, reapply every 2 hours as needed.  Recommend staying in the shade or wearing long sleeves, sun glasses (UVA+UVB protection) and wide brim hats (4-inch brim around the entire circumference of the hat). Call for new or changing lesions.  Destruction of lesion - R lower cheek x 4, R temple x 1, L ant upper ear helix x 1, R forehead x 1, vertex scalp x 2, L malar cheek x 1  (10)  Destruction method: cryotherapy   Informed consent: discussed and consent obtained   Lesion destroyed using liquid nitrogen: Yes   Region frozen until ice ball extended beyond lesion: Yes   Outcome: patient tolerated procedure well with no complications   Post-procedure details: wound care instructions given   Additional details:  Prior to procedure, discussed risks of blister formation, small wound, skin dyspigmentation, or rare scar following cryotherapy. Recommend Vaseline ointment to treated areas while healing.    ACTINIC DAMAGE - chronic, secondary to cumulative UV radiation exposure/sun exposure over time - diffuse scaly erythematous macules with underlying dyspigmentation - Recommend daily broad spectrum sunscreen SPF 30+ to sun-exposed areas, reapply every 2 hours as needed.  - Recommend staying in the shade or wearing long sleeves, sun glasses (UVA+UVB protection) and wide brim hats (4-inch brim around the entire circumference of the hat). - Call for new or changing lesions.   SEBORRHEIC KERATOSIS - Stuck-on, waxy, tan-brown papules and/or plaques, including right clavicle  - Benign-appearing - Discussed benign etiology and prognosis. - Observe - Call for any changes  HISTORY OF BASAL CELL CARCINOMA OF THE SKIN - No evidence of recurrence today R and L forehead, L ear helix - Recommend regular full body skin exams - Recommend daily broad spectrum sunscreen SPF 30+ to sun-exposed areas, reapply every 2 hours as needed.  - Call if any new or changing lesions are noted between  office visits  Milia - tiny firm white papules - type of cyst - benign - sometimes these will clear with nightly OTC adapalene/Differin 0.1% gel or retinol. - may be extracted if symptomatic - observe  Sebaceous Hyperplasia - Small yellow papules with a central dell - Benign-appearing - Observe. Call for changes.   Return in about 6 months (around 05/20/2023) for UBSE, Hx AKs, Hx  BCC.  ICherlyn Labella, CMA, am acting as scribe for Willeen Niece, MD .   Documentation: I have reviewed the above documentation for accuracy and completeness, and I agree with the above.  Willeen Niece, MD

## 2022-11-27 ENCOUNTER — Ambulatory Visit (INDEPENDENT_AMBULATORY_CARE_PROVIDER_SITE_OTHER): Payer: Medicare Other | Admitting: Nurse Practitioner

## 2022-11-27 ENCOUNTER — Encounter: Payer: Self-pay | Admitting: Nurse Practitioner

## 2022-11-27 VITALS — BP 134/82 | HR 68 | Temp 98.7°F | Resp 16 | Ht 66.0 in | Wt 213.2 lb

## 2022-11-27 DIAGNOSIS — R748 Abnormal levels of other serum enzymes: Secondary | ICD-10-CM | POA: Diagnosis not present

## 2022-11-27 DIAGNOSIS — F331 Major depressive disorder, recurrent, moderate: Secondary | ICD-10-CM

## 2022-11-27 DIAGNOSIS — F1020 Alcohol dependence, uncomplicated: Secondary | ICD-10-CM | POA: Diagnosis not present

## 2022-11-27 DIAGNOSIS — E1169 Type 2 diabetes mellitus with other specified complication: Secondary | ICD-10-CM | POA: Diagnosis not present

## 2022-11-28 ENCOUNTER — Telehealth: Payer: Self-pay | Admitting: Nurse Practitioner

## 2022-11-28 DIAGNOSIS — R748 Abnormal levels of other serum enzymes: Secondary | ICD-10-CM | POA: Insufficient documentation

## 2022-11-28 DIAGNOSIS — E119 Type 2 diabetes mellitus without complications: Secondary | ICD-10-CM | POA: Insufficient documentation

## 2022-11-28 NOTE — Telephone Encounter (Signed)
Awaiting 11/27/22 office notes for Psychology referral-Toni

## 2022-11-29 ENCOUNTER — Telehealth: Payer: Self-pay | Admitting: Nurse Practitioner

## 2022-11-29 NOTE — Telephone Encounter (Signed)
Psychology referral faxed [facility]; [phone] Notified patient. Gave pt telephone # [phone]

## 2022-12-03 ENCOUNTER — Other Ambulatory Visit: Payer: Medicare Other

## 2022-12-04 ENCOUNTER — Inpatient Hospital Stay
Admission: RE | Admit: 2022-12-04 | Discharge: 2022-12-04 | Discharge status: Home / Self Care | Payer: Medicare Other | Source: Ambulatory Visit | Attending: Nurse Practitioner

## 2022-12-04 DIAGNOSIS — R945 Abnormal results of liver function studies: Secondary | ICD-10-CM | POA: Diagnosis not present

## 2022-12-04 DIAGNOSIS — R748 Abnormal levels of other serum enzymes: Secondary | ICD-10-CM

## 2022-12-07 NOTE — Progress Notes (Signed)
We will discuss results at upcoming appointment on 11/20

## 2022-12-10 DIAGNOSIS — J40 Bronchitis, not specified as acute or chronic: Secondary | ICD-10-CM | POA: Diagnosis not present

## 2022-12-10 DIAGNOSIS — J329 Chronic sinusitis, unspecified: Secondary | ICD-10-CM | POA: Diagnosis not present

## 2022-12-10 DIAGNOSIS — B9689 Other specified bacterial agents as the cause of diseases classified elsewhere: Secondary | ICD-10-CM | POA: Diagnosis not present

## 2022-12-17 DIAGNOSIS — K080 Exfoliation of teeth due to systemic causes: Secondary | ICD-10-CM | POA: Diagnosis not present

## 2022-12-18 ENCOUNTER — Encounter: Payer: Self-pay | Admitting: Nurse Practitioner

## 2022-12-18 ENCOUNTER — Ambulatory Visit (INDEPENDENT_AMBULATORY_CARE_PROVIDER_SITE_OTHER): Payer: Medicare Other | Admitting: Nurse Practitioner

## 2022-12-18 VITALS — BP 130/88 | HR 69 | Temp 98.3°F | Resp 16 | Ht 66.0 in | Wt 212.4 lb

## 2022-12-18 DIAGNOSIS — K769 Liver disease, unspecified: Secondary | ICD-10-CM | POA: Diagnosis not present

## 2022-12-18 DIAGNOSIS — F1020 Alcohol dependence, uncomplicated: Secondary | ICD-10-CM | POA: Diagnosis not present

## 2022-12-18 NOTE — Progress Notes (Signed)
[facility] [address] [place], [place] [address]  Internal MEDICINE  Office Visit Note  Patient Name: Matthew Webster  [id number]  [id number]  Date of Service: 12/18/2022  Chief Complaint  Patient presents with   Depression   Hypertension   Follow-up    Review u/s    HPI Matthew Webster presents for a follow-up visit for liver ultrasound results and alcohol use.  Liver US -- fatty liver and a nonspecific liver lesion less than 1 cm in diameter. MRI of abdomen is recommended to get a better view of the liver lesion and rule out possible tumor/malignancy.  Alcohol dependence -- wants to quit, plans to decrease gradually. No significant change yet but patient is optimistic that he will be able to gradually wean down his alcohol use.     Current Medication: Outpatient Encounter Medications as of 12/18/2022  Medication Sig   latanoprost (XALATAN) 0.005 % ophthalmic solution Place 1 drop into both eyes at bedtime.   losartan-hydrochlorothiazide (HYZAAR) 50-12.5 MG tablet Take 1 tablet by mouth daily.   sildenafil (REVATIO) 20 MG tablet Take 20 mg by mouth as needed.   tamsulosin (FLOMAX) 0.4 MG CAPS capsule Take 1 capsule (0.4 mg total) by mouth daily.   timolol (TIMOPTIC) 0.5 % ophthalmic solution instill 1 drop into left eye every morning   venlafaxine XR (EFFEXOR-XR) 150 MG 24 hr capsule Take 150 mg by mouth at bedtime.   No facility-administered encounter medications on file as of 12/18/2022.    Surgical History: Past Surgical History:  Procedure Laterality Date   CATARACT EXTRACTION W/ INTRAOCULAR LENS IMPLANT Left 2011   CATARACT EXTRACTION W/PHACO Right 05/26/2019   Procedure: CATARACT EXTRACTION PHACO AND INTRAOCULAR LENS PLACEMENT (IOC) RIGHT 10.64  01:10.2  15.1%;  Surgeon: Lockie Mola, MD;  Location: [facility];  Service: Ophthalmology;  Laterality: Right;   COLONOSCOPY  2016   COLONOSCOPY WITH PROPOFOL N/A 07/22/2017   Procedure:  COLONOSCOPY WITH PROPOFOL;  Surgeon: Christena Deem, MD;  Location: [facility] END[facility];  Service: Endoscopy;  Laterality: N/A;   COLONOSCOPY WITH PROPOFOL N/A 07/21/2019   Procedure: COLONOSCOPY WITH PROPOFOL;  Surgeon: Toledo, Boykin Nearing, MD;  Location: [facility] ENDOSCOPY;  Service: Gastroenterology;  Laterality: N/A;   CYSTOSCOPY WITH INSERTION OF UROLIFT N/A 07/08/2022   Procedure: CYSTOSCOPY WITH INSERTION OF UROLIFT;  Surgeon: Vanna Scotland, MD;  Location: [facility] ORS;  Service: Urology;  Laterality: N/A;   DIAGNOSTIC LAPAROSCOPY     ELBOW FRACTURE SURGERY Left    screws and plate   ESOPHAGOGASTRODUODENOSCOPY N/A 07/21/2019   Procedure: ESOPHAGOGASTRODUODENOSCOPY (EGD);  Surgeon: Toledo, Boykin Nearing, MD;  Location: [facility] ENDOSCOPY;  Service: Gastroenterology;  Laterality: N/A;   LAPAROSCOPIC BOWEL RESECTION     8 inches colon removed with polyps   RETINAL DETACHMENT SURGERY Left 10/24/2008   TONSILLECTOMY     WISDOM TOOTH EXTRACTION      Medical History: Past Medical History:  Diagnosis Date   Actinic keratosis    Anxiety    Benign non-nodular prostatic hyperplasia with lower urinary tract symptoms    Cancer of sigmoid colon (HCC) 06/20/2014   Depression 11/24/2013   Overview:  Questionable bipolar   Detached retina, left 2010   Diverticulosis    Fatty liver    on CT   Hemorrhoids    History of chicken pox    Hx of basal cell carcinoma 12/24/2016   Left anterior ear helix   Hx of basal cell carcinoma 06/24/2017   Left upper forehead  Hx of basal cell carcinoma 08/14/2017   Right upper forehead   Hypertension 11/24/2013   Personal history of colonic polyps 02/24/2014   Recurrent syncope     Family History: Family History  Problem Relation Age of Onset   CVA Mother    Diabetes Father    Prostate cancer Neg Hx    Bladder Cancer Neg Hx    Kidney cancer Neg Hx     Social History   Socioeconomic History   Marital status: Married    Spouse name: Meriam Sprague   Number of  children: 1   Years of education: Not on file   Highest education level: Not on file  Occupational History   Not on file  Tobacco Use   Smoking status: Never   Smokeless tobacco: Never  Vaping Use   Vaping status: Never Used  Substance and Sexual Activity   Alcohol use: Yes    Alcohol/week: 8.0 standard drinks of alcohol    Types: 8 Cans of beer per week    Comment: 8 every night   Drug use: No   Sexual activity: Yes  Other Topics Concern   Not on file  Social History Narrative   Not on file   Social Determinants of Health   Financial Resource Strain: Low Risk  (11/06/2022)   Received from [facility]   Overall Financial Resource Strain (CARDIA)    Difficulty of Paying Living Expenses: Not very hard  Food Insecurity: No Food Insecurity (11/06/2022)   Received from [facility]   Hunger Vital Sign    Worried About Running Out of Food in the Last Year: Never true    Ran Out of Food in the Last Year: Never true  Transportation Needs: No Transportation Needs (11/06/2022)   Received from [facility] - Transportation    In the past 12 months, has lack of transportation kept you from medical appointments or from getting medications?: No    Lack of Transportation (Non-Medical): No  Physical Activity: Not on file  Stress: Not on file  Social Connections: Not on file  Intimate Partner Violence: Not on file      Review of Systems  Constitutional:  Positive for fatigue and unexpected weight change. Negative for chills.  HENT:  Positive for postnasal drip. Negative for congestion, rhinorrhea, sneezing and sore throat.   Eyes:  Negative for redness.  Respiratory:  Negative for cough, chest tightness, shortness of breath and wheezing.   Cardiovascular:  Negative for chest pain and palpitations.  Gastrointestinal:  Negative for abdominal pain, constipation, diarrhea, nausea and vomiting.  Endocrine: Positive for  polydipsia and polyuria.  Genitourinary:  Negative for dysuria and frequency.  Musculoskeletal:  Negative for arthralgias, back pain, joint swelling and neck pain.  Skin:  Negative for rash.  Neurological: Negative.  Negative for tremors and numbness.  Hematological:  Negative for adenopathy. Does not bruise/bleed easily.  Psychiatric/Behavioral:  Positive for behavioral problems (Depression) and decreased concentration. Negative for self-injury, sleep disturbance and suicidal ideas. The patient is not nervous/anxious.     Vital Signs: BP 130/88   Pulse 69   Temp 98.3 F (36.8 C)   Resp 16   Ht 5\' 6"  (1.676 m)   Wt 212 lb 6.4 oz (96.3 kg)   SpO2 96%   BMI 34.28 kg/m    Physical Exam Vitals reviewed.  Constitutional:      General: He is not in acute distress.  Appearance: Normal appearance. He is obese. He is not ill-appearing.  HENT:     Head: Normocephalic and atraumatic.  Eyes:     Pupils: Pupils are equal, round, and reactive to light.  Cardiovascular:     Rate and Rhythm: Normal rate and regular rhythm.  Pulmonary:     Effort: Pulmonary effort is normal. No respiratory distress.  Neurological:     Mental Status: He is alert and oriented to person, place, and time.  Psychiatric:        Mood and Affect: Mood normal.        Behavior: Behavior normal.        Assessment/Plan: 1. Liver lesion (Primary) MRI of abdomen ordered to get a better image of the possible liver lesion.  - MR Abdomen W Wo Contrast; Future  2. Alcohol use disorder, severe, dependence (HCC) Patient acknowledged will work on gradually weaning down his alcohol use.    General Counseling: Alphonsus Sias understanding of the findings of todays visit and agrees with plan of treatment. I have discussed any further diagnostic evaluation that may be needed or ordered today. We also reviewed his medications today. he has been encouraged to call the office with any questions or concerns that  should arise related to todays visit.    Orders Placed This Encounter  Procedures   MR Abdomen W Wo Contrast    No orders of the defined types were placed in this encounter.   Return in about 1 month (around 01/17/2023) for F/U, Neville Walston PCP alcohol use.   Total time spent:20 Minutes Time spent includes review of chart, medications, test results, and follow up plan with the patient.   [place] Controlled Substance Database was reviewed by me.  This patient was seen by Sallyanne Kuster, FNP-C in collaboration with Dr. Beverely Risen as a part of collaborative care agreement.   Carnell Casamento R. Tedd Sias, MSN, FNP-C Internal medicine

## 2023-01-07 ENCOUNTER — Telehealth: Payer: Self-pay | Admitting: Nurse Practitioner

## 2023-01-07 NOTE — Telephone Encounter (Signed)
Lvm to r/s 01/13/23 appointment until after MRI-Toni

## 2023-01-08 ENCOUNTER — Telehealth: Payer: Self-pay | Admitting: Nurse Practitioner

## 2023-01-08 NOTE — Telephone Encounter (Signed)
Left another vm to move 01/13/23 to after MRI-Toni

## 2023-01-13 ENCOUNTER — Ambulatory Visit: Payer: Medicare Other | Admitting: Nurse Practitioner

## 2023-01-16 ENCOUNTER — Encounter: Payer: Self-pay | Admitting: Internal Medicine

## 2023-01-17 ENCOUNTER — Encounter: Payer: Self-pay | Admitting: Internal Medicine

## 2023-01-17 ENCOUNTER — Ambulatory Visit
Admission: RE | Admit: 2023-01-17 | Discharge: 2023-01-17 | Disposition: A | Discharge status: Home / Self Care | Payer: Self-pay | Source: Ambulatory Visit | Attending: Nurse Practitioner | Admitting: Nurse Practitioner

## 2023-01-17 DIAGNOSIS — K769 Liver disease, unspecified: Secondary | ICD-10-CM | POA: Diagnosis not present

## 2023-01-17 MED ORDER — GADOPICLENOL 0.5 MMOL/ML IV SOLN
10.0000 mL | Freq: Once | INTRAVENOUS | Status: AC | PRN
  Administered 2023-01-17: 10 mL via INTRAVENOUS

## 2023-02-06 DIAGNOSIS — Z03818 Encounter for observation for suspected exposure to other biological agents ruled out: Secondary | ICD-10-CM | POA: Diagnosis not present

## 2023-02-06 DIAGNOSIS — J0100 Acute maxillary sinusitis, unspecified: Secondary | ICD-10-CM | POA: Diagnosis not present

## 2023-02-10 ENCOUNTER — Ambulatory Visit: Payer: Medicare Other | Admitting: Nurse Practitioner

## 2023-02-10 ENCOUNTER — Encounter: Payer: Self-pay | Admitting: Nurse Practitioner

## 2023-02-10 VITALS — BP 134/80 | HR 63 | Temp 98.1°F | Resp 16 | Ht 66.0 in | Wt 211.6 lb

## 2023-02-10 DIAGNOSIS — E1169 Type 2 diabetes mellitus with other specified complication: Secondary | ICD-10-CM

## 2023-02-10 DIAGNOSIS — K702 Alcoholic fibrosis and sclerosis of liver: Secondary | ICD-10-CM

## 2023-02-10 DIAGNOSIS — A0811 Acute gastroenteropathy due to Norwalk agent: Secondary | ICD-10-CM

## 2023-02-10 DIAGNOSIS — F1020 Alcohol dependence, uncomplicated: Secondary | ICD-10-CM | POA: Diagnosis not present

## 2023-02-10 NOTE — Progress Notes (Signed)
 [facility] [address] [place], [place] [address]  Internal MEDICINE  Office Visit Note  Patient Name: Matthew Webster  [id number]  [id number]  Date of Service: 02/10/2023  Chief Complaint  Patient presents with   Depression   Hypertension   Follow-up    Review MRI    HPI Matthew Webster presents for a follow-up visit for liver MRI results.  MRI of liver -- fibrosis and steatosis Drinks once or twice a week -- 4 25 ounce beers  Recent norovirus Current respiratory infection, was negative for covid, and is on an antibiotic currently.    Current Medication: Outpatient Encounter Medications as of 02/10/2023  Medication Sig   latanoprost  (XALATAN ) 0.005 % ophthalmic solution Place 1 drop into both eyes at bedtime.   losartan -hydrochlorothiazide  (HYZAAR) 50-12.5 MG tablet Take 1 tablet by mouth daily.   sildenafil  (REVATIO ) 20 MG tablet Take 20 mg by mouth as needed.   tamsulosin  (FLOMAX ) 0.4 MG CAPS capsule Take 1 capsule (0.4 mg total) by mouth daily.   timolol  (TIMOPTIC ) 0.5 % ophthalmic solution instill 1 drop into left eye every morning   venlafaxine  XR (EFFEXOR -XR) 150 MG 24 hr capsule Take 150 mg by mouth at bedtime.   No facility-administered encounter medications on file as of 02/10/2023.    Surgical History: Past Surgical History:  Procedure Laterality Date   CATARACT EXTRACTION W/ INTRAOCULAR LENS IMPLANT Left 2011   CATARACT EXTRACTION W/PHACO Right 05/26/2019   Procedure: CATARACT EXTRACTION PHACO AND INTRAOCULAR LENS PLACEMENT (IOC) RIGHT 10.64  01:10.2  15.1%;  Surgeon: Mittie Gaskin, MD;  Location: [facility];  Service: Ophthalmology;  Laterality: Right;   COLONOSCOPY  2016   COLONOSCOPY WITH PROPOFOL  N/A 07/22/2017   Procedure: COLONOSCOPY WITH PROPOFOL ;  Surgeon: Gaylyn Gladis PENNER, MD;  Location: [facility] ENDOSCOPY;  Service: Endoscopy;  Laterality: N/A;   COLONOSCOPY WITH PROPOFOL  N/A 07/21/2019   Procedure: COLONOSCOPY WITH PROPOFOL ;   Surgeon: Toledo, Ladell POUR, MD;  Location: [facility] ENDOSCOPY;  Service: Gastroenterology;  Laterality: N/A;   CYSTOSCOPY WITH INSERTION OF UROLIFT N/A 07/08/2022   Procedure: CYSTOSCOPY WITH INSERTION OF UROLIFT;  Surgeon: Penne Knee, MD;  Location: [facility] ORS;  Service: Urology;  Laterality: N/A;   DIAGNOSTIC LAPAROSCOPY     ELBOW FRACTURE SURGERY Left    screws and plate   ESOPHAGOGASTRODUODENOSCOPY N/A 07/21/2019   Procedure: ESOPHAGOGASTRODUODENOSCOPY (EGD);  Surgeon: Toledo, Ladell POUR, MD;  Location: [facility] ENDOSCOPY;  Service: Gastroenterology;  Laterality: N/A;   LAPAROSCOPIC BOWEL RESECTION     8 inches colon removed with polyps   RETINAL DETACHMENT SURGERY Left 10/24/2008   TONSILLECTOMY     WISDOM TOOTH EXTRACTION      Medical History: Past Medical History:  Diagnosis Date   Actinic keratosis    Anxiety    Benign non-nodular prostatic hyperplasia with lower urinary tract symptoms    Cancer of sigmoid colon (HCC) 06/20/2014   Depression 11/24/2013   Overview:  Questionable bipolar   Detached retina, left 2010   Diverticulosis    Fatty liver    on CT   Hemorrhoids    History of chicken pox    Hx of basal cell carcinoma 12/24/2016   Left anterior ear helix   Hx of basal cell carcinoma 06/24/2017   Left upper forehead   Hx of basal cell carcinoma 08/14/2017   Right upper forehead   Hypertension 11/24/2013   Personal history of colonic polyps 02/24/2014   Recurrent syncope     Family History: Family History  Problem Relation Age of Onset   CVA Mother    Diabetes Father    Prostate cancer Neg Hx    Bladder Cancer Neg Hx    Kidney cancer Neg Hx     Social History   Socioeconomic History   Marital status: Married    Spouse name: Rojelio   Number of children: 1   Years of education: Not on file   Highest education level: Not on file  Occupational History   Not on file  Tobacco Use   Smoking status: Never   Smokeless tobacco: Never  Vaping Use   Vaping  status: Never Used  Substance and Sexual Activity   Alcohol use: Yes    Alcohol/week: 8.0 standard drinks of alcohol    Types: 8 Cans of beer per week    Comment: 8 every night/4 maybe   Drug use: No   Sexual activity: Yes  Other Topics Concern   Not on file  Social History Narrative   Not on file   Social Drivers of Health   Financial Resource Strain: Low Risk  (11/06/2022)   Received from [facility]   Overall Financial Resource Strain (CARDIA)    Difficulty of Paying Living Expenses: Not very hard  Food Insecurity: No Food Insecurity (11/06/2022)   Received from [facility]   Hunger Vital Sign    Worried About Running Out of Food in the Last Year: Never true    Ran Out of Food in the Last Year: Never true  Transportation Needs: No Transportation Needs (11/06/2022)   Received from [facility] - Transportation    In the past 12 months, has lack of transportation kept you from medical appointments or from getting medications?: No    Lack of Transportation (Non-Medical): No  Physical Activity: Not on file  Stress: Not on file  Social Connections: Not on file  Intimate Partner Violence: Not on file      Review of Systems  Constitutional:  Positive for fatigue and unexpected weight change. Negative for chills.  HENT:  Positive for postnasal drip. Negative for congestion, rhinorrhea, sneezing and sore throat.   Eyes:  Negative for redness.  Respiratory:  Negative for cough, chest tightness, shortness of breath and wheezing.   Cardiovascular:  Negative for chest pain and palpitations.  Gastrointestinal:  Negative for abdominal pain, constipation, diarrhea, nausea and vomiting.  Endocrine: Positive for polydipsia and polyuria.  Genitourinary:  Negative for dysuria and frequency.  Musculoskeletal:  Negative for arthralgias, back pain, joint swelling and neck pain.  Skin:  Negative for rash.  Neurological: Negative.   Negative for tremors and numbness.  Hematological:  Negative for adenopathy. Does not bruise/bleed easily.  Psychiatric/Behavioral:  Positive for behavioral problems (Depression) and decreased concentration. Negative for self-injury, sleep disturbance and suicidal ideas. The patient is not nervous/anxious.     Vital Signs: BP 134/80   Pulse 63   Temp 98.1 F (36.7 C)   Resp 16   Ht 5' 6 (1.676 m)   Wt 211 lb 9.6 oz (96 kg)   SpO2 98%   BMI 34.15 kg/m    Physical Exam Vitals reviewed.  Constitutional:      General: He is not in acute distress.    Appearance: Normal appearance. He is obese. He is not ill-appearing.  HENT:     Head: Normocephalic and atraumatic.  Eyes:     Pupils: Pupils are equal, round, and reactive to light.  Cardiovascular:     Rate and Rhythm: Normal rate and regular rhythm.  Pulmonary:     Effort: Pulmonary effort is normal. No respiratory distress.  Neurological:     Mental Status: He is alert and oriented to person, place, and time.  Psychiatric:        Mood and Affect: Mood normal.        Behavior: Behavior normal.        Assessment/Plan: 1. 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 Unk  - Ambulatory referral to Gastroenterology  2. Type 2 diabetes mellitus with other specified complication, without long-term current use of insulin (HCC) Diet controlled for now. Last A1c is 6.5  3. Gastroenteritis due to norovirus Self-limiting, has bee improving and resolving.   4. Alcohol use disorder, severe, dependence (HCC) Has decreased number of nights per week that he is drinking to only once or twice a week. He plans to continue to gradually decrease his drinking further.    General Counseling: Carlin oakland understanding of the findings of todays visit and agrees with plan of treatment. I have discussed any further diagnostic evaluation that may be needed or ordered today. We also reviewed his  medications today. he has been encouraged to call the office with any questions or concerns that should arise related to todays visit.    Orders Placed This Encounter  Procedures   Ambulatory referral to Gastroenterology    No orders of the defined types were placed in this encounter.   Return in about 3 months (around 05/11/2023) for F/U, Joyanna Kleman PCP.   Total time spent:30 Minutes Time spent includes review of chart, medications, test results, and follow up plan with the patient.   [place] Controlled Substance Database was reviewed by me.  This patient was seen by Mardy Maxin, FNP-C in collaboration with Dr. Sigrid Bathe as a part of collaborative care agreement.   Amad Mau R. Maxin, MSN, FNP-C Internal medicine

## 2023-02-21 DIAGNOSIS — J209 Acute bronchitis, unspecified: Secondary | ICD-10-CM | POA: Diagnosis not present

## 2023-02-21 DIAGNOSIS — B9689 Other specified bacterial agents as the cause of diseases classified elsewhere: Secondary | ICD-10-CM | POA: Diagnosis not present

## 2023-02-21 DIAGNOSIS — J019 Acute sinusitis, unspecified: Secondary | ICD-10-CM | POA: Diagnosis not present

## 2023-02-21 DIAGNOSIS — J029 Acute pharyngitis, unspecified: Secondary | ICD-10-CM | POA: Diagnosis not present

## 2023-03-02 ENCOUNTER — Encounter: Payer: Self-pay | Admitting: Nurse Practitioner

## 2023-03-04 ENCOUNTER — Encounter: Payer: Self-pay | Admitting: Nurse Practitioner

## 2023-03-06 DIAGNOSIS — H401131 Primary open-angle glaucoma, bilateral, mild stage: Secondary | ICD-10-CM | POA: Diagnosis not present

## 2023-03-13 DIAGNOSIS — H3320 Serous retinal detachment, unspecified eye: Secondary | ICD-10-CM | POA: Diagnosis not present

## 2023-03-13 DIAGNOSIS — Z961 Presence of intraocular lens: Secondary | ICD-10-CM | POA: Diagnosis not present

## 2023-03-13 DIAGNOSIS — H401131 Primary open-angle glaucoma, bilateral, mild stage: Secondary | ICD-10-CM | POA: Diagnosis not present

## 2023-04-07 ENCOUNTER — Other Ambulatory Visit: Payer: Self-pay | Admitting: Internal Medicine

## 2023-04-07 NOTE — Telephone Encounter (Signed)
 Please review and send

## 2023-04-28 DIAGNOSIS — J0191 Acute recurrent sinusitis, unspecified: Secondary | ICD-10-CM | POA: Diagnosis not present

## 2023-05-09 ENCOUNTER — Other Ambulatory Visit: Payer: Self-pay | Admitting: Urology

## 2023-05-09 ENCOUNTER — Other Ambulatory Visit: Payer: Self-pay | Admitting: Nurse Practitioner

## 2023-05-09 DIAGNOSIS — N401 Enlarged prostate with lower urinary tract symptoms: Secondary | ICD-10-CM

## 2023-05-12 ENCOUNTER — Ambulatory Visit (INDEPENDENT_AMBULATORY_CARE_PROVIDER_SITE_OTHER): Payer: Medicare Other | Admitting: Nurse Practitioner

## 2023-05-12 ENCOUNTER — Telehealth: Payer: Self-pay | Admitting: Nurse Practitioner

## 2023-05-12 ENCOUNTER — Encounter: Payer: Self-pay | Admitting: Nurse Practitioner

## 2023-05-12 VITALS — BP 130/74 | HR 74 | Temp 98.5°F | Resp 16 | Ht 66.0 in | Wt 213.2 lb

## 2023-05-12 DIAGNOSIS — K702 Alcoholic fibrosis and sclerosis of liver: Secondary | ICD-10-CM | POA: Diagnosis not present

## 2023-05-12 DIAGNOSIS — F1020 Alcohol dependence, uncomplicated: Secondary | ICD-10-CM | POA: Insufficient documentation

## 2023-05-12 DIAGNOSIS — F331 Major depressive disorder, recurrent, moderate: Secondary | ICD-10-CM | POA: Diagnosis not present

## 2023-05-12 DIAGNOSIS — E1169 Type 2 diabetes mellitus with other specified complication: Secondary | ICD-10-CM

## 2023-05-12 DIAGNOSIS — I152 Hypertension secondary to endocrine disorders: Secondary | ICD-10-CM

## 2023-05-12 DIAGNOSIS — E1159 Type 2 diabetes mellitus with other circulatory complications: Secondary | ICD-10-CM | POA: Diagnosis not present

## 2023-05-12 DIAGNOSIS — E785 Hyperlipidemia, unspecified: Secondary | ICD-10-CM

## 2023-05-12 LAB — POCT GLYCOSYLATED HEMOGLOBIN (HGB A1C): Hemoglobin A1C: 6 % — AB (ref 4.0–5.6)

## 2023-05-12 MED ORDER — VENLAFAXINE HCL ER 150 MG PO CP24: 150.0000 mg | ORAL_CAPSULE | Freq: Every day | ORAL | 3 refills | Status: AC

## 2023-05-12 MED ORDER — LOSARTAN POTASSIUM-HCTZ 50-12.5 MG PO TABS: 1.0000 | ORAL_TABLET | Freq: Every day | ORAL | 3 refills | Status: AC

## 2023-05-12 NOTE — Telephone Encounter (Signed)
 Awaiting 05/12/23 office notes for Psychology referral-Toni

## 2023-05-12 NOTE — Progress Notes (Signed)
 [facility] [address] [place], [place] [address]  Internal MEDICINE  Office Visit Note  Patient Name: Matthew Webster  [id number]  [id number]  Date of Service: 05/12/2023  Chief Complaint  Patient presents with   Depression   Hypertension   Follow-up    HPI Matthew Webster presents for a follow-up visit for alcohol use, depression, and diabetes  Has increased his drinking again -- back to 100 oz of fosters beer per night. Wants to see a therapist for this  Working out on his peloton -- every other day or every 3rd day.  Interested in faith-based therapy/counseling -- for depression and addiction.  Type 2 diabetes -- last A1c was in October last year and his A1c was 6.5. due today A1c is 6.0 today which is improved.      Current Medication: Outpatient Encounter Medications as of 05/12/2023  Medication Sig   latanoprost  (XALATAN ) 0.005 % ophthalmic solution Place 1 drop into both eyes at bedtime.   sildenafil (REVATIO) 20 MG tablet TAKE 1 TAB AS NEEDED.   tamsulosin  (FLOMAX ) 0.4 MG CAPS capsule TAKE 1 CAPSULE(0.4 MG) BY MOUTH DAILY 30 MINUTES AFTER THE SAME MEAL   timolol  (TIMOPTIC ) 0.5 % ophthalmic solution instill 1 drop into left eye every morning   [DISCONTINUED] losartan -hydrochlorothiazide  (HYZAAR) 50-12.5 MG tablet Take 1 tablet by mouth daily.   [DISCONTINUED] venlafaxine  XR (EFFEXOR -XR) 150 MG 24 hr capsule Take 150 mg by mouth at bedtime.   losartan -hydrochlorothiazide  (HYZAAR) 50-12.5 MG tablet Take 1 tablet by mouth daily.   venlafaxine  XR (EFFEXOR -XR) 150 MG 24 hr capsule Take 1 capsule (150 mg total) by mouth at bedtime.   No facility-administered encounter medications on file as of 05/12/2023.    Surgical History: Past Surgical History:  Procedure Laterality Date   CATARACT EXTRACTION W/ INTRAOCULAR LENS IMPLANT Left 2011   CATARACT EXTRACTION W/PHACO Right 05/26/2019   Procedure: CATARACT EXTRACTION PHACO AND INTRAOCULAR LENS PLACEMENT (IOC) RIGHT  10.64  01:10.2  15.1%;  Surgeon: Annell Kidney, MD;  [facility];  Service: Ophthalmology;  Laterality: Right;   COLONOSCOPY  2016   COLONOSCOPY WITH PROPOFOL  N/A 07/22/2017   Procedure: COLONOSCOPY WITH PROPOFOL ;  Surgeon: Deveron Fly, MD;  Location: [facility];  Service: Endoscopy;  Laterality: N/A;   COLONOSCOPY WITH PROPOFOL  N/A 07/21/2019   Procedure: COLONOSCOPY WITH PROPOFOL ;  Surgeon: Toledo, Alphonsus Jeans, MD;  Location: [facility] ENDOSCOPY;  Service: Gastroenterology;  Laterality: N/A;   CYSTOSCOPY WITH INSERTION OF UROLIFT N/A 07/08/2022   Procedure: CYSTOSCOPY WITH INSERTION OF UROLIFT;  Surgeon: Dustin Gimenez, MD;  Location: [facility];  Service: Urology;  Laterality: N/A;   DIAGNOSTIC LAPAROSCOPY     ELBOW FRACTURE SURGERY Left    screws and plate   ESOPHAGOGASTRODUODENOSCOPY N/A 07/21/2019   Procedure: ESOPHAGOGASTRODUODENOSCOPY (EGD);  Surgeon: Toledo, Alphonsus Jeans, MD;  Location: [facility];  Service: Gastroenterology;  Laterality: N/A;   LAPAROSCOPIC BOWEL RESECTION     8 inches colon removed with polyps   RETINAL DETACHMENT SURGERY Left 10/24/2008   TONSILLECTOMY     WISDOM TOOTH EXTRACTION      Medical History: Past Medical History:  Diagnosis Date   Actinic keratosis    Anxiety    Benign non-nodular prostatic hyperplasia with lower urinary tract symptoms    Cancer of sigmoid colon (HCC) 06/20/2014   Depression 11/24/2013   Overview:  Questionable bipolar   Detached retina, left 2010   Diverticulosis    Fatty liver    on CT  Hemorrhoids    History of chicken pox    Hx of basal cell carcinoma 12/24/2016   Left anterior ear helix   Hx of basal cell carcinoma 06/24/2017   Left upper forehead   Hx of basal cell carcinoma 08/14/2017   Right upper forehead   Hypertension 11/24/2013   Personal history of colonic polyps 02/24/2014   Recurrent syncope     Family History: Family History  Problem Relation Age of Onset   CVA Mother     Diabetes Father    Prostate cancer Neg Hx    Bladder Cancer Neg Hx    Kidney cancer Neg Hx     Social History   Socioeconomic History   Marital status: Married    Spouse name: Matthew Webster   Number of children: 1   Years of education: Not on file   Highest education level: Not on file  Occupational History   Not on file  Tobacco Use   Smoking status: Never   Smokeless tobacco: Never  Vaping Use   Vaping status: Never Used  Substance and Sexual Activity   Alcohol use: Yes    Alcohol/week: 8.0 standard drinks of alcohol    Types: 8 Cans of beer per week    Comment: 8 every night/4 maybe   Drug use: No   Sexual activity: Yes  Other Topics Concern   Not on file  Social History Narrative   Not on file   Social Drivers of Health   Financial Resource Strain: Low Risk  (11/06/2022)   Received [facility]   Overall Financial Resource Strain (CARDIA)    Difficulty of Paying Living Expenses: Not very hard  Food Insecurity: No Food Insecurity (11/06/2022)   Received from [facility]   Hunger Vital Sign    Worried About Running Out of Food in the Last Year: Never true    Ran Out of Food in the Last Year: Never true  Transportation Needs: No Transportation Needs (11/06/2022)   Received from [facility] - Transportation    In the past 12 months, has lack of transportation kept you from medical appointments or from getting medications?: No    Lack of Transportation (Non-Medical): No  Physical Activity: Not on file  Stress: Not on file  Social Connections: Not on file  Intimate Partner Violence: Not on file      Review of Systems  Constitutional:  Positive for fatigue and unexpected weight change. Negative for chills.  HENT:  Positive for postnasal drip. Negative for congestion, rhinorrhea, sneezing and sore throat.   Eyes:  Negative for redness.  Respiratory:  Negative for cough, chest tightness, shortness of  breath and wheezing.   Cardiovascular:  Negative for chest pain and palpitations.  Gastrointestinal:  Negative for abdominal pain, constipation, diarrhea, nausea and vomiting.  Endocrine: Negative for polydipsia and polyuria.  Genitourinary:  Negative for dysuria and frequency.  Musculoskeletal:  Negative for arthralgias, back pain, joint swelling and neck pain.  Skin:  Negative for rash.  Neurological: Negative.  Negative for tremors and numbness.  Hematological:  Negative for adenopathy. Does not bruise/bleed easily.  Psychiatric/Behavioral:  Positive for behavioral problems (Depression), decreased concentration and sleep disturbance. Negative for self-injury and suicidal ideas. The patient is not nervous/anxious.     Vital Signs: BP 130/74   Pulse 74   Temp 98.5 F (36.9 C)   Resp 16   Ht 5\' 6"  (1.676 m)   Wt  213 lb 3.2 oz (96.7 kg)   SpO2 95%   BMI 34.41 kg/m    Physical Exam Vitals reviewed.  Constitutional:      General: He is not in acute distress.    Appearance: Normal appearance. He is obese. He is not ill-appearing.  HENT:     Head: Normocephalic and atraumatic.  Eyes:     Pupils: Pupils are equal, round, and reactive to light.  Cardiovascular:     Rate and Rhythm: Normal rate and regular rhythm.  Pulmonary:     Effort: Pulmonary effort is normal. No respiratory distress.  Neurological:     Mental Status: He is alert and oriented to person, place, and time.  Psychiatric:        Mood and Affect: Mood normal.        Behavior: Behavior normal.        Assessment/Plan: 1. Type 2 diabetes mellitus with other specified complication, without long-term current use of insulin (HCC) (Primary) A1c is 6.0 which is stable. Routine labs ordered. Continue to maintain glucose control with diet alone.  - POCT glycosylated hemoglobin (Hb A1C) - CBC with Differential/Platelet - CMP14+EGFR - Lipid Profile  2. Hypertension associated with type 2 diabetes mellitus  (HCC) Stable, continue losartan -hydrochlorothiazide  as prescribed. Routine labs ordered  - losartan -hydrochlorothiazide  (HYZAAR) 50-12.5 MG tablet; Take 1 tablet by mouth daily.  Dispense: 90 tablet; Refill: 3 - CBC with Differential/Platelet - CMP14+EGFR - Lipid Profile  3. Hyperlipidemia associated with type 2 diabetes mellitus (HCC) Routine labs ordered  - CBC with Differential/Platelet - CMP14+EGFR - Lipid Profile  4. 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 Moderate episode of recurrent major depressive disorder (HCC) Referred for therapy  - venlafaxine  XR (EFFEXOR -XR) 150 MG 24 hr capsule; Take 1 capsule (150 mg total) by mouth at bedtime.  Dispense: 90 capsule; Refill: 3 - Ambulatory referral to Psychology  6. Alcohol use disorder, severe, dependence (HCC) Referred to therapy  - Ambulatory referral to Psychology   General Counseling: Orlando Bison understanding of the findings of todays visit and agrees with plan of treatment. I have discussed any further diagnostic evaluation that may be needed or ordered today. We also reviewed his medications today. he has been encouraged to call the office with any questions or concerns that should arise related to todays visit.    Orders Placed This Encounter  Procedures   CBC with Differential/Platelet   CMP14+EGFR   Lipid Profile   Ambulatory referral to Psychology   POCT glycosylated hemoglobin (Hb A1C)    Meds ordered this encounter  Medications   losartan -hydrochlorothiazide  (HYZAAR) 50-12.5 MG tablet    Sig: Take 1 tablet by mouth daily.    Dispense:  90 tablet    Refill:  3    For future refills, keep on file, discontinue all prior orders from other providers as I am taking over management of this medication.   venlafaxine  XR (EFFEXOR -XR) 150 MG 24 hr capsule    Sig: Take 1 capsule (150 mg total) by mouth at bedtime.    Dispense:   90 capsule    Refill:  3    For future refills, keep on file, discontinue all prior orders from other provider as I am taking over management of this medication.    Return for AWV, Lakeva Hollon PCP in the next 1-2 months, have labs done prior to office visit. .   Total time spent:30  Minutes Time spent includes review of chart, medications, test results, and follow up plan with the patient.   [place] Controlled Substance Database was reviewed by me.  This patient was seen by Laurence Pons, FNP-C in collaboration with Dr. Verneta Gone as a part of collaborative care agreement.   Naithan Delage R. Bobbi Burow, MSN, FNP-C Internal medicine

## 2023-05-14 ENCOUNTER — Telehealth: Payer: Self-pay | Admitting: Nurse Practitioner

## 2023-05-14 DIAGNOSIS — E1169 Type 2 diabetes mellitus with other specified complication: Secondary | ICD-10-CM | POA: Insufficient documentation

## 2023-05-14 NOTE — Telephone Encounter (Signed)
 Psychology referral faxed to [facility]; [phone]. Lvm notifying patient. Gave pt telephone [phone]

## 2023-05-27 ENCOUNTER — Ambulatory Visit: Payer: Medicare Other | Admitting: Dermatology

## 2023-05-27 ENCOUNTER — Encounter: Payer: Self-pay | Admitting: Dermatology

## 2023-05-27 DIAGNOSIS — L570 Actinic keratosis: Secondary | ICD-10-CM

## 2023-05-27 DIAGNOSIS — Z85828 Personal history of other malignant neoplasm of skin: Secondary | ICD-10-CM

## 2023-05-27 DIAGNOSIS — W908XXA Exposure to other nonionizing radiation, initial encounter: Secondary | ICD-10-CM

## 2023-05-27 DIAGNOSIS — D229 Melanocytic nevi, unspecified: Secondary | ICD-10-CM

## 2023-05-27 DIAGNOSIS — L814 Other melanin hyperpigmentation: Secondary | ICD-10-CM

## 2023-05-27 DIAGNOSIS — L821 Other seborrheic keratosis: Secondary | ICD-10-CM

## 2023-05-27 DIAGNOSIS — D1801 Hemangioma of skin and subcutaneous tissue: Secondary | ICD-10-CM

## 2023-05-27 DIAGNOSIS — Z1283 Encounter for screening for malignant neoplasm of skin: Secondary | ICD-10-CM

## 2023-05-27 DIAGNOSIS — Z79899 Other long term (current) drug therapy: Secondary | ICD-10-CM

## 2023-05-27 DIAGNOSIS — L738 Other specified follicular disorders: Secondary | ICD-10-CM

## 2023-05-27 DIAGNOSIS — L578 Other skin changes due to chronic exposure to nonionizing radiation: Secondary | ICD-10-CM

## 2023-05-27 DIAGNOSIS — Z7189 Other specified counseling: Secondary | ICD-10-CM

## 2023-05-27 DIAGNOSIS — L739 Follicular disorder, unspecified: Secondary | ICD-10-CM

## 2023-05-27 DIAGNOSIS — D2272 Melanocytic nevi of left lower limb, including hip: Secondary | ICD-10-CM

## 2023-05-27 NOTE — Progress Notes (Signed)
 Follow-Up Visit   Subjective  Matthew Webster is a 73 y.o. male who presents for the following: Skin Cancer Screening and Upper Body Skin Exam. C/O rough scaly areas at sideburns, ears, temples. Raised scabbed areas at back. Patient states he picks at these areas because the feel like scabs.   Hx of BCCs, Hx of AKs.   The patient presents for Upper Body Skin Exam (UBSE) for skin cancer screening and mole check. The patient has spots, moles and lesions to be evaluated, some may be new or changing and the patient may have concern these could be cancer.    The following portions of the chart were reviewed this encounter and updated as appropriate: medications, allergies, medical history  Review of Systems:  No other skin or systemic complaints except as noted in HPI or Assessment and Plan.  Objective  Well appearing patient in no apparent distress; mood and affect are within normal limits.  All skin waist up examined. Relevant physical exam findings are noted in the Assessment and Plan.  Vertex Scalp x6, R temple x2, R lateral cheek x1, R upper eyebrow x1 (8) Erythematous thin papules/macules with gritty scale.  Back clear today  Assessment & Plan   AK (ACTINIC KERATOSIS) (8) Vertex Scalp x6, R temple x2, R lateral cheek x1, R upper eyebrow x1 (8) Actinic keratoses are precancerous spots that appear secondary to cumulative UV radiation exposure/sun exposure over time. They are chronic with expected duration over 1 year. A portion of actinic keratoses will progress to squamous cell carcinoma of the skin. It is not possible to reliably predict which spots will progress to skin cancer and so treatment is recommended to prevent development of skin cancer.  Recommend daily broad spectrum sunscreen SPF 30+ to sun-exposed areas, reapply every 2 hours as needed.  Recommend staying in the shade or wearing long sleeves, sun glasses (UVA+UVB protection) and wide brim hats (4-inch brim around the  entire circumference of the hat). Call for new or changing lesions. Destruction of lesion - Vertex Scalp x6, R temple x2, R lateral cheek x1, R upper eyebrow x1 (8)  Destruction method: cryotherapy   Informed consent: discussed and consent obtained   Lesion destroyed using liquid nitrogen: Yes   Region frozen until ice ball extended beyond lesion: Yes   Outcome: patient tolerated procedure well with no complications   Post-procedure details: wound care instructions given   Additional details:  Prior to procedure, discussed risks of blister formation, small wound, skin dyspigmentation, or rare scar following cryotherapy. Recommend Vaseline ointment to treated areas while healing.   Skin cancer screening performed today.  HISTORY OF BASAL CELL CARCINOMA OF THE SKIN - No evidence of recurrence today - Recommend regular full body skin exams - Recommend daily broad spectrum sunscreen SPF 30+ to sun-exposed areas, reapply every 2 hours as needed.  - Call if any new or changing lesions are noted between office visits   ACTINIC DAMAGE WITH PRECANCEROUS ACTINIC KERATOSES Counseling for Topical Chemotherapy Management: Patient exhibits: - Severe, confluent actinic changes with pre-cancerous actinic keratoses that is secondary to cumulative UV radiation exposure over time - Condition that is severe; chronic, not at goal. - diffuse scaly erythematous macules and papules with underlying dyspigmentation - Discussed Prescription "Field Treatment" topical Chemotherapy for Severe, Chronic Confluent Actinic Changes with Pre-Cancerous Actinic Keratoses Field treatment involves treatment of an entire area of skin that has confluent Actinic Changes (Sun/ Ultraviolet light damage) and PreCancerous Actinic Keratoses by method of  PhotoDynamic Therapy (PDT) and/or prescription Topical Chemotherapy agents such as 5-fluorouracil , 5-fluorouracil /calcipotriene , and/or imiquimod.  The purpose is to decrease the number  of clinically evident and subclinical PreCancerous lesions to prevent progression to development of skin cancer by chemically destroying early precancer changes that may or may not be visible.  It has been shown to reduce the risk of developing skin cancer in the treated area. As a result of treatment, redness, scaling, crusting, and open sores may occur during treatment course. One or more than one of these methods may be used and may have to be used several times to control, suppress and eliminate the PreCancerous changes. Discussed treatment course, expected reaction, and possible side effects. - Recommend daily broad spectrum sunscreen SPF 30+ to sun-exposed areas, reapply every 2 hours as needed.  - Staying in the shade or wearing long sleeves, sun glasses (UVA+UVB protection) and wide brim hats (4-inch brim around the entire circumference of the hat) are also recommended. - Call for new or changing lesions.  Begin once areas treated today have healed - Start 5-fluorouracil /calcipotriene  cream twice a day for 5 days to affected areas including temples, apply twice a day for 7-10 days to scalp.   Reviewed course of treatment and expected reaction.  Patient advised to expect inflammation and crusting and advised that erosions are possible.  Patient advised to be diligent with sun protection during and after treatment. Counseled to keep medication out of reach of children and pets.    Sebaceous Hyperplasia - Small yellow papules with a central dell at face - Benign-appearing - Observe. Call for changes.   Lentigines, Seborrheic Keratoses, Hemangiomas - Benign normal skin lesions - Benign-appearing - Call for any changes  Melanocytic Nevi - Tan-brown and/or pink-flesh-colored symmetric macules and papules - 5 mm indistinct flesh-tan papule at left preauricular  - Benign appearing on exam today - Observation - Call clinic for new or changing moles - Recommend daily use of broad spectrum spf  30+ sunscreen to sun-exposed areas.   FOLLICULITIS Exam: Perifollicular erythematous papules and pustules at scalp  Folliculitis occurs due to inflammation of the superficial hair follicle (pore), resulting in acne-like lesions (pus bumps). It can be infectious (bacterial, fungal) or noninfectious (shaving, tight clothing, heat/sweat, medications).  Folliculitis can be acute or chronic and recommended treatment depends on the underlying cause of folliculitis.  Treatment Plan: Recommend OTC Head & Shoulders shampoo 2-3x per week, massage into scalp and let sit 3-5 minutes before rinsing.     Return in about 6 months (around 11/26/2023) for UBSE, AK Follow Up, HxBCC.  I, Jill Parcell, CMA, am acting as scribe for Artemio Larry, MD.   Documentation: I have reviewed the above documentation for accuracy and completeness, and I agree with the above.  Artemio Larry, MD

## 2023-05-27 NOTE — Patient Instructions (Addendum)
 Cryotherapy Aftercare  Wash gently with soap and water everyday.   Apply Vaseline Jelly daily until healed.  Recommend OTC Head & Shoulders shampoo 2-3x per week, massage into scalp and let sit 3-5 minutes before rinsing.   Begin once areas treated today have healed: - Start 5-fluorouracil /calcipotriene  cream twice a day for 5 days to affected areas including temples, apply twice a day for 7-10 days to scalp.  Mix equal parts of the cream in your hand and apply to affected areas or apply once cream over the other in any order.   Reviewed course of treatment and expected reaction.  Patient advised to expect inflammation and crusting and advised that erosions are possible.  Patient advised to be diligent with sun protection during and after treatment. Counseled to keep medication out of reach of children and pets.   5-Fluorouracil /Calcipotriene  Patient Education   Actinic keratoses are the dry, red scaly spots on the skin caused by sun damage. A portion of these spots can turn into skin cancer with time, and treating them can help prevent development of skin cancer.   Treatment of these spots requires removal of the defective skin cells. There are various ways to remove actinic keratoses, including freezing with liquid nitrogen, treatment with creams, or treatment with a blue light procedure in the office.   5-fluorouracil  cream is a topical cream used to treat actinic keratoses. It works by interfering with the growth of abnormal fast-growing skin cells, such as actinic keratoses. These cells peel off and are replaced by healthy ones. THIS CREAM SHOULD BE KEPT OUT OF REACH OF CHILDREN AND PETS AND SHOULD NOT BE USED BY PREGNANT WOMEN.  5-fluorouracil /calcipotriene  is a combination of the 5-fluorouracil  cream with a vitamin D analog cream called calcipotriene . The calcipotriene  alone does not treat actinic keratoses. However, when it is combined with 5-fluorouracil , it helps the 5-fluorouracil   treat the actinic keratoses much faster so that the same results can be achieved with a much shorter treatment time.  INSTRUCTIONS FOR 5-FLUOROURACIL /CALCIPOTRIENE  CREAM:   5-fluorouracil /calcipotriene  cream typically only needs to be used for 4-7 days. A thin layer should be applied twice a day to the treatment areas recommended by your physician.   If your physician prescribed you separate tubes of 5-fluourouracil and calcipotriene , apply a thin layer of 5-fluorouracil  followed by a thin layer of calcipotriene .   Avoid contact with your eyes or nostrils. Avoid applying the cream to your eyelids or lips unless directed to apply there by your physician. Do not use 5-fluorouracil /calcipotriene  cream on infected or open wounds.   You will develop redness, irritation and some crusting at areas where you have pre-cancer damage/actinic keratoses. IF YOU DEVELOP PAIN, BLEEDING, OR SIGNIFICANT CRUSTING, STOP THE TREATMENT EARLY - you have already gotten a good response and the actinic keratoses should clear up well.  Wash your hands after applying 5-fluorouracil  5% cream on your skin.   A moisturizer or sunscreen with a minimum SPF 30 should be applied each morning.   Once you have finished the treatment, you can apply a thin layer of Vaseline twice a day to irritated areas to soothe and calm the areas more quickly. If you experience significant discomfort, contact your physician.  For some patients it is necessary to repeat the treatment for best results.  SIDE EFFECTS: When using 5-fluorouracil /calcipotriene  cream, you may have mild irritation, such as redness, dryness, swelling, or a mild burning sensation. This usually resolves within 2 weeks. The more actinic keratoses you have, the  more redness and inflammation you can expect during treatment. Eye irritation has been reported rarely. If this occurs, please let us  know.   If you have any trouble using this cream, please send us  a MyChart message  or call the office. If you have any other questions about this information, please do not hesitate to ask me before you leave the office or contact me on MyChart or by phone.       Recommend daily broad spectrum sunscreen SPF 30+ to sun-exposed areas, reapply every 2 hours as needed. Call for new or changing lesions.  Staying in the shade or wearing long sleeves, sun glasses (UVA+UVB protection) and wide brim hats (4-inch brim around the entire circumference of the hat) are also recommended for sun protection.      Melanoma ABCDEs  Melanoma is the most dangerous type of skin cancer, and is the leading cause of death from skin disease.  You are more likely to develop melanoma if you: Have light-colored skin, light-colored eyes, or red or blond hair Spend a lot of time in the sun Tan regularly, either outdoors or in a tanning bed Have had blistering sunburns, especially during childhood Have a close family member who has had a melanoma Have atypical moles or large birthmarks  Early detection of melanoma is key since treatment is typically straightforward and cure rates are extremely high if we catch it early.   The first sign of melanoma is often a change in a mole or a new dark spot.  The ABCDE system is a way of remembering the signs of melanoma.  A for asymmetry:  The two halves do not match. B for border:  The edges of the growth are irregular. C for color:  A mixture of colors are present instead of an even brown color. D for diameter:  Melanomas are usually (but not always) greater than 6mm - the size of a pencil eraser. E for evolution:  The spot keeps changing in size, shape, and color.  Please check your skin once per month between visits. You can use a small mirror in front and a large mirror behind you to keep an eye on the back side or your body.   If you see any new or changing lesions before your next follow-up, please call to schedule a visit.  Please continue daily  skin protection including broad spectrum sunscreen SPF 30+ to sun-exposed areas, reapplying every 2 hours as needed when you're outdoors.   Staying in the shade or wearing long sleeves, sun glasses (UVA+UVB protection) and wide brim hats (4-inch brim around the entire circumference of the hat) are also recommended for sun protection.      Due to recent changes in healthcare laws, you may see results of your pathology and/or laboratory studies on MyChart before the doctors have had a chance to review them. We understand that in some cases there may be results that are confusing or concerning to you. Please understand that not all results are received at the same time and often the doctors may need to interpret multiple results in order to provide you with the best plan of care or course of treatment. Therefore, we ask that you please give us  2 business days to thoroughly review all your results before contacting the office for clarification. Should we see a critical lab result, you will be contacted sooner.   If You Need Anything After Your Visit  If you have any questions or concerns for your  doctor, please call our main line at [phone] and press option 4 to reach your doctor's medical assistant. If no one answers, please leave a voicemail as directed and we will return your call as soon as possible. Messages left after 4 pm will be answered the following business day.   You may also send us  a message via MyChart. We typically respond to MyChart messages within 1-2 business days.  For prescription refills, please ask your pharmacy to contact our office. Our fax number is [phone].  If you have an urgent issue when the clinic is closed that cannot wait until the next business day, you can page your doctor at the number below.    Please note that while we do our best to be available for urgent issues outside of office hours, we are not available 24/7.   If you have an urgent issue and are  unable to reach us , you may choose to seek medical care at your doctor's office, retail clinic, urgent care center, or emergency room.  If you have a medical emergency, please immediately call 911 or go to the emergency department.  Pager Numbers  - Dr. Bary Likes: [phone]  - Dr. Annette Barters: [phone]  - Dr. Felipe Horton: [phone]   In the event of inclement weather, please call our main line at [phone] for an update on the status of any delays or closures.  Dermatology Medication Tips: Please keep the boxes that topical medications come in in order to help keep track of the instructions about where and how to use these. Pharmacies typically print the medication instructions only on the boxes and not directly on the medication tubes.   If your medication is too expensive, please contact our office at [phone] option 4 or send us  a message through MyChart.   We are unable to tell what your co-pay for medications will be in advance as this is different depending on your insurance coverage. However, we may be able to find a substitute medication at lower cost or fill out paperwork to get insurance to cover a needed medication.   If a prior authorization is required to get your medication covered by your insurance company, please allow us  1-2 business days to complete this process.  Drug prices often vary depending on where the prescription is filled and some pharmacies may offer cheaper prices.  The website www.goodrx.com contains coupons for medications through different pharmacies. The prices here do not account for what the cost may be with help from insurance (it may be cheaper with your insurance), but the website can give you the price if you did not use any insurance.  - You can print the associated coupon and take it with your prescription to the pharmacy.  - You may also stop by our office during regular business hours and pick up a GoodRx coupon card.  - If you need your  prescription sent electronically to a different pharmacy, notify our office through [facility] or by phone at [phone] option 4.     Si Usted Necesita Algo Despus de Su Visita  Tambin puede enviarnos un mensaje a travs de Clinical cytogeneticist. Por lo general respondemos a los mensajes de MyChart en el transcurso de 1 a 2 das hbiles.  Para renovar recetas, por favor pida a su farmacia que se ponga en contacto con nuestra oficina. Franz Jacks de fax es [place] [phone].  Si tiene un asunto urgente cuando la clnica est cerrada y que no puede esperar Freight forwarder  siguiente da hbil, puede llamar/localizar a su doctor(a) al nmero que aparece a continuacin.   Por favor, tenga en cuenta que aunque hacemos todo lo posible para estar disponibles para asuntos urgentes fuera del horario de [place], no estamos disponibles las 24 horas del da, los 7 [address]  [address] de la [place].   Si tiene un problema urgente y no puede comunicarse con nosotros, puede optar por buscar atencin mdica  en el consultorio de su doctor(a), en una clnica privada, en un centro de atencin urgente o en una sala de emergencias.  Si tiene Engineer, drilling, por favor llame inmediatamente al 911 o vaya a la sala de emergencias.  Nmeros de bper  - Dr. Bary Likes: [phone]  - Dra. Annette Barters: [phone]  - Dr. Felipe Horton: [phone]   En caso de inclemencias del tiempo, por favor llame a Lajuan Pila principal al [phone] para una actualizacin sobre el [place] de cualquier retraso o cierre.  Consejos para la medicacin en dermatologa: Por favor, guarde las cajas en las que vienen los medicamentos de uso tpico para ayudarle a seguir las instrucciones sobre dnde y cmo usarlos. Las farmacias generalmente imprimen las instrucciones del medicamento slo en las cajas y no directamente en los tubos del [place].   Si su medicamento es muy caro, por favor, pngase en contacto con Bettyjane Brunet llamando al  [phone] y presione la opcin 4 o envenos un mensaje a travs de Clinical cytogeneticist.   No podemos decirle cul ser su copago por los medicamentos por adelantado ya que esto es diferente dependiendo de la cobertura de su seguro. Sin embargo, es posible que podamos encontrar un medicamento sustituto a Audiological scientist un formulario para que el seguro cubra el medicamento que se considera necesario.   Si se requiere una autorizacin previa para que su compaa de seguros [place] su medicamento, por favor permtanos de 1 a 2 das hbiles para completar este proceso.  Los precios de los medicamentos varan con frecuencia dependiendo del Environmental consultant de dnde se surte la receta y alguna farmacias pueden ofrecer precios ms baratos.  El sitio web www.goodrx.com tiene cupones para medicamentos de Health and safety inspector. Los precios aqu no tienen en cuenta lo que podra costar con la ayuda del seguro (puede ser ms barato con su seguro), pero el sitio web puede darle el precio si no utiliz Tourist information centre manager.  - Puede imprimir el cupn correspondiente y llevarlo con su receta a la farmacia.  - Tambin puede pasar por nuestra oficina durante el horario de atencin regular y Education officer, museum una tarjeta de cupones de GoodRx.  - Si necesita que su receta se enve electrnicamente a una farmacia diferente, informe a nuestra oficina a travs de MyChart de [place] o por telfono llamando al [phone] y presione la opcin 4.

## 2023-06-05 ENCOUNTER — Telehealth: Payer: Self-pay | Admitting: Nurse Practitioner

## 2023-06-05 NOTE — Telephone Encounter (Signed)
 Lvm to return my call regarding GI referral.

## 2023-06-24 DIAGNOSIS — K080 Exfoliation of teeth due to systemic causes: Secondary | ICD-10-CM | POA: Diagnosis not present

## 2023-07-03 ENCOUNTER — Telehealth: Payer: Self-pay | Admitting: Nurse Practitioner

## 2023-07-03 NOTE — Telephone Encounter (Signed)
 Left vm to confirm 07/07/23 appointment-Toni

## 2023-07-07 ENCOUNTER — Encounter: Payer: Self-pay | Admitting: Nurse Practitioner

## 2023-07-07 ENCOUNTER — Ambulatory Visit: Admitting: Nurse Practitioner

## 2023-07-07 VITALS — BP 130/80 | HR 76 | Temp 98.3°F | Resp 16 | Ht 66.0 in | Wt 213.4 lb

## 2023-07-07 DIAGNOSIS — M21612 Bunion of left foot: Secondary | ICD-10-CM

## 2023-07-07 DIAGNOSIS — E1169 Type 2 diabetes mellitus with other specified complication: Secondary | ICD-10-CM

## 2023-07-07 DIAGNOSIS — E1159 Type 2 diabetes mellitus with other circulatory complications: Secondary | ICD-10-CM

## 2023-07-07 DIAGNOSIS — Z0000 Encounter for general adult medical examination without abnormal findings: Secondary | ICD-10-CM

## 2023-07-07 DIAGNOSIS — F331 Major depressive disorder, recurrent, moderate: Secondary | ICD-10-CM

## 2023-07-07 DIAGNOSIS — E785 Hyperlipidemia, unspecified: Secondary | ICD-10-CM

## 2023-07-07 DIAGNOSIS — N401 Enlarged prostate with lower urinary tract symptoms: Secondary | ICD-10-CM

## 2023-07-07 DIAGNOSIS — I152 Hypertension secondary to endocrine disorders: Secondary | ICD-10-CM

## 2023-07-07 DIAGNOSIS — K702 Alcoholic fibrosis and sclerosis of liver: Secondary | ICD-10-CM

## 2023-07-07 DIAGNOSIS — M7752 Other enthesopathy of left foot: Secondary | ICD-10-CM

## 2023-07-07 DIAGNOSIS — N138 Other obstructive and reflux uropathy: Secondary | ICD-10-CM

## 2023-07-07 DIAGNOSIS — F1020 Alcohol dependence, uncomplicated: Secondary | ICD-10-CM

## 2023-07-07 MED ORDER — TAMSULOSIN HCL 0.4 MG PO CAPS: 0.4000 mg | ORAL_CAPSULE | Freq: Every day | ORAL | 3 refills | Status: DC

## 2023-07-07 NOTE — Progress Notes (Signed)
 [facility] [address] [place], [place] [address]  Internal MEDICINE  Office Visit Note  Patient Name: Matthew Webster  [id number]  [id number]  Date of Service: 07/07/2023  Chief Complaint  Patient presents with   Depression   Hypertension   Medicare Wellness    HPI Sigmond presents for a medicare annual wellness visit.  Well-appearing 73 y.o. male with diabetes, hypertension, high cholesterol, alcohol use disorder, liver fibrosis, and elevated PSA Routine CRC screening: colonoscopy done in 2021. Eye exam: goes every 6 months, last visit was February.  foot exam: has some problems, needs referral  Labs: still due for labs  New or worsening pain: none  Other concerns: still drinking about the same amount, still wants to see a counselor, wants to have a list of possible counselors sent to him on mychart.  Slightly elevated PSA, sees urology.  Sees Dr. Rexene Rattler for dermatology      07/07/2023    1:17 PM  MMSE - Mini Mental State Exam  Orientation to time 0  Orientation to Place 5  Registration 3  Attention/ Calculation 5  Recall 3  Language- name 2 objects 2  Language- repeat 1  Language- follow 3 step command 3  Language- read & follow direction 1  Write a sentence 1  Copy design 1  Total score 25    Functional Status Survey: Is the patient deaf or have difficulty hearing?: No Does the patient have difficulty seeing, even when wearing glasses/contacts?: No Does the patient have difficulty concentrating, remembering, or making decisions?: No Does the patient have difficulty walking or climbing stairs?: No Does the patient have difficulty dressing or bathing?: No Does the patient have difficulty doing errands alone such as visiting a doctor's office or shopping?: No     04/22/2020    9:45 PM 04/23/2020    8:00 AM 11/27/2022    2:56 PM 07/07/2023    1:15 PM 07/07/2023    1:16 PM  Fall Risk  Falls in the past year?   0 0 0  Was there an injury with  Fall?   0 0 0  Fall Risk Category Calculator   0 0 0  (RETIRED) Patient Fall Risk Level High fall risk  High fall risk      Patient at Risk for Falls Due to   No Fall Risks No Fall Risks No Fall Risks  Fall risk Follow up   Falls evaluation completed Falls evaluation completed Falls evaluation completed     Data saved with a previous flowsheet row definition       07/07/2023    1:16 PM  Depression screen PHQ 2/9  Decreased Interest 0  Down, Depressed, Hopeless 0  PHQ - 2 Score 0       Current Medication: Outpatient Encounter Medications as of 07/07/2023  Medication Sig   latanoprost  (XALATAN ) 0.005 % ophthalmic solution Place 1 drop into both eyes at bedtime.   losartan -hydrochlorothiazide  (HYZAAR) 50-12.5 MG tablet Take 1 tablet by mouth daily.   sildenafil (REVATIO) 20 MG tablet TAKE 1 TAB AS NEEDED.   timolol  (TIMOPTIC ) 0.5 % ophthalmic solution instill 1 drop into left eye every morning   venlafaxine  XR (EFFEXOR -XR) 150 MG 24 hr capsule Take 1 capsule (150 mg total) by mouth at bedtime.   [DISCONTINUED] tamsulosin  (FLOMAX ) 0.4 MG CAPS capsule TAKE 1 CAPSULE(0.4 MG) BY MOUTH DAILY 30 MINUTES AFTER THE SAME MEAL   tamsulosin  (FLOMAX ) 0.4 MG CAPS capsule Take 1 capsule (0.4  mg total) by mouth daily. 30 minutes after the same meal   No facility-administered encounter medications on file as of 07/07/2023.    Surgical History: Past Surgical History:  Procedure Laterality Date   CATARACT EXTRACTION W/ INTRAOCULAR LENS IMPLANT Left 2011   CATARACT EXTRACTION W/PHACO Right 05/26/2019   Procedure: CATARACT EXTRACTION PHACO AND INTRAOCULAR LENS PLACEMENT (IOC) RIGHT 10.64  01:10.2  15.1%;  Surgeon: Mittie Gaskin, MD;  Location: [facility];  Service: Ophthalmology;  Laterality: Right;   COLONOSCOPY  2016   COLONOSCOPY WITH PROPOFOL  N/A 07/22/2017   Procedure: COLONOSCOPY WITH PROPOFOL ;  Surgeon: Gaylyn Gladis PENNER, MD;  [facility] END[facility]COPY;  Service: Endoscopy;   Laterality: N/A;   COLONOSCOPY WITH PROPOFOL  N/A 07/21/2019   Procedure: COLONOSCOPY WITH PROPOFOL ;  Surgeon: Toledo, Ladell POUR, MD;  Location: [facility] ENDOSCOPY;  Service: Gastroenterology;  Laterality: N/A;   CYSTOSCOPY WITH INSERTION OF UROLIFT N/A 07/08/2022   Procedure: CYSTOSCOPY WITH INSERTION OF UROLIFT;  Surgeon: Penne Knee, MD;  Location: [facility] ORS;  Service: Urology;  Laterality: N/A;   DIAGNOSTIC LAPAROSCOPY     ELBOW FRACTURE SURGERY Left    screws and plate   ESOPHAGOGASTRODUODENOSCOPY N/A 07/21/2019   Procedure: ESOPHAGOGASTRODUODENOSCOPY (EGD);  Surgeon: Toledo, Ladell POUR, MD;  Location: [facility] ENDOSCOPY;  Service: Gastroenterology;  Laterality: N/A;   LAPAROSCOPIC BOWEL RESECTION     8 inches colon removed with polyps   RETINAL DETACHMENT SURGERY Left 10/24/2008   TONSILLECTOMY     WISDOM TOOTH EXTRACTION      Medical History: Past Medical History:  Diagnosis Date   Actinic keratosis    Anxiety    Benign non-nodular prostatic hyperplasia with lower urinary tract symptoms    Cancer of sigmoid colon (HCC) 06/20/2014   Depression 11/24/2013   Overview:  Questionable bipolar   Detached retina, left 2010   Diverticulosis    Fatty liver    on CT   Hemorrhoids    History of chicken pox    Hx of basal cell carcinoma 12/24/2016   Left anterior ear helix   Hx of basal cell carcinoma 06/24/2017   Left upper forehead   Hx of basal cell carcinoma 08/14/2017   Right upper forehead   Hypertension 11/24/2013   Personal history of colonic polyps 02/24/2014   Recurrent syncope     Family History: Family History  Problem Relation Age of Onset   CVA Mother    Diabetes Father    Prostate cancer Neg Hx    Bladder Cancer Neg Hx    Kidney cancer Neg Hx     Social History   Socioeconomic History   Marital status: Married    Spouse name: Rojelio   Number of children: 1   Years of education: Not on file   Highest education level: Not on file  Occupational History    Not on file  Tobacco Use   Smoking status: Never   Smokeless tobacco: Never  Vaping Use   Vaping status: Never Used  Substance and Sexual Activity   Alcohol use: Yes    Alcohol/week: 8.0 standard drinks of alcohol    Types: 8 Cans of beer per week    Comment: 8 every night/4 maybe   Drug use: No   Sexual activity: Yes  Other Topics Concern   Not on file  Social History Narrative   Not on file   Social Drivers of Health   Financial Resource Strain: Low Risk  (11/06/2022)   Received from [facility]  Overall Financial Resource Strain (CARDIA)    Difficulty of Paying Living Expenses: Not very hard  Food Insecurity: No Food Insecurity (11/06/2022)   Received from [facility]   Hunger Vital Sign    Within the past 12 months, you worried that your food would run out before you got the money to buy more.: Never true    Within the past 12 months, the food you bought just didn't last and you didn't have money to get more.: Never true  Transportation Needs: No Transportation Needs (11/06/2022)   Received from [facility] - Transportation    In the past 12 months, has lack of transportation kept you from medical appointments or from getting medications?: No    Lack of Transportation (Non-Medical): No  Physical Activity: Not on file  Stress: Not on file  Social Connections: Not on file  Intimate Partner Violence: Not on file      Review of Systems  Constitutional:  Positive for fatigue and unexpected weight change. Negative for activity change, appetite change, chills and fever.  HENT:  Positive for postnasal drip. Negative for congestion, ear pain, rhinorrhea, sneezing, sore throat and trouble swallowing.   Eyes: Negative.  Negative for redness.  Respiratory: Negative.  Negative for cough, chest tightness, shortness of breath and wheezing.   Cardiovascular: Negative.  Negative for chest pain and palpitations.   Gastrointestinal: Negative.  Negative for abdominal pain, blood in stool, constipation, diarrhea, nausea and vomiting.  Endocrine: Negative.  Negative for polydipsia and polyuria.  Genitourinary: Negative.  Negative for difficulty urinating, dysuria, frequency, hematuria and urgency.  Musculoskeletal: Negative.  Negative for arthralgias, back pain, joint swelling, myalgias and neck pain.  Skin: Negative.  Negative for rash and wound.  Allergic/Immunologic: Negative.  Negative for immunocompromised state.  Neurological: Negative.  Negative for dizziness, tremors, seizures, numbness and headaches.  Hematological: Negative.  Negative for adenopathy. Does not bruise/bleed easily.  Psychiatric/Behavioral:  Positive for behavioral problems (Depression), decreased concentration and sleep disturbance. Negative for self-injury and suicidal ideas. The patient is not nervous/anxious.     Vital Signs: BP 130/80   Pulse 76   Temp 98.3 F (36.8 C)   Resp 16   Ht 5' 6 (1.676 m)   Wt 213 lb 6.4 oz (96.8 kg)   SpO2 93%   BMI 34.44 kg/m    Physical Exam Vitals reviewed.  Constitutional:      General: He is not in acute distress.    Appearance: Normal appearance. He is well-developed. He is obese. He is not ill-appearing or diaphoretic.  HENT:     Head: Normocephalic and atraumatic.     Right Ear: Tympanic membrane, ear canal and external ear normal.     Left Ear: Tympanic membrane, ear canal and external ear normal.     Nose: Nose normal. No congestion or rhinorrhea.     Mouth/Throat:     Mouth: Mucous membranes are moist.     Pharynx: Oropharynx is clear. No oropharyngeal exudate or posterior oropharyngeal erythema.  Eyes:     General: No scleral icterus.       Right eye: No discharge.        Left eye: No discharge.     Extraocular Movements: Extraocular movements intact.     Conjunctiva/sclera: Conjunctivae normal.     Pupils: Pupils are equal, round, and reactive to light.  Neck:      Thyroid: No thyromegaly.     Vascular: No JVD.  Trachea: No tracheal deviation.  Cardiovascular:     Rate and Rhythm: Normal rate and regular rhythm.     Pulses: Normal pulses.     Heart sounds: Normal heart sounds. No murmur heard.    No friction rub. No gallop.  Pulmonary:     Effort: Pulmonary effort is normal. No respiratory distress.     Breath sounds: Normal breath sounds. No stridor. No wheezing or rales.  Chest:     Chest wall: No tenderness.  Abdominal:     General: Bowel sounds are normal. There is no distension.     Palpations: Abdomen is soft. There is no mass.     Tenderness: There is no abdominal tenderness. There is no guarding or rebound.  Musculoskeletal:        General: No tenderness or deformity. Normal range of motion.     Cervical back: Normal range of motion and neck supple.  Lymphadenopathy:     Cervical: No cervical adenopathy.  Skin:    General: Skin is warm and dry.     Capillary Refill: Capillary refill takes less than 2 seconds.     Coloration: Skin is not pale.     Findings: No erythema or rash.  Neurological:     Mental Status: He is alert and oriented to person, place, and time.     Cranial Nerves: No cranial nerve deficit.     Motor: No abnormal muscle tone.     Coordination: Coordination normal.     Deep Tendon Reflexes: Reflexes are normal and symmetric.  Psychiatric:        Mood and Affect: Mood normal.        Behavior: Behavior normal.        Thought Content: Thought content normal.        Judgment: Judgment normal.        Assessment/Plan: 1. Encounter for subsequent annual wellness visit (AWV) in Medicare patient (Primary) Age-appropriate preventive screenings and vaccinations discussed. Routine labs for health maintenance will be ordered. PHM updated.    2. Type 2 diabetes mellitus with other specified complication, without long-term current use of insulin (HCC) Continue diabetic diet, will repeat A1c at next visit.   3.  Hypertension associated with type 2 diabetes mellitus (HCC) Stable, Continue losartan -hydrochlorothiazide  as prescribed.   4. Hyperlipidemia associated with type 2 diabetes mellitus (HCC) Will repeat labs   5. 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 Bunion of great toe of left foot Referred to podiatry - Ambulatory referral to Podiatry  7. Bone spur of left foot Referred to podiatry - Ambulatory referral to Podiatry  8. Benign prostatic hyperplasia with urinary obstruction Continue tamsulosin  as prescribed.  - tamsulosin  (FLOMAX ) 0.4 MG CAPS capsule; Take 1 capsule (0.4 mg total) by mouth daily. 30 minutes after the same meal  Dispense: 90 capsule; Refill: 3  9. Alcohol use disorder, severe, dependence (HCC) No improvement, still drinking up to 120 ounces of alcohol every night  10. Moderate episode of recurrent major depressive disorder (HCC) On medications for this, continue as prescribed.       General Counseling: Carlin oakland understanding of the findings of todays visit and agrees with plan of treatment. I have discussed any further diagnostic evaluation that may be needed or ordered today. We also reviewed his medications today. he has been encouraged to call the office with any questions or concerns that should arise related to todays visit.    Orders Placed This  Encounter  Procedures   Ambulatory referral to Podiatry    Meds ordered this encounter  Medications   tamsulosin  (FLOMAX ) 0.4 MG CAPS capsule    Sig: Take 1 capsule (0.4 mg total) by mouth daily. 30 minutes after the same meal    Dispense:  90 capsule    Refill:  3    Return in about 3 months (around 10/07/2023) for F/U, Jashaun Penrose PCP.   Total time spent:30 Minutes Time spent includes review of chart, medications, test results, and follow up plan with the patient.   [place] Controlled Substance Database was reviewed by me.  This patient was seen by Mardy Maxin,  FNP-C in collaboration with Dr. Sigrid Bathe as a part of collaborative care agreement.  Docie Abramovich R. Maxin, MSN, FNP-C Internal medicine

## 2023-08-06 ENCOUNTER — Encounter: Payer: Self-pay | Admitting: Nurse Practitioner

## 2023-08-11 ENCOUNTER — Other Ambulatory Visit: Payer: Self-pay

## 2023-08-11 DIAGNOSIS — R972 Elevated prostate specific antigen [PSA]: Secondary | ICD-10-CM

## 2023-08-13 ENCOUNTER — Ambulatory Visit: Payer: Self-pay | Admitting: Urology

## 2023-09-02 NOTE — Progress Notes (Unsigned)
 09/04/2023 12:27 PM   Matthew Webster Generous 13-Mar-1950 [id number]  Referring provider: Glover Lenis, MD [phone] [place],  [place] [address]  Urological history: 1. Elevated PSA - PSA (2017) 7.9 - negative bx (2015)  - prostate MRI (2017) PI-RADS 2 lesion  2. BPH with LU TS - UroLift (07/2022) - tamsulosin  0.4 mg   Chief Complaint  Patient presents with   Follow-up   Elevated PSA   HPI: Matthew Webster is a 73 y.o. man who presents today for yearly follow up.    Previous records reviewed.   He is taking tamsulosin  0.4 mg daily to help with his LU TS.  Patient denies any modifying or aggravating factors.  Patient denies any recent UTI's, gross hematuria, dysuria or suprapubic/flank pain.  Patient denies any fevers, chills, nausea or vomiting.    He is having satisfactory erections with the sildenafil  20 mg on-demand dosing.  He states sometimes he even only has to take one half of a 20 mg tablet to achieve a satisfactory erection.    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    Cancer of sigmoid colon (HCC) 06/20/2014   Depression 11/24/2013   Overview:  Questionable bipolar   Detached retina, left 2010   Diverticulosis    Fatty liver    on CT   Hemorrhoids    History of chicken pox    Hx of basal cell carcinoma 12/24/2016   Left anterior ear helix   Hx of basal cell carcinoma 06/24/2017   Left upper forehead   Hx of basal cell carcinoma 08/14/2017   Right upper forehead   Hypertension 11/24/2013   Personal history of colonic polyps 02/24/2014   Recurrent syncope     Surgical History: Past Surgical History:  Procedure Laterality Date   CATARACT EXTRACTION W/ INTRAOCULAR LENS IMPLANT Left 2011   CATARACT EXTRACTION W/PHACO Right  05/26/2019   Procedure: CATARACT EXTRACTION PHACO AND INTRAOCULAR LENS PLACEMENT (IOC) RIGHT 10.64  01:10.2  15.1%;  Surgeon: Mittie Gaskin, MD;  Location: [facility];  Service: Ophthalmology;  Laterality: Right;   COLONOSCOPY  2016   COLONOSCOPY WITH PROPOFOL  N/A 07/22/2017   Procedure: COLONOSCOPY WITH PROPOFOL ;  Surgeon: Gaylyn Gladis PENNER, MD;  Location: [facility] ENDOSCOPY;  Service: Endoscopy;  Laterality: N/A;   COLONOSCOPY WITH PROPOFOL  N/A 07/21/2019   Procedure: COLONOSCOPY WITH PROPOFOL ;  Surgeon: Toledo, Ladell POUR, MD;  Location: [facility] ENDOSCOPY;  Service: Gastroenterology;  Laterality: N/A;   CYSTOSCOPY WITH INSERTION OF UROLIFT N/A 07/08/2022   Procedure: CYSTOSCOPY WITH INSERTION OF UROLIFT;  Surgeon: Penne Knee, MD;  Location: [facility] ORS;  Service: Urology;  Laterality: N/A;   DIAGNOSTIC LAPAROSCOPY     ELBOW FRACTURE SURGERY Left    screws and plate   ESOPHAGOGASTRODUODENOSCOPY N/A 07/21/2019   Procedure: ESOPHAGOGASTRODUODENOSCOPY (EGD);  Surgeon: Toledo, Ladell POUR, MD;  Location: [facility] ENDOSCOPY;  Service: Gastroenterology;  Laterality: N/A;   LAPAROSCOPIC BOWEL RESECTION     8 inches colon removed with polyps   RETINAL DETACHMENT SURGERY Left 10/24/2008   TONSILLECTOMY     WISDOM TOOTH EXTRACTION      Home Medications:  Allergies as of 09/04/2023       Reactions  Alphagan P [brimonidine Tartrate] Swelling   Eye redness, swelling, and discharge   Codeine    constipation   Shellfish Allergy Diarrhea, Nausea And Vomiting   Raw clams   Penicillin V Potassium Rash   Penicillins Rash        Medication List        Accurate as of September 04, 2023 12:27 PM. If you have any questions, ask your nurse or doctor.          latanoprost  0.005 % ophthalmic solution Commonly known as: XALATAN  Place 1 drop into both eyes at bedtime.   losartan -hydrochlorothiazide  50-12.5 MG tablet Commonly known as: HYZAAR Take 1 tablet by mouth daily.   sildenafil  20  MG tablet Commonly known as: REVATIO  Take 1 tablet (20 mg total) by mouth as needed. What changed: See the new instructions.   tamsulosin  0.4 MG Caps capsule Commonly known as: FLOMAX  Take 1 capsule (0.4 mg total) by mouth daily. 30 minutes after the same meal   timolol  0.5 % ophthalmic solution Commonly known as: TIMOPTIC  instill 1 drop into left eye every morning   venlafaxine  XR 150 MG 24 hr capsule Commonly known as: EFFEXOR -XR Take 1 capsule (150 mg total) by mouth at bedtime.        Allergies:  Allergies  Allergen Reactions   Alphagan P [Brimonidine Tartrate] Swelling    Eye redness, swelling, and discharge   Codeine     constipation   Shellfish Allergy Diarrhea and Nausea And Vomiting    Raw clams   Penicillin V Potassium Rash   Penicillins Rash    Family History: Family History  Problem Relation Age of Onset   CVA Mother    Diabetes Father    Prostate cancer Neg Hx    Bladder Cancer Neg Hx    Kidney cancer Neg Hx     Social History: See HPI for pertinent social history  ROS: Pertinent ROS in HPI  Physical Exam: BP (!) 143/89 (BP Location: Left Arm, Patient Position: Sitting, Cuff Size: Normal)   Pulse 73   Ht 5' 5.5 (1.664 m)   Wt 210 lb (95.3 kg)   SpO2 91%   BMI 34.41 kg/m   Constitutional:  Well nourished. Alert and oriented, No acute distress. HEENT: [place] AT, moist mucus membranes.  Trachea midline Cardiovascular: No clubbing, cyanosis, or edema. Respiratory: Normal respiratory effort, no increased work of breathing. GU: No CVA tenderness.  No bladder fullness or masses.  Patient with circumcised phallus.   Urethral meatus is patent.  No penile discharge. No penile lesions or rashes. Scrotum without lesions, cysts, rashes and/or edema.  Testicles are located scrotally bilaterally. No masses are appreciated in the testicles. Left and right epididymis are normal. Rectal: Patient with  normal sphincter tone. Anus and perineum without scarring or  rashes. No rectal masses are appreciated. Prostate is approximately 45  grams, rubbery nodule in the mid gland.  Seminal vesicles could not be palpated.  Neurologic: Grossly intact, no focal deficits, moving all 4 extremities. Psychiatric: Normal mood and affect.  Laboratory Data: See EPIC and HPI  I have reviewed the labs.   Pertinent Imaging:  09/04/23 10:48  Scan Result 43ml    Assessment & Plan:    1. Elevated PSA - PSA pending  2. BPH with LU TS - stable moderate symptoms  - PSA pending - DRE - no firm nodules - PVR < 300 cc  -- encouraged avoiding bladder irritants, fluid restriction before bedtime and timed voiding's -  Continue tamsulosin  0.4 mg daily  - educated on red flag symptoms: acute retention, gross hematuria, fever, severe pain - advised to call clinic or go to the ED if these occur - return to clinic in 12 months symptom re-evaluation   3. ED - He is having satisfactory erections with sildenafil  20 mg on demand dosing - He was wondering if there was any other new treatments for erectile dysfunction, we discussed daily tadalafil 5 mg, but he deferred   Return in about 1 year (around 09/03/2024) for PSA/DRE/PVR .  These notes generated with voice recognition software. I apologize for typographical errors.  CLOTILDA HELON RIGGERS  [facility] [address]  [address] [place], [place] [address] [phone]

## 2023-09-04 ENCOUNTER — Ambulatory Visit: Admitting: Urology

## 2023-09-04 ENCOUNTER — Encounter: Payer: Self-pay | Admitting: Urology

## 2023-09-04 VITALS — BP 143/89 | HR 73 | Ht 65.5 in | Wt 210.0 lb

## 2023-09-04 DIAGNOSIS — R972 Elevated prostate specific antigen [PSA]: Secondary | ICD-10-CM | POA: Diagnosis not present

## 2023-09-04 DIAGNOSIS — N401 Enlarged prostate with lower urinary tract symptoms: Secondary | ICD-10-CM

## 2023-09-04 DIAGNOSIS — N5201 Erectile dysfunction due to arterial insufficiency: Secondary | ICD-10-CM | POA: Diagnosis not present

## 2023-09-04 DIAGNOSIS — N138 Other obstructive and reflux uropathy: Secondary | ICD-10-CM

## 2023-09-04 LAB — BLADDER SCAN AMB NON-IMAGING

## 2023-09-04 MED ORDER — TAMSULOSIN HCL 0.4 MG PO CAPS: 0.4000 mg | ORAL_CAPSULE | Freq: Every day | ORAL | 3 refills | Status: AC

## 2023-09-04 MED ORDER — SILDENAFIL CITRATE 20 MG PO TABS: 20.0000 mg | ORAL_TABLET | ORAL | 3 refills | Status: AC | PRN

## 2023-09-05 LAB — PSA: Prostate Specific Ag, Serum: 6.8 ng/mL — ABNORMAL HIGH (ref 0.0–4.0)

## 2023-09-07 ENCOUNTER — Ambulatory Visit: Payer: Self-pay | Admitting: Urology

## 2023-09-08 DIAGNOSIS — H04123 Dry eye syndrome of bilateral lacrimal glands: Secondary | ICD-10-CM | POA: Diagnosis not present

## 2023-09-08 DIAGNOSIS — Z961 Presence of intraocular lens: Secondary | ICD-10-CM | POA: Diagnosis not present

## 2023-09-08 DIAGNOSIS — H401131 Primary open-angle glaucoma, bilateral, mild stage: Secondary | ICD-10-CM | POA: Diagnosis not present

## 2023-09-08 NOTE — Progress Notes (Signed)
 Called patient and no answer.

## 2023-10-01 ENCOUNTER — Other Ambulatory Visit

## 2023-10-03 DIAGNOSIS — Z9889 Other specified postprocedural states: Secondary | ICD-10-CM | POA: Diagnosis not present

## 2023-10-03 DIAGNOSIS — M25522 Pain in left elbow: Secondary | ICD-10-CM | POA: Diagnosis not present

## 2023-10-03 DIAGNOSIS — M7022 Olecranon bursitis, left elbow: Secondary | ICD-10-CM | POA: Diagnosis not present

## 2023-10-03 DIAGNOSIS — S52022D Displaced fracture of olecranon process without intraarticular extension of left ulna, subsequent encounter for closed fracture with routine healing: Secondary | ICD-10-CM | POA: Diagnosis not present

## 2023-10-06 ENCOUNTER — Encounter: Payer: Self-pay | Admitting: Nurse Practitioner

## 2023-10-06 ENCOUNTER — Ambulatory Visit: Admitting: Nurse Practitioner

## 2023-10-06 VITALS — BP 138/72 | HR 75 | Temp 98.4°F | Resp 16 | Ht 65.5 in | Wt 214.4 lb

## 2023-10-06 DIAGNOSIS — R972 Elevated prostate specific antigen [PSA]: Secondary | ICD-10-CM | POA: Diagnosis not present

## 2023-10-06 DIAGNOSIS — I152 Hypertension secondary to endocrine disorders: Secondary | ICD-10-CM

## 2023-10-06 DIAGNOSIS — F1020 Alcohol dependence, uncomplicated: Secondary | ICD-10-CM

## 2023-10-06 DIAGNOSIS — E1169 Type 2 diabetes mellitus with other specified complication: Secondary | ICD-10-CM

## 2023-10-06 DIAGNOSIS — E1159 Type 2 diabetes mellitus with other circulatory complications: Secondary | ICD-10-CM | POA: Diagnosis not present

## 2023-10-06 DIAGNOSIS — K702 Alcoholic fibrosis and sclerosis of liver: Secondary | ICD-10-CM

## 2023-10-06 DIAGNOSIS — E785 Hyperlipidemia, unspecified: Secondary | ICD-10-CM

## 2023-10-06 NOTE — Progress Notes (Unsigned)
 [facility] [address] [place], [place] [address]  Internal MEDICINE  Office Visit Note  Patient Name: Matthew Webster  [id number]  [id number]  Date of Service: 10/06/2023  Chief Complaint  Patient presents with  . Depression  . Hypertension  . Follow-up    HPI Matthew Webster presents for a follow-up visit for liver disease, elevated PSA, hypertension, high cholesterol, type 2 diabetes, and alcohol abuse.  Elevated PSA 6.8, sees urology. Type 2 diabetes -- had a random glucose >200, last A1c was in April this year at 6.0.  Hypertension -- he takes BP medication and this is controlled.  High cholesterol -- he is not currently on statin therapy  Liver disease -- he is a heavy drinker. Has tried cutting back and then started drinking more again. According to screening scores based on prior labs, significant fibrosis is likely. He is overdue for labs and will need new orders soon.  FIB-4 score 3.53, significant fibrosis very likely APRI score 0.76, further testing required. NAFLD Fibrosis score: 2.047, presence of significant fibrosis.  6.    Alcohol abuse -- drinks 4 40-ounce beers every night. High score on AUDIT and AUDIT-C. Refused further intervention for alcohol use.     Current Medication: Outpatient Encounter Medications as of 10/06/2023  Medication Sig  . latanoprost  (XALATAN ) 0.005 % ophthalmic solution Place 1 drop into both eyes at bedtime.  . losartan -hydrochlorothiazide  (HYZAAR) 50-12.5 MG tablet Take 1 tablet by mouth daily.  . sildenafil  (REVATIO ) 20 MG tablet Take 1 tablet (20 mg total) by mouth as needed.  . tamsulosin  (FLOMAX ) 0.4 MG CAPS capsule Take 1 capsule (0.4 mg total) by mouth daily. 30 minutes after the same meal  . timolol  (TIMOPTIC ) 0.5 % ophthalmic solution instill 1 drop into left eye every morning  . venlafaxine  XR (EFFEXOR -XR) 150 MG 24 hr capsule Take 1 capsule (150 mg total) by mouth at bedtime.   No facility-administered encounter  medications on file as of 10/06/2023.    Surgical History: Past Surgical History:  Procedure Laterality Date  . CATARACT EXTRACTION W/ INTRAOCULAR LENS IMPLANT Left 2011  . CATARACT EXTRACTION W/PHACO Right 05/26/2019   Procedure: CATARACT EXTRACTION PHACO AND INTRAOCULAR LENS PLACEMENT (IOC) RIGHT 10.64  01:10.2  15.1%;  Surgeon: Matthew Gaskin, MD;  Location: [facility];  Service: Ophthalmology;  Laterality: Right;  . COLONOSCOPY  2016  . COLONOSCOPY WITH PROPOFOL  N/A 07/22/2017   Procedure: COLONOSCOPY WITH PROPOFOL ;  Surgeon: Matthew Gladis PENNER, MD;  Location: [facility];  Service: Endoscopy;  Laterality: N/A;  . COLONOSCOPY WITH PROPOFOL  N/A 07/21/2019   Procedure: COLONOSCOPY WITH PROPOFOL ;  Surgeon: Toledo, Ladell POUR, MD;  Location: [facility];  Service: Gastroenterology;  Laterality: N/A;  . CYSTOSCOPY WITH INSERTION OF UROLIFT N/A 07/08/2022   Procedure: CYSTOSCOPY WITH INSERTION OF UROLIFT;  Surgeon: Matthew Knee, MD;  Location: [facility];  Service: Urology;  Laterality: N/A;  . DIAGNOSTIC LAPAROSCOPY    . ELBOW FRACTURE SURGERY Left    screws and plate  . ESOPHAGOGASTRODUODENOSCOPY N/A 07/21/2019   Procedure: ESOPHAGOGASTRODUODENOSCOPY (EGD);  Surgeon: Toledo, Ladell POUR, MD;  Location: [facility];  Service: Gastroenterology;  Laterality: N/A;  . LAPAROSCOPIC BOWEL RESECTION     8 inches colon removed with polyps  . RETINAL DETACHMENT SURGERY Left 10/24/2008  . TONSILLECTOMY    . WISDOM TOOTH EXTRACTION      Medical History: Past Medical History:  Diagnosis Date  . Actinic keratosis   . Anxiety   . Benign non-nodular prostatic  hyperplasia with lower urinary tract symptoms   . Cancer of sigmoid colon (HCC) 06/20/2014  . Depression 11/24/2013   Overview:  Questionable bipolar  . Detached retina, left 2010  . Diverticulosis   . Fatty liver    on CT  . Hemorrhoids   . History of chicken pox   . Hx of basal cell carcinoma 12/24/2016   Left  anterior ear helix  . Hx of basal cell carcinoma 06/24/2017   Left upper forehead  . Hx of basal cell carcinoma 08/14/2017   Right upper forehead  . Hypertension 11/24/2013  . Personal history of colonic polyps 02/24/2014  . Recurrent syncope     Family History: Family History  Problem Relation Age of Onset  . CVA Mother   . Diabetes Father   . Prostate cancer Neg Hx   . Bladder Cancer Neg Hx   . Kidney cancer Neg Hx     Social History   Socioeconomic History  . Marital status: Married    Spouse name: Matthew Webster  . Number of children: 1  . Years of education: Not on file  . Highest education level: Not on file  Occupational History  . Not on file  Tobacco Use  . Smoking status: Never  . Smokeless tobacco: Never  Vaping Use  . Vaping status: Never Used  Substance and Sexual Activity  . Alcohol use: Yes    Alcohol/week: 8.0 standard drinks of alcohol    Types: 8 Cans of beer per week    Comment: 8 every night/4 maybe  . Drug use: No  . Sexual activity: Yes  Other Topics Concern  . Not on file  Social History Narrative  . Not on file   Social Drivers of Health   Financial Resource Strain: Low Risk  (10/03/2023)   Received from [facility]   Overall Financial Resource Strain (CARDIA)   . Difficulty of Paying Living Expenses: Not hard at all  Food Insecurity: No Food Insecurity (10/03/2023)   Received from [facility]   Hunger Vital Sign   . Within the past 12 months, you worried that your food would run out before you got the money to buy more.: Never true   . Within the past 12 months, the food you bought just didn't last and you didn't have money to get more.: Never true  Transportation Needs: No Transportation Needs (10/03/2023)   Received from [facility]   [facility] - Transportation   . In the past 12 months, has lack of transportation kept you from medical appointments or from getting medications?: No   . Lack  of Transportation (Non-Medical): No  Physical Activity: Not on file  Stress: Not on file  Social Connections: Not on file  Intimate Partner Violence: Not on file      Review of Systems  Constitutional:  Positive for fatigue and unexpected weight change. Negative for chills.  HENT:  Positive for postnasal drip. Negative for congestion, rhinorrhea, sneezing and sore throat.   Eyes:  Negative for redness.  Respiratory:  Negative for cough, chest tightness, shortness of breath and wheezing.   Cardiovascular:  Negative for chest pain and palpitations.  Gastrointestinal:  Negative for abdominal pain, constipation, diarrhea, nausea and vomiting.  Endocrine: Negative for polydipsia and polyuria.  Genitourinary:  Negative for dysuria and frequency.  Musculoskeletal:  Negative for arthralgias, back pain, joint swelling and neck pain.  Skin:  Negative for rash.  Neurological: Negative.  Negative for  tremors and numbness.  Hematological:  Negative for adenopathy. Does not bruise/bleed easily.  Psychiatric/Behavioral:  Positive for behavioral problems (Depression), decreased concentration and sleep disturbance. Negative for self-injury and suicidal ideas. The patient is not nervous/anxious.     Vital Signs: BP 138/72   Pulse 75   Temp 98.4 F (36.9 C)   Resp 16   Ht 5' 5.5 (1.664 m)   Wt 214 lb 6.4 oz (97.3 kg)   SpO2 94%   BMI 35.14 kg/m    Physical Exam Vitals reviewed.  Constitutional:      General: He is not in acute distress.    Appearance: Normal appearance. He is obese. He is not ill-appearing.  HENT:     Head: Normocephalic and atraumatic.  Eyes:     Pupils: Pupils are equal, round, and reactive to light.  Cardiovascular:     Rate and Rhythm: Normal rate and regular rhythm.  Pulmonary:     Effort: Pulmonary effort is normal. No respiratory distress.  Neurological:     Mental Status: He is alert and oriented to person, place, and time.  Psychiatric:        Mood and  Affect: Mood normal.        Behavior: Behavior normal.        Assessment/Plan: 1. 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 Type 2 diabetes mellitus with other specified complication, without long-term current use of insulin (HCC) *** - US  ELASTOGRAPHY LIVER; Future - CBC with Differential/Platelet - CMP14+EGFR - CBC with Differential/Platelet - CMP14+EGFR - Lipid Profile - Hgb A1C w/o eAG - Vitamin D (25 hydroxy) - Vitamin B1 - INR/PT - Gamma GT - Ceruloplasmin - Iron, TIBC and Ferritin Panel - Alpha-1-antitrypsin  3. Hypertension associated with type 2 diabetes mellitus (HCC) *** - US  ELASTOGRAPHY LIVER; Future - CBC with Differential/Platelet - CMP14+EGFR - CBC with Differential/Platelet - CMP14+EGFR - Lipid Profile - Hgb A1C w/o eAG - Vitamin D (25 hydroxy) - Vitamin B1 - INR/PT - Gamma GT - Ceruloplasmin - Iron, TIBC and Ferritin Panel - Alpha-1-antitrypsin  4. Hyperlipidemia associated with type 2 diabetes mellitus (HCC) *** - US  ELASTOGRAPHY LIVER; Future - CBC with Differential/Platelet - CMP14+EGFR - CBC with Differential/Platelet - CMP14+EGFR - Lipid Profile - Hgb A1C w/o eAG - Vitamin D (25 hydroxy) - Vitamin B1 - INR/PT - Gamma GT - Ceruloplasmin - Iron, TIBC and Ferritin Panel - Alpha-1-antitrypsin  5. Elevated PSA, less than 10 ng/ml ***  6. Alcohol use disorder, severe, dependence (HCC) *** - US  ELASTOGRAPHY LIVER; Future - CBC with Differential/Platelet - CMP14+EGFR - CBC with Differential/Platelet - CMP14+EGFR - Lipid Profile - Hgb A1C w/o eAG - Vitamin D (25 hydroxy) - Vitamin B1 - INR/PT - Gamma GT - Ceruloplasmin -  Iron, TIBC and Ferritin Panel - Alpha-1-antitrypsin   General Counseling: Beryl verbalizes understanding of the findings of todays visit and agrees with plan of treatment. I have discussed any further diagnostic evaluation that may be needed or ordered today. We also reviewed his medications today. he has been encouraged to call the office with any questions or concerns that should arise related to todays visit.    Orders Placed This Encounter  Procedures  .  US  ELASTOGRAPHY LIVER  . CBC with Differential/Platelet  . CMP14+EGFR  . CBC with Differential/Platelet  . CMP14+EGFR  . Lipid Profile  . Hgb A1C w/o eAG  . Vitamin D (25 hydroxy)  . Vitamin B1  . INR/PT  . Gamma GT  . Ceruloplasmin  . Iron, TIBC and Ferritin Panel  . Alpha-1-antitrypsin    No orders of the defined types were placed in this encounter.   Return in about 6 months (around 04/04/2024) for F/U, Senita Corredor PCP.   Total time spent:30 Minutes Time spent includes review of chart, medications, test results, and follow up plan with the patient.   [place] Controlled Substance Database was reviewed by me.  This patient was seen by Mardy Maxin, FNP-C in collaboration with Dr. Sigrid Bathe as a part of collaborative care agreement.   Ramiya Delahunty R. Maxin, MSN, FNP-C Internal medicine

## 2023-10-28 ENCOUNTER — Other Ambulatory Visit: Payer: Self-pay

## 2023-10-28 DIAGNOSIS — R972 Elevated prostate specific antigen [PSA]: Secondary | ICD-10-CM

## 2023-11-04 ENCOUNTER — Ambulatory Visit (INDEPENDENT_AMBULATORY_CARE_PROVIDER_SITE_OTHER): Admitting: Dermatology

## 2023-11-04 ENCOUNTER — Encounter: Payer: Self-pay | Admitting: Dermatology

## 2023-11-04 DIAGNOSIS — L570 Actinic keratosis: Secondary | ICD-10-CM | POA: Diagnosis not present

## 2023-11-04 DIAGNOSIS — Z7189 Other specified counseling: Secondary | ICD-10-CM

## 2023-11-04 DIAGNOSIS — L578 Other skin changes due to chronic exposure to nonionizing radiation: Secondary | ICD-10-CM

## 2023-11-04 DIAGNOSIS — Z1283 Encounter for screening for malignant neoplasm of skin: Secondary | ICD-10-CM | POA: Diagnosis not present

## 2023-11-04 DIAGNOSIS — Z85828 Personal history of other malignant neoplasm of skin: Secondary | ICD-10-CM

## 2023-11-04 DIAGNOSIS — D1801 Hemangioma of skin and subcutaneous tissue: Secondary | ICD-10-CM

## 2023-11-04 DIAGNOSIS — L814 Other melanin hyperpigmentation: Secondary | ICD-10-CM | POA: Diagnosis not present

## 2023-11-04 DIAGNOSIS — L821 Other seborrheic keratosis: Secondary | ICD-10-CM

## 2023-11-04 DIAGNOSIS — D229 Melanocytic nevi, unspecified: Secondary | ICD-10-CM

## 2023-11-04 DIAGNOSIS — D239 Other benign neoplasm of skin, unspecified: Secondary | ICD-10-CM

## 2023-11-04 DIAGNOSIS — W908XXA Exposure to other nonionizing radiation, initial encounter: Secondary | ICD-10-CM

## 2023-11-04 DIAGNOSIS — D2371 Other benign neoplasm of skin of right lower limb, including hip: Secondary | ICD-10-CM

## 2023-11-04 DIAGNOSIS — Z79899 Other long term (current) drug therapy: Secondary | ICD-10-CM

## 2023-11-04 NOTE — Progress Notes (Signed)
 Follow-Up Visit   Subjective  Matthew Webster is a 73 y.o. male who presents for the following: Skin Cancer Screening and Upper Body Skin Exam. Hx of BCC. Hx of AKs.   The patient presents for Upper Body Skin Exam (UBSE) for skin cancer screening and mole check. The patient has spots, moles and lesions to be evaluated, some may be new or changing and the patient may have concern these could be cancer.    The following portions of the chart were reviewed this encounter and updated as appropriate: medications, allergies, medical history  Review of Systems:  No other skin or systemic complaints except as noted in HPI or Assessment and Plan.  Objective  Well appearing patient in no apparent distress; mood and affect are within normal limits.  All skin waist up examined. Relevant physical exam findings are noted in the Assessment and Plan.  Right Lateral Cheek x2 (hypertrophic), R upper temple x2, L inf vertex scalp x1, crown scalp  x6, L temple x2, R frontal scalp x1 (14) Erythematous thin papules/macules with gritty scale.   Assessment & Plan   AK (ACTINIC KERATOSIS) (14) Right Lateral Cheek x2 (hypertrophic), R upper temple x2, L inf vertex scalp x1, crown scalp  x6, L temple x2, R frontal scalp x1 (14) Actinic keratoses are precancerous spots that appear secondary to cumulative UV radiation exposure/sun exposure over time. They are chronic with expected duration over 1 year. A portion of actinic keratoses will progress to squamous cell carcinoma of the skin. It is not possible to reliably predict which spots will progress to skin cancer and so treatment is recommended to prevent development of skin cancer.  Recommend daily broad spectrum sunscreen SPF 30+ to sun-exposed areas, reapply every 2 hours as needed.  Recommend staying in the shade or wearing long sleeves, sun glasses (UVA+UVB protection) and wide brim hats (4-inch brim around the entire circumference of the hat). Call for new  or changing lesions. Destruction of lesion - Right Lateral Cheek x2 (hypertrophic), R upper temple x2, L inf vertex scalp x1, crown scalp  x6, L temple x2, R frontal scalp x1 (14)  Destruction method: cryotherapy   Informed consent: discussed and consent obtained   Lesion destroyed using liquid nitrogen: Yes   Region frozen until ice ball extended beyond lesion: Yes   Outcome: patient tolerated procedure well with no complications   Post-procedure details: wound care instructions given   Additional details:  Prior to procedure, discussed risks of blister formation, small wound, skin dyspigmentation, or rare scar following cryotherapy. Recommend Vaseline ointment to treated areas while healing.   Skin cancer screening performed today.  HISTORY OF BASAL CELL CARCINOMA OF THE SKIN - No evidence of recurrence today - Recommend regular full body skin exams - Recommend daily broad spectrum sunscreen SPF 30+ to sun-exposed areas, reapply every 2 hours as needed.  - Call if any new or changing lesions are noted between office visits   ACTINIC DAMAGE WITH PRECANCEROUS ACTINIC KERATOSES Counseling for Topical Chemotherapy Management: Patient exhibits: - Severe, confluent actinic changes with pre-cancerous actinic keratoses that is secondary to cumulative UV radiation exposure over time - Condition that is severe; chronic, not at goal. - diffuse scaly erythematous macules and papules with underlying dyspigmentation - Discussed Prescription Field Treatment topical Chemotherapy for Severe, Chronic Confluent Actinic Changes with Pre-Cancerous Actinic Keratoses Field treatment involves treatment of an entire area of skin that has confluent Actinic Changes (Sun/ Ultraviolet light damage) and PreCancerous Actinic Keratoses  by method of PhotoDynamic Therapy (PDT) and/or prescription Topical Chemotherapy agents such as 5-fluorouracil , 5-fluorouracil /calcipotriene , and/or imiquimod.  The purpose is to  decrease the number of clinically evident and subclinical PreCancerous lesions to prevent progression to development of skin cancer by chemically destroying early precancer changes that may or may not be visible.  It has been shown to reduce the risk of developing skin cancer in the treated area. As a result of treatment, redness, scaling, crusting, and open sores may occur during treatment course. One or more than one of these methods may be used and may have to be used several times to control, suppress and eliminate the PreCancerous changes. Discussed treatment course, expected reaction, and possible side effects. - Recommend daily broad spectrum sunscreen SPF 30+ to sun-exposed areas, reapply every 2 hours as needed.  - Staying in the shade or wearing long sleeves, sun glasses (UVA+UVB protection) and wide brim hats (4-inch brim around the entire circumference of the hat) are also recommended. - Call for new or changing lesions. Begin once areas treated today have healed - Start 5-fluorouracil /calcipotriene  cream twice a day for 5 days to affected areas including temples, apply twice a day for 7-10 days to scalp. Pt has Rx.  5-fluorouracil /calcipotriene  cream is is a type of field treatment used to treat precancers, thin skin cancers, and areas of sun damage. Expected reaction includes irritation and mild inflammation potentially progressing to more severe inflammation including redness, scaling, crusting and open sores/erosions.  If too much irritation occurs, ensure application of only a thin layer and decrease frequency of use to achieve a tolerable level of inflammation. Recommend applying Vaseline ointment to open sores as needed.  Minimize sun exposure while under treatment. Recommend daily broad spectrum sunscreen SPF 30+ to sun-exposed areas, reapply every 2 hours as needed.      Lentigines, Seborrheic Keratoses, Hemangiomas - Benign normal skin lesions - Benign-appearing - Call for any  changes  Melanocytic Nevi - Tan-brown and/or pink-flesh-colored symmetric macules and papules - Benign appearing on exam today - Observation - Call clinic for new or changing moles - Recommend daily use of broad spectrum spf 30+ sunscreen to sun-exposed areas.   DERMATOFIBROMA Exam: Firm pink/brown papulenodule with dimple sign at R pretibial.  Treatment Plan: A dermatofibroma is a benign growth possibly related to trauma, such as an insect bite, cut from shaving, or inflamed acne-type bump.  Treatment options to remove include shave or excision with resulting scar and risk of recurrence.  Since benign-appearing and not bothersome, will observe for now.    Return in about 6 months (around 05/04/2024) for AK Follow Up.  I, Jill Parcell, CMA, am acting as scribe for Rexene Rattler, MD.   Documentation: I have reviewed the above documentation for accuracy and completeness, and I agree with the above.  Rexene Rattler, MD

## 2023-11-04 NOTE — Patient Instructions (Addendum)
 Cryotherapy Aftercare  Wash gently with soap and water everyday.   Apply Vaseline daily until healed.   Recommend daily broad spectrum sunscreen SPF 30+ to sun-exposed areas, reapply every 2 hours as needed. Call for new or changing lesions.  Staying in the shade or wearing long sleeves, sun glasses (UVA+UVB protection) and wide brim hats (4-inch brim around the entire circumference of the hat) are also recommended for sun protection.     Due to recent changes in healthcare laws, you may see results of your pathology and/or laboratory studies on MyChart before the doctors have had a chance to review them. We understand that in some cases there may be results that are confusing or concerning to you. Please understand that not all results are received at the same time and often the doctors may need to interpret multiple results in order to provide you with the best plan of care or course of treatment. Therefore, we ask that you please give us  2 business days to thoroughly review all your results before contacting the office for clarification. Should we see a critical lab result, you will be contacted sooner.   If You Need Anything After Your Visit  If you have any questions or concerns for your doctor, please call our main line at [phone] and press option 4 to reach your doctor's medical assistant. If no one answers, please leave a voicemail as directed and we will return your call as soon as possible. Messages left after 4 pm will be answered the following business day.   You may also send us  a message via MyChart. We typically respond to MyChart messages within 1-2 business days.  For prescription refills, please ask your pharmacy to contact our office. Our fax number is [phone].  If you have an urgent issue when the clinic is closed that cannot wait until the next business day, you can page your doctor at the number below.    Please note that while we do our best to be available for  urgent issues outside of office hours, we are not available 24/7.   If you have an urgent issue and are unable to reach us , you may choose to seek medical care at your doctor's office, retail clinic, urgent care center, or emergency room.  If you have a medical emergency, please immediately call 911 or go to the emergency department.  Pager Numbers  - Dr. Hester: [phone]  - Dr. Jackquline: [phone]  - Dr. Claudene: [phone]   - Dr. Raymund: [phone]  In the event of inclement weather, please call our main line at [phone] for an update on the status of any delays or closures.  Dermatology Medication Tips: Please keep the boxes that topical medications come in in order to help keep track of the instructions about where and how to use these. Pharmacies typically print the medication instructions only on the boxes and not directly on the medication tubes.   If your medication is too expensive, please contact our office at [phone] option 4 or send us  a message through MyChart.   We are unable to tell what your co-pay for medications will be in advance as this is different depending on your insurance coverage. However, we may be able to find a substitute medication at lower cost or fill out paperwork to get insurance to cover a needed medication.   If a prior authorization is required to get your medication covered by your insurance company, please allow us  1-2 business days to  complete this process.  Drug prices often vary depending on where the prescription is filled and some pharmacies may offer cheaper prices.  The website www.goodrx.com contains coupons for medications through different pharmacies. The prices here do not account for what the cost may be with help from insurance (it may be cheaper with your insurance), but the website can give you the price if you did not use any insurance.  - You can print the associated coupon and take it with your prescription to  the pharmacy.  - You may also stop by our office during regular business hours and pick up a GoodRx coupon card.  - If you need your prescription sent electronically to a different pharmacy, notify our office through [facility] or by phone at [phone] option 4.     Si Usted Necesita Algo Despus de Su Visita  Tambin puede enviarnos un mensaje a travs de Clinical cytogeneticist. Por lo general respondemos a los mensajes de MyChart en el transcurso de 1 a 2 das hbiles.  Para renovar recetas, por favor pida a su farmacia que se ponga en contacto con nuestra oficina. Randi lakes de fax es [place] [phone].  Si tiene un asunto urgente cuando la clnica est cerrada y que no puede esperar hasta el siguiente da hbil, puede llamar/localizar a su doctor(a) al nmero que aparece a continuacin.   Por favor, tenga en cuenta que aunque hacemos todo lo posible para estar disponibles para asuntos urgentes fuera del horario de [place], no estamos disponibles las 24 horas del da, los 7 [address]  [address] de la [place].   Si tiene un problema urgente y no puede comunicarse con nosotros, puede optar por buscar atencin mdica  en el consultorio de su doctor(a), en una clnica privada, en un centro de atencin urgente o en una sala de emergencias.  Si tiene Engineer, drilling, por favor llame inmediatamente al 911 o vaya a la sala de emergencias.  Nmeros de bper  - Dr. Hester: [phone]  - Dra. Jackquline: [phone]  - Dr. Claudene: [phone]  - Dra. Kitts: [phone]  En caso de inclemencias del [place], por favor llame a nuestra lnea principal al [phone] para una actualizacin sobre el estado de cualquier retraso o cierre.  Consejos para la medicacin en dermatologa: Por favor, guarde las cajas en las que vienen los medicamentos de uso tpico para ayudarle a seguir las instrucciones sobre dnde y cmo usarlos. Las farmacias generalmente imprimen las instrucciones del medicamento slo en las  cajas y no directamente en los tubos del [place].   Si su medicamento es muy caro, por favor, pngase en contacto con landry rieger llamando al [phone] y presione la opcin 4 o envenos un mensaje a travs de Clinical cytogeneticist.   No podemos decirle cul ser su copago por los medicamentos por adelantado ya que esto es diferente dependiendo de la cobertura de su seguro. Sin embargo, es posible que podamos encontrar un medicamento sustituto a Audiological scientist un formulario para que el seguro cubra el medicamento que se considera necesario.   Si se requiere una autorizacin previa para que su compaa de seguros malta su medicamento, por favor permtanos de 1 a 2 das hbiles para completar este proceso.  Los precios de los medicamentos varan con frecuencia dependiendo del Environmental consultant de dnde se surte la receta y alguna farmacias pueden ofrecer precios ms baratos.  El sitio web www.goodrx.com tiene cupones para medicamentos de Health and safety inspector. Los precios aqu no tienen en cuenta lo que podra  costar con la ayuda del seguro (puede ser ms barato con su seguro), pero el sitio web puede darle el precio si no Visual merchandiser.  - Puede imprimir el cupn correspondiente y llevarlo con su receta a la farmacia.  - Tambin puede pasar por nuestra oficina durante el horario de atencin regular y Education officer, museum una tarjeta de cupones de GoodRx.  - Si necesita que su receta se enve electrnicamente a una farmacia diferente, informe a nuestra oficina a travs de MyChart de [facility] o por telfono llamando al [phone] y presione la opcin 4.

## 2023-11-10 ENCOUNTER — Other Ambulatory Visit

## 2023-11-10 DIAGNOSIS — R972 Elevated prostate specific antigen [PSA]: Secondary | ICD-10-CM

## 2023-11-11 ENCOUNTER — Ambulatory Visit: Payer: Self-pay | Admitting: Urology

## 2023-11-11 DIAGNOSIS — R972 Elevated prostate specific antigen [PSA]: Secondary | ICD-10-CM

## 2023-11-11 DIAGNOSIS — N401 Enlarged prostate with lower urinary tract symptoms: Secondary | ICD-10-CM

## 2023-11-11 LAB — PSA, TOTAL AND FREE
PSA, Free Pct: 16.6 %
PSA, Free: 1.03 ng/mL
Prostate Specific Ag, Serum: 6.2 ng/mL — ABNORMAL HIGH (ref 0.0–4.0)

## 2023-11-12 NOTE — Progress Notes (Signed)
 Patient under stood and appt was made

## 2023-11-14 ENCOUNTER — Encounter: Payer: Self-pay | Admitting: Nurse Practitioner

## 2023-11-14 DIAGNOSIS — K7030 Alcoholic cirrhosis of liver without ascites: Secondary | ICD-10-CM | POA: Insufficient documentation

## 2023-11-14 DIAGNOSIS — R972 Elevated prostate specific antigen [PSA]: Secondary | ICD-10-CM | POA: Insufficient documentation

## 2023-11-17 ENCOUNTER — Encounter: Payer: Self-pay | Admitting: Urology

## 2023-11-17 ENCOUNTER — Telehealth: Payer: Self-pay | Admitting: Nurse Practitioner

## 2023-11-17 ENCOUNTER — Other Ambulatory Visit: Payer: Self-pay | Admitting: Nurse Practitioner

## 2023-11-17 NOTE — Telephone Encounter (Addendum)
 Lvm to return my call regarding u/s & additional labs-Toni  S/w patient, he will go get labs done. Gave him telephone # [phone] to get his u/s scheduled with [facility]-Toni

## 2023-11-20 ENCOUNTER — Other Ambulatory Visit

## 2023-11-21 ENCOUNTER — Other Ambulatory Visit

## 2023-11-28 ENCOUNTER — Ambulatory Visit
Admission: RE | Admit: 2023-11-28 | Discharge: 2023-11-28 | Disposition: A | Discharge status: Home / Self Care | Source: Ambulatory Visit | Attending: Urology | Admitting: Urology

## 2023-11-28 DIAGNOSIS — R972 Elevated prostate specific antigen [PSA]: Secondary | ICD-10-CM

## 2023-11-28 DIAGNOSIS — N138 Other obstructive and reflux uropathy: Secondary | ICD-10-CM

## 2023-11-28 MED ORDER — GADOPICLENOL 0.5 MMOL/ML IV SOLN
10.0000 mL | Freq: Once | INTRAVENOUS | Status: AC | PRN
Start: 2023-11-28 — End: 2023-11-28
  Administered 2023-11-28: 10 mL via INTRAVENOUS

## 2023-12-02 ENCOUNTER — Inpatient Hospital Stay: Admission: RE | Admit: 2023-12-02 | Source: Ambulatory Visit

## 2023-12-05 NOTE — Progress Notes (Signed)
 Matthew Webster                                          MRN: [id number]   12/05/2023   The [facility] Quality Team Specialist reviewed this patient medical record for the purposes of chart review for care gap closure. The following were reviewed: abstraction for care gap closure-controlling blood pressure.    [facility]I Quality Team

## 2023-12-07 NOTE — Progress Notes (Unsigned)
 12/09/2023 4:07 PM   Matthew Webster 1950/04/12 [id number]  Referring provider: Liana Fish, NP [address] [place],  [place] [address]  Urological history: 1. Elevated PSA - PSA (2017) 7.9 - negative bx (2015)  - prostate MRI (2017) PI-RADS 2 lesion  2. 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 man who presents today for prostate MRI results.  Previous records reviewed.   His PSA had risen from 4.4 to 6.2.    He underwent a prostate MRI on 10/31 which did not identify any concerning lesions for clinically significant prostate cancer.   PSAD 0.13.    Patient denies any modifying or aggravating factors.  Patient denies any recent UTI's, gross hematuria, dysuria or suprapubic/flank pain.  Patient denies any fevers, chills, nausea or vomiting.     PMH: Past Medical History:  Diagnosis Date   Actinic keratosis    Anxiety    Benign non-nodular prostatic hyperplasia with lower urinary tract symptoms    Cancer of sigmoid colon (HCC) 06/20/2014   Depression 11/24/2013   Overview:  Questionable bipolar   Detached retina, left 2010   Diverticulosis    Fatty liver    on CT   Hemorrhoids    History of chicken pox    Hx of basal cell carcinoma 12/24/2016   Left anterior ear helix   Hx of basal cell carcinoma 06/24/2017   Left upper forehead   Hx of basal cell carcinoma 08/14/2017   Right upper forehead   Hypertension 11/24/2013   Personal history of colonic polyps 02/24/2014   Recurrent syncope     Surgical History: Past Surgical History:  Procedure Laterality Date   CATARACT EXTRACTION W/ INTRAOCULAR LENS IMPLANT Left 2011   CATARACT EXTRACTION W/PHACO Right 05/26/2019   Procedure: CATARACT EXTRACTION PHACO AND INTRAOCULAR LENS PLACEMENT (IOC) RIGHT 10.64  01:10.2  15.1%;  Surgeon: Mittie Gaskin, MD;  Location: [facility];  Service:  Ophthalmology;  Laterality: Right;   COLONOSCOPY  2016   COLONOSCOPY WITH PROPOFOL  N/A 07/22/2017   Procedure: COLONOSCOPY WITH PROPOFOL ;  Surgeon: Gaylyn Gladis PENNER, MD;  Location[facility];  Service: Endoscopy;  Laterality: N/A;   COLONOSCOPY WITH PROPOFOL  N/A 07/21/2019   Procedure: COLONOSCOPY WITH PROPOFOL ;  Surgeon: Toledo, Ladell POUR, MD;  Location: [facility] ENDOSCOPY;  Service: Gastroenterology;  Laterality: N/A;   CYSTOSCOPY WITH INSERTION OF UROLIFT N/A 07/08/2022   Procedure: CYSTOSCOPY WITH INSERTION OF UROLIFT;  Surgeon: Penne Knee, MD;  Location: [facility] ORS;  Service: Urology;  Laterality: N/A;   DIAGNOSTIC LAPAROSCOPY     ELBOW FRACTURE SURGERY Left    screws and plate   ESOPHAGOGASTRODUODENOSCOPY N/A 07/21/2019   Procedure: ESOPHAGOGASTRODUODENOSCOPY (EGD);  Surgeon: Toledo, Ladell POUR, MD;  Location: [facility] ENDOSCOPY;  Service: Gastroenterology;  Laterality: N/A;   LAPAROSCOPIC BOWEL RESECTION     8 inches colon removed with polyps   RETINAL DETACHMENT SURGERY Left 10/24/2008   TONSILLECTOMY     WISDOM TOOTH EXTRACTION      Home Medications:  Allergies as of 12/09/2023       Reactions   Alphagan P [brimonidine Tartrate] Swelling   Eye redness, swelling, and discharge   Codeine    constipation   Shellfish Allergy Diarrhea, Nausea And Vomiting   Raw clams   Penicillin V Potassium Rash   Penicillins Rash  Medication List        Accurate as of December 09, 2023  4:07 PM. If you have any questions, ask your nurse or doctor.          latanoprost  0.005 % ophthalmic solution Commonly known as: XALATAN  Place 1 drop into both eyes at bedtime.   losartan -hydrochlorothiazide  50-12.5 MG tablet Commonly known as: HYZAAR Take 1 tablet by mouth daily.   sildenafil  20 MG tablet Commonly known as: REVATIO  Take 1 tablet (20 mg total) by mouth as needed.   tamsulosin  0.4 MG Caps capsule Commonly known as: FLOMAX  Take 1 capsule (0.4 mg total) by mouth  daily. 30 minutes after the same meal   timolol  0.5 % ophthalmic solution Commonly known as: TIMOPTIC  instill 1 drop into left eye every morning   venlafaxine  XR 150 MG 24 hr capsule Commonly known as: EFFEXOR -XR Take 1 capsule (150 mg total) by mouth at bedtime.        Allergies:  Allergies  Allergen Reactions   Alphagan P [Brimonidine Tartrate] Swelling    Eye redness, swelling, and discharge   Codeine     constipation   Shellfish Allergy Diarrhea and Nausea And Vomiting    Raw clams   Penicillin V Potassium Rash   Penicillins Rash    Family History: Family History  Problem Relation Age of Onset   CVA Mother    Diabetes Father    Prostate cancer Neg Hx    Bladder Cancer Neg Hx    Kidney cancer Neg Hx     Social History: See HPI for pertinent social history  ROS: Pertinent ROS in HPI  Physical Exam: Blood pressure 137/78, pulse 62, height 5' 6 (1.676 m), weight 210 lb (95.3 kg).  Constitutional:  Well nourished. Alert and oriented, No acute distress. HEENT: [place] AT, moist mucus membranes.  Trachea midline Cardiovascular: No clubbing, cyanosis, or edema. Respiratory: Normal respiratory effort, no increased work of breathing. Neurologic: Grossly intact, no focal deficits, moving all 4 extremities. Psychiatric: Normal mood and affect.   Laboratory Data: See EPIC and HPI  I have reviewed the labs.   Pertinent Imaging: EXAM: MRI PROSTATE WITH AND WITHOUT INTRAVENOUS CONTRAST 11/28/2023 01:50:00 PM   TECHNIQUE: Multiparametric MRI imaging of the prostate with and without dynamic contrast enhanced imaging and diffusion weighted imaging was performed.   Contrast: 10 mL of Vueway .   Dynacad was utilized in analysis of images.   COMPARISON: MR Prostate without and with IV Contrast 11/09/2015.   CLINICAL HISTORY: Prostate cancer suspected, Elevated PSA, Benign prostatic hyperplasia with urinary obstruction.   FINDINGS:   PROSTATE: Metal artifact  in the prostate gland in a pattern suggesting possible UroLift. Encapsulated nodularity in the transition zone compatible with benign prostatic hypertrophy. Hazy nonfocal low T2 signal in the peripheral zone is likely postinflammatory and is considered PI-RADS category 2. Prostate volume by 3d volumetric analysis: 48.6 cm3 (4.7 x 4.3 x 4.8 cm). No significant abnormal findings of intermediate or higher suspicion for prostate cancer is identified.   SEMINAL VESICLES: Unremarkable.   NEUROVASCULAR BUNDLE: Unremarkable.   LYMPHADENOPATHY: No lymphadenopathy.   BLADDER: The bladder is unremarkable.   BOWEL: The visualized bowel is without acute abnormality.   PERITONEAL CAVITY: No free fluid.   BONES: Normal bone marrow signal intensity. No suspicious or aggressive osseous lesions.   SOFT TISSUES: No focal abnormality.   IMPRESSION: 1. No significant abnormal findings of intermediate or higher suspicion for prostate cancer is identified. 2. Hazy nonfocal low T2  signal in the peripheral zone, likely postinflammatory, PI-RADS category 2. 3. Encapsulated nodularity in the transition zone compatible with benign prostatic hypertrophy. 4. Metal artifact in the prostate gland in a pattern suggesting possible UroLift. 5. Mild prostatomegaly.f   Electronically signed by: Ryan Salvage MD 11/29/2023 06:37 PM EDT RP Workstation: [id number]    I have independently reviewed the films.  See HPI.    Assessment & Plan:    1. Elevated PSA - Prostate MRI did not find any clinically significant lesions - repeat PSA in 6 months  2. BPH with LU TS - Continue tamsulosin  0.4 mg daily   Return in about 6 months (around 06/07/2024) for Prostate health index, I PSS, SHIM .  These notes generated with voice recognition software. I apologize for typographical errors.  Matthew Webster  [facility] [address]  [address] [place], [place]  [address] [phone]

## 2023-12-09 ENCOUNTER — Ambulatory Visit: Admitting: Urology

## 2023-12-09 ENCOUNTER — Other Ambulatory Visit: Payer: Self-pay

## 2023-12-09 ENCOUNTER — Encounter: Payer: Self-pay | Admitting: Urology

## 2023-12-09 VITALS — BP 137/78 | HR 62 | Ht 66.0 in | Wt 210.0 lb

## 2023-12-09 DIAGNOSIS — N5201 Erectile dysfunction due to arterial insufficiency: Secondary | ICD-10-CM | POA: Diagnosis not present

## 2023-12-09 DIAGNOSIS — N138 Other obstructive and reflux uropathy: Secondary | ICD-10-CM | POA: Diagnosis not present

## 2023-12-09 DIAGNOSIS — N401 Enlarged prostate with lower urinary tract symptoms: Secondary | ICD-10-CM

## 2023-12-09 DIAGNOSIS — R972 Elevated prostate specific antigen [PSA]: Secondary | ICD-10-CM

## 2023-12-23 ENCOUNTER — Ambulatory Visit
Admission: RE | Admit: 2023-12-23 | Discharge: 2023-12-23 | Disposition: A | Source: Ambulatory Visit | Attending: Nurse Practitioner

## 2023-12-23 DIAGNOSIS — K702 Alcoholic fibrosis and sclerosis of liver: Secondary | ICD-10-CM

## 2023-12-23 DIAGNOSIS — E1169 Type 2 diabetes mellitus with other specified complication: Secondary | ICD-10-CM

## 2023-12-23 DIAGNOSIS — F1020 Alcohol dependence, uncomplicated: Secondary | ICD-10-CM

## 2023-12-23 DIAGNOSIS — I152 Hypertension secondary to endocrine disorders: Secondary | ICD-10-CM

## 2024-02-03 ENCOUNTER — Telehealth: Payer: Self-pay

## 2024-02-04 ENCOUNTER — Telehealth: Payer: Self-pay | Admitting: Nurse Practitioner

## 2024-02-04 NOTE — Telephone Encounter (Signed)
 Lvm to schedule virtual appointment for today-Matthew Webster

## 2024-02-04 NOTE — Telephone Encounter (Signed)
 Lmom that we make him virtual appt this  friday

## 2024-02-05 ENCOUNTER — Telehealth: Admitting: Nurse Practitioner

## 2024-02-05 ENCOUNTER — Encounter: Payer: Self-pay | Admitting: Nurse Practitioner

## 2024-02-05 VITALS — Resp 16 | Ht 66.0 in

## 2024-02-05 DIAGNOSIS — K7030 Alcoholic cirrhosis of liver without ascites: Secondary | ICD-10-CM | POA: Diagnosis not present

## 2024-02-06 ENCOUNTER — Telehealth: Payer: Self-pay

## 2024-02-06 ENCOUNTER — Encounter: Payer: Self-pay | Admitting: Nurse Practitioner

## 2024-02-06 NOTE — Telephone Encounter (Signed)
 Left message for patient to give office a call back.

## 2024-02-06 NOTE — Progress Notes (Signed)
 [facility] [address] [place], [place] [address]  Internal MEDICINE  Telephone Visit  Patient Name: Matthew Webster  [id number]  [id number]  Date of Service: 02/06/2024  I connected with the patient at 1200 by telephone and verified the patients identity using two identifiers.   I discussed the limitations, risks, security and privacy concerns of performing an evaluation and management service by telephone and the availability of in person appointments. I also discussed with the patient that there may be a patient responsible charge related to the service.  The patient expressed understanding and agrees to proceed.    Chief Complaint  Patient presents with   Telephone Screen    Review U/S   Telephone Assessment    HPI Matthew Webster presents for a telehealth virtual visit for the results of his fibroscan ultrasound. The fibroscan results show fibrosis that is significant with morphology of cirrhosis with portal hypertension as well.  Discussed referral to a hepatologist which is is agreeable to.  Reminded patient to get his labs drawn asap.    Current Medication: Outpatient Encounter Medications as of 02/05/2024  Medication Sig   latanoprost  (XALATAN ) 0.005 % ophthalmic solution Place 1 drop into both eyes at bedtime.   losartan -hydrochlorothiazide  (HYZAAR) 50-12.5 MG tablet Take 1 tablet by mouth daily.   sildenafil  (REVATIO ) 20 MG tablet Take 1 tablet (20 mg total) by mouth as needed.   tamsulosin  (FLOMAX ) 0.4 MG CAPS capsule Take 1 capsule (0.4 mg total) by mouth daily. 30 minutes after the same meal   timolol  (TIMOPTIC ) 0.5 % ophthalmic solution instill 1 drop into left eye every morning   venlafaxine  XR (EFFEXOR -XR) 150 MG 24 hr capsule Take 1 capsule (150 mg total) by mouth at bedtime.   No facility-administered encounter medications on file as of 02/05/2024.    Surgical History: Past Surgical History:  Procedure Laterality Date   CATARACT EXTRACTION W/ INTRAOCULAR  LENS IMPLANT Left 2011   CATARACT EXTRACTION W/PHACO Right 05/26/2019   Procedure: CATARACT EXTRACTION PHACO AND INTRAOCULAR LENS PLACEMENT (IOC) RIGHT 10.64  01:10.2  15.1%;  Surgeon: Mittie Gaskin, MD;  Location: [facility];  Service: Ophthalmology;  Laterality: Right;   COLONOSCOPY  2016   COLONOSCOPY WITH PROPOFOL  N/A 07/22/2017   Procedure: COLONOSCOPY WITH PROPOFOL ;  Surgeon: Gaylyn Gladis PENNER, MD;  Location: [facility];  Service: Endoscopy;  Laterality: N/A;   COLONOSCOPY WITH PROPOFOL  N/A 07/21/2019   Procedure: COLONOSCOPY WITH PROPOFOL ;  Surgeon: Toledo, Ladell POUR, MD;  Location: [facility] ENDOSCOPY;  Service: Gastroenterology;  Laterality: N/A;   CYSTOSCOPY WITH INSERTION OF UROLIFT N/A 07/08/2022   Procedure: CYSTOSCOPY WITH INSERTION OF UROLIFT;  Surgeon: Penne Knee, MD;  Location: [facility] ORS;  Service: Urology;  Laterality: N/A;   DIAGNOSTIC LAPAROSCOPY     ELBOW FRACTURE SURGERY Left    screws and plate   ESOPHAGOGASTRODUODENOSCOPY N/A 07/21/2019   Procedure: ESOPHAGOGASTRODUODENOSCOPY (EGD);  Surgeon: Toledo, Ladell POUR, MD;  Location: [facility] ENDOSCOPY;  Service: Gastroenterology;  Laterality: N/A;   LAPAROSCOPIC BOWEL RESECTION     8 inches colon removed with polyps   RETINAL DETACHMENT SURGERY Left 10/24/2008   TONSILLECTOMY     WISDOM TOOTH EXTRACTION      Medical History: Past Medical History:  Diagnosis Date   Actinic keratosis    Anxiety    Benign non-nodular prostatic hyperplasia with lower urinary tract symptoms    Cancer of sigmoid colon (HCC) 06/20/2014   Depression 11/24/2013   Overview:  Questionable bipolar   Detached retina,  left 2010   Diverticulosis    Fatty liver    on CT   Hemorrhoids    History of chicken pox    Hx of basal cell carcinoma 12/24/2016   Left anterior ear helix   Hx of basal cell carcinoma 06/24/2017   Left upper forehead   Hx of basal cell carcinoma 08/14/2017   Right upper forehead   Hypertension 11/24/2013    Personal history of colonic polyps 02/24/2014   Recurrent syncope     Family History: Family History  Problem Relation Age of Onset   CVA Mother    Diabetes Father    Prostate cancer Neg Hx    Bladder Cancer Neg Hx    Kidney cancer Neg Hx     Social History   Socioeconomic History   Marital status: Married    Spouse name: Rojelio   Number of children: 1   Years of education: Not on file   Highest education level: Not on file  Occupational History   Not on file  Tobacco Use   Smoking status: Never   Smokeless tobacco: Never  Vaping Use   Vaping status: Never Used  Substance and Sexual Activity   Alcohol use: Yes    Alcohol/week: 8.0 standard drinks of alcohol    Types: 8 Cans of beer per week    Comment: 8 every night/4 maybe   Drug use: No   Sexual activity: Yes  Other Topics Concern   Not on file  Social History Narrative   Not on file   Social Drivers of Health   Tobacco Use: Low Risk (02/05/2024)   Patient History    Smoking Tobacco Use: Never    Smokeless Tobacco Use: Never    Passive Exposure: Not on file  Financial Resource Strain: Low Risk  (10/03/2023)   Received [facility]   Overall Financial Resource Strain (CARDIA)    Difficulty of Paying Living Expenses: Not hard at all  Food Insecurity: No Food Insecurity (10/03/2023)   Received from [facility]   Epic    Within the past 12 months, you worried that your food would run out before you got the money to buy more.: Never true    Within the past 12 months, the food you bought just didn't last and you didn't have money to get more.: Never true  Transportation Needs: No Transportation Needs (10/03/2023)   Received from [facility] - Transportation    In the past 12 months, has lack of transportation kept you from medical appointments or from getting medications?: No    Lack of Transportation (Non-Medical): No  Physical Activity: Not on  file  Stress: Not on file  Social Connections: Not on file  Intimate Partner Violence: Not on file  Depression (PHQ2-9): Low Risk (07/07/2023)   Depression (PHQ2-9)    PHQ-2 Score: 0  Alcohol Screen: High Risk (10/06/2023)   Alcohol Screen    Last Alcohol Screening Score (AUDIT): 20  Housing: Low Risk  (10/03/2023)   Received from [facility]   Epic    In the last 12 months, was there a time when you were not able to pay the mortgage or rent on time?: No    In the past 12 months, how many times have you moved where you were living?: 0    At any time in the past 12 months, were you homeless or living in a shelter (including  now)?: No  Utilities: Not At Risk (10/03/2023)   Received from [facility]   Epic    In the past 12 months has the electric, gas, oil, or water company threatened to shut off services in your home?: No  Health Literacy: Not on file      Review of Systems  Constitutional:  Positive for fatigue. Negative for activity change, appetite change, chills, fever and unexpected weight change.  HENT: Negative.  Negative for congestion, ear pain, rhinorrhea, sore throat and trouble swallowing.   Eyes: Negative.   Respiratory: Negative.  Negative for cough, chest tightness, shortness of breath and wheezing.   Cardiovascular: Negative.  Negative for chest pain.  Gastrointestinal: Negative.  Negative for abdominal pain, blood in stool, constipation, diarrhea, nausea and vomiting.  Endocrine: Negative.   Genitourinary: Negative.  Negative for difficulty urinating, dysuria, frequency, hematuria and urgency.  Musculoskeletal: Negative.  Negative for arthralgias, back pain, joint swelling, myalgias and neck pain.  Skin: Negative.  Negative for rash and wound.  Allergic/Immunologic: Negative.  Negative for immunocompromised state.  Neurological: Negative.  Negative for dizziness, seizures, numbness and headaches.  Hematological: Negative.    Psychiatric/Behavioral: Negative.  Negative for behavioral problems, self-injury and suicidal ideas. The patient is not nervous/anxious.     Vital Signs: Resp 16   Ht 5' 6 (1.676 m)   BMI 33.89 kg/m    Observation/Objective: He is alert and oriented. No acute distress noted.     Assessment/Plan: 1. Alcoholic cirrhosis of liver without ascites (HCC) (Primary) Referred to Dr. Unk at [facility] clinic GI for liver disease.  - Amb Referral to Hepatology   General Counseling: Carlin oakland understanding of the findings of today's phone visit and agrees with plan of treatment. I have discussed any further diagnostic evaluation that may be needed or ordered today. We also reviewed his medications today. he has been encouraged to call the office with any questions or concerns that should arise related to todays visit.  Return for referred to GI/hepatology at [facility] clinic. follow up at next sch appt. .   Orders Placed This Encounter  Procedures   Amb Referral to Hepatology    No orders of the defined types were placed in this encounter.   Time spent:20 Minutes Time spent with patient included reviewing progress notes, labs, imaging studies, and discussing plan for follow up.  [place] Controlled Substance Database was reviewed by me for overdose risk score (ORS) if appropriate.  This patient was seen by Mardy Maxin, FNP-C in collaboration with Dr. Sigrid Bathe as a part of collaborative care agreement.  Marrissa Dai R. Maxin, MSN, FNP-C Internal medicine

## 2024-02-11 ENCOUNTER — Telehealth: Payer: Self-pay | Admitting: Nurse Practitioner

## 2024-02-11 ENCOUNTER — Other Ambulatory Visit: Payer: Self-pay | Admitting: Podiatry

## 2024-02-11 NOTE — Telephone Encounter (Signed)
 Urgent Hepatology referral sent via [employer] to Dr. Unk w/ [facility]. Notified patient. Gave patient telephone # [phone]

## 2024-02-12 ENCOUNTER — Telehealth: Payer: Self-pay | Admitting: Nurse Practitioner

## 2024-02-12 NOTE — Telephone Encounter (Signed)
 Received medical clearance from [facility]. Gave to Alyssa-Toni

## 2024-02-18 ENCOUNTER — Telehealth: Payer: Self-pay | Admitting: Nurse Practitioner

## 2024-02-18 ENCOUNTER — Other Ambulatory Visit: Payer: Self-pay | Admitting: Podiatry

## 2024-02-18 NOTE — Telephone Encounter (Signed)
 Medical clearance completed & faxed back to [facility]; [phone]. Scanned-Toni

## 2024-02-25 ENCOUNTER — Other Ambulatory Visit: Payer: Self-pay | Admitting: Gastroenterology

## 2024-02-25 DIAGNOSIS — K7030 Alcoholic cirrhosis of liver without ascites: Secondary | ICD-10-CM

## 2024-02-27 ENCOUNTER — Encounter: Payer: Self-pay | Admitting: Podiatry

## 2024-03-03 ENCOUNTER — Encounter: Payer: Self-pay | Admitting: Anesthesiology

## 2024-03-03 ENCOUNTER — Other Ambulatory Visit: Payer: Self-pay

## 2024-03-03 ENCOUNTER — Encounter
Admission: RE | Disposition: A | Discharge status: Home / Self Care | Payer: Self-pay | Source: Home / Self Care | Attending: Podiatry

## 2024-03-03 ENCOUNTER — Ambulatory Visit: Payer: Self-pay

## 2024-03-03 ENCOUNTER — Ambulatory Visit
Admission: RE | Admit: 2024-03-03 | Discharge: 2024-03-03 | Disposition: A | Discharge status: Home / Self Care | Source: Home / Self Care | Attending: Podiatry | Admitting: Podiatry

## 2024-03-03 ENCOUNTER — Encounter: Payer: Self-pay | Admitting: Podiatry

## 2024-03-03 HISTORY — DX: Prediabetes: R73.03

## 2024-03-03 MED ORDER — ONDANSETRON HCL 4 MG/2ML IJ SOLN
INTRAMUSCULAR | Status: DC | PRN
  Administered 2024-03-03: 4 mg via INTRAVENOUS

## 2024-03-03 MED ORDER — BUPIVACAINE LIPOSOME 1.3 % IJ SUSP
INTRAMUSCULAR | Status: AC
  Filled 2024-03-03: qty 10

## 2024-03-03 MED ORDER — BUPIVACAINE LIPOSOME 1.3 % IJ SUSP
INTRAMUSCULAR | Status: DC | PRN
  Administered 2024-03-03: 10 mL

## 2024-03-03 MED ORDER — PHENYLEPHRINE HCL (PRESSORS) 10 MG/ML IV SOLN
INTRAVENOUS | Status: DC | PRN
  Administered 2024-03-03 (×2): 100 ug via INTRAVENOUS

## 2024-03-03 MED ORDER — CEFAZOLIN SODIUM-DEXTROSE 2-4 GM/100ML-% IV SOLN
2.0000 g | INTRAVENOUS | Status: AC
  Administered 2024-03-03: 2 g via INTRAVENOUS

## 2024-03-03 MED ORDER — MIDAZOLAM HCL 2 MG/2ML IJ SOLN
INTRAMUSCULAR | Status: AC
  Filled 2024-03-03: qty 2

## 2024-03-03 MED ORDER — EPHEDRINE SULFATE (PRESSORS) 25 MG/5ML IV SOSY
PREFILLED_SYRINGE | INTRAVENOUS | Status: DC | PRN
  Administered 2024-03-03 (×4): 10 mg via INTRAVENOUS

## 2024-03-03 MED ORDER — DEXAMETHASONE SODIUM PHOSPHATE 4 MG/ML IJ SOLN
INTRAMUSCULAR | Status: DC | PRN
  Administered 2024-03-03: 4 mg via INTRAVENOUS

## 2024-03-03 MED ORDER — LACTATED RINGERS IV SOLN: INTRAVENOUS | Status: DC

## 2024-03-03 MED ORDER — FENTANYL CITRATE (PF) 100 MCG/2ML IJ SOLN
INTRAMUSCULAR | Status: AC
  Filled 2024-03-03: qty 2

## 2024-03-03 MED ORDER — CEFAZOLIN SODIUM-DEXTROSE 2-3 GM-%(50ML) IV SOLR
INTRAVENOUS | Status: AC
  Filled 2024-03-03: qty 50

## 2024-03-03 MED ORDER — BUPIVACAINE HCL 0.25 % IJ SOLN
INTRAMUSCULAR | Status: AC
  Filled 2024-03-03: qty 1

## 2024-03-03 MED ORDER — PROPOFOL 10 MG/ML IV BOLUS
INTRAVENOUS | Status: DC | PRN
  Administered 2024-03-03: 150 mg via INTRAVENOUS

## 2024-03-03 MED ORDER — FENTANYL CITRATE (PF) 100 MCG/2ML IJ SOLN
INTRAMUSCULAR | Status: DC | PRN
  Administered 2024-03-03: 100 ug via INTRAVENOUS

## 2024-03-03 MED ORDER — OXYCODONE-ACETAMINOPHEN 5-325 MG PO TABS: 1.0000 | ORAL_TABLET | Freq: Three times a day (TID) | ORAL | 0 refills | Status: AC | PRN

## 2024-03-03 MED ORDER — BUPIVACAINE HCL 0.25 % IJ SOLN
INTRAMUSCULAR | Status: DC | PRN
  Administered 2024-03-03: 10 mL

## 2024-03-03 MED ORDER — MIDAZOLAM HCL 5 MG/5ML IJ SOLN
INTRAMUSCULAR | Status: DC | PRN
  Administered 2024-03-03: 2 mg via INTRAVENOUS

## 2024-03-03 NOTE — H&P (Signed)
 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  The various methods of treatment have been discussed with the patient.  No guarantees were given.  After consideration of risks, benefits and other options for treatment, the patient has consented to surgery.  I have reviewed the patients chart and labs.     A history and physical examination was performed in my office.  The patient was reexamined.  There have been no changes to this history and physical examination.  Ashley Soulier A

## 2024-03-03 NOTE — Transfer of Care (Signed)
 Immediate Anesthesia Transfer of Care Note  Patient: Matthew Webster  Procedure(s) Performed: EXCISION, METATARSAL BONE, HEAD (Left: Toe) CHEILECTOMY (Left: Foot)  Patient Location: PACU  Anesthesia Type: General  Level of Consciousness: awake, alert  and patient cooperative  Airway and Oxygen Therapy: Patient Spontanous Breathing and Patient connected to supplemental oxygen  Post-op Assessment: Post-op Vital signs reviewed, Patient's Cardiovascular Status Stable, Respiratory Function Stable, Patent Airway and No signs of Nausea or vomiting  Post-op Vital Signs: Reviewed and stable  Complications: No notable events documented.

## 2024-03-03 NOTE — Op Note (Signed)
 Operative note   Surgeon:Marnae Madani [employer]/support/administrative Officer: None    Preop diagnosis: 1.  Hallux rigidus left first MTPJ 2.  Exostosis dorsal left midfoot    Postop diagnosis: Same    Procedure: 1.  Cheilectomy left first MTPJ 2.  Exostectomy dorsal left first metatarsal cuneiform joint 3.  Exostectomy dorsal left second metatarsal cuneiform joint 4.  Intraoperative fluoroscopy without assistance of radiologist    EBL: Minimal    Anesthesia: General With local.  Local consist of a total of 20 cc of a one-to-one mixture of 0.5% plain bupivacaine  and Exparel  long-acting anesthetic    Hemostasis: Mid calf tourniquet plated to 200 mmHg for approximately 45 minutes    Specimen: None    Complications: None    Operative indications:Matthew Webster is an 74 y.o. that presents today for surgical intervention.  The risks/benefits/alternatives/complications have been discussed and consent has been given.    Procedure:  Patient was brought into the OR and placed on the operating table in the supine position. After anesthesia was obtained theleft lower extremity was prepped and draped in usual sterile fashion.  Attention was initially directed to the dorsal medial aspect of the first MTPJ where an incision was made.  Sharp and blunt dissection carried down to the capsule.  Longitudinal capsulotomy was performed.  This exposed the metatarsophalangeal joint.  There was noted to be a large loose body on the dorsal joint and this was excised.  A large amount of para-articular spurring was noted dorsal medial and lateral and this was then excised with a combination of a saw, rasp, and rongeur.  This was taken down to a much more anatomic joint.  There was noted to be loss of articular cartilage at the head of the metatarsal without erosive changes in the region.  Large amount of thick synovitis was also noted and excised.  This wound was flushed with copious amounts of irrigation.  Attention was then directed to  the dorsal aspect of the midfoot along the 1st and 2nd met cuneiform joint.  A dorsal incision was made overlying the extensor hallucis longus tendon.  The tendon was retracted medially.  Subperiosteal dissection was then performed exposing the entire exostosis.  With a combination of a power saw, power rasp, and rongeur I was able to remove the spurring.  Initially large spur from the first metatarsal cuneiform joint was excised.  Next the spur from the second metatarsal cuneiform joint was excised.  This was then smoothed.  Fluoroscopy was used intraoperatively to confirm excision of the exostosis from all areas.  After final flush the wounds were then closed with 3-0 Vicryl the deeper and subcutaneous tissue and a 4-0 Monocryl for the skin.  A bulky sterile dressing was applied.    Patient tolerated the procedure and anesthesia well.  Was transported from the OR to the PACU with all vital signs stable and vascular status intact. To be discharged per routine protocol.  Will follow up in approximately 1 week in the outpatient clinic.

## 2024-03-03 NOTE — Anesthesia Postprocedure Evaluation (Signed)
"   Anesthesia Post Note  Patient: [name]  Procedure(s) Performed: EXCISION, METATARSAL BONE, HEAD (Left: Toe) CHEILECTOMY (Left: Foot)  Patient location during evaluation: PACU Anesthesia Type: General Level of consciousness: awake and alert Pain management: pain level controlled Vital Signs Assessment: post-procedure vital signs reviewed and stable Respiratory status: spontaneous breathing, nonlabored ventilation, respiratory function stable and patient connected to nasal cannula oxygen Cardiovascular status: blood pressure returned to baseline and stable Postop Assessment: no apparent nausea or vomiting Anesthetic complications: no   No notable events documented.   Last Vitals:  Vitals:   03/03/24 1108 03/03/24 1335  BP: (!) 152/90 137/86  Pulse: 67   Resp: 18   Temp: 37 C 36.6 C  SpO2: 95%     Last Pain:  Vitals:   03/03/24 1108  TempSrc: Temporal  PainSc: 0-No pain                 Fairy A Kamika Goodloe      "

## 2024-03-03 NOTE — Anesthesia Preprocedure Evaluation (Signed)
"                                    Anesthesia Evaluation  Patient identified by MRN, date of birth, ID band Patient awake    Reviewed: Allergy & Precautions, H&P , NPO status , Patient's Chart, lab work & pertinent test results  Airway Mallampati: II  TM Distance: >3 FB Neck ROM: Full    Dental no notable dental hx.    Pulmonary neg pulmonary ROS   Pulmonary exam normal breath sounds clear to auscultation       Cardiovascular hypertension, negative cardio ROS Normal cardiovascular exam Rhythm:Regular Rate:Normal     Neuro/Psych negative neurological ROS  negative psych ROS   GI/Hepatic negative GI ROS, Neg liver ROS,,,  Endo/Other  negative endocrine ROS    Renal/GU negative Renal ROS  negative genitourinary   Musculoskeletal negative musculoskeletal ROS (+)    Abdominal   Peds negative pediatric ROS (+)  Hematology negative hematology ROS (+)   Anesthesia Other Findings   Reproductive/Obstetrics negative OB ROS                              Anesthesia Physical Anesthesia Plan  ASA: 3  Anesthesia Plan: General   Post-op Pain Management:    Induction: Intravenous  PONV Risk Score and Plan:   Airway Management Planned: LMA  Additional Equipment:   Intra-op Plan:   Post-operative Plan: Extubation in OR  Informed Consent: I have reviewed the patients History and Physical, chart, labs and discussed the procedure including the risks, benefits and alternatives for the proposed anesthesia with the patient or authorized representative who has indicated his/her understanding and acceptance.     Dental advisory given  Plan Discussed with: CRNA  Anesthesia Plan Comments:         Anesthesia Quick Evaluation  "

## 2024-03-03 NOTE — Discharge Instructions (Signed)

## 2024-03-03 NOTE — Anesthesia Procedure Notes (Signed)
 Procedure Name: LMA Insertion Date/Time: 03/03/2024 12:21 PM  Performed by: Levy Harvey, CRNAPre-anesthesia Checklist: Patient identified, Emergency Drugs available, Suction available, Timeout performed and Patient being monitored Patient Re-evaluated:Patient Re-evaluated prior to induction Oxygen Delivery Method: Circle system utilized Preoxygenation: Pre-oxygenation with 100% oxygen Induction Type: IV induction LMA: LMA inserted LMA Size: 4.0 and 5.0 Number of attempts: 1 Placement Confirmation: positive ETCO2 and breath sounds checked- equal and bilateral Tube secured with: Tape

## 2024-03-30 ENCOUNTER — Ambulatory Visit: Admit: 2024-03-30 | Admitting: Internal Medicine

## 2024-04-05 ENCOUNTER — Ambulatory Visit: Admitting: Nurse Practitioner

## 2024-05-04 ENCOUNTER — Ambulatory Visit: Admitting: Dermatology

## 2024-06-07 ENCOUNTER — Other Ambulatory Visit

## 2024-06-09 ENCOUNTER — Ambulatory Visit: Admitting: Urology

## 2024-07-07 ENCOUNTER — Ambulatory Visit: Admitting: Nurse Practitioner

## 2024-09-01 ENCOUNTER — Other Ambulatory Visit

## 2024-09-08 ENCOUNTER — Ambulatory Visit: Admitting: Urology

## 2024-09-29 ENCOUNTER — Other Ambulatory Visit
